# Patient Record
Sex: Female | Born: 1955 | Race: White | Hispanic: No | Smoking: Never smoker
Health system: Southern US, Community
[De-identification: ages and names within clinical notes are randomized; demographics above are authoritative.]

## PROBLEM LIST (undated history)

## (undated) DIAGNOSIS — J189 Pneumonia, unspecified organism: Secondary | ICD-10-CM

## (undated) DIAGNOSIS — K760 Fatty (change of) liver, not elsewhere classified: Secondary | ICD-10-CM

## (undated) DIAGNOSIS — R519 Headache, unspecified: Secondary | ICD-10-CM

## (undated) DIAGNOSIS — IMO0002 Reserved for concepts with insufficient information to code with codable children: Secondary | ICD-10-CM

## (undated) DIAGNOSIS — Z86718 Personal history of other venous thrombosis and embolism: Secondary | ICD-10-CM

## (undated) DIAGNOSIS — R0600 Dyspnea, unspecified: Secondary | ICD-10-CM

## (undated) DIAGNOSIS — I509 Heart failure, unspecified: Secondary | ICD-10-CM

## (undated) DIAGNOSIS — M8580 Other specified disorders of bone density and structure, unspecified site: Secondary | ICD-10-CM

## (undated) DIAGNOSIS — I739 Peripheral vascular disease, unspecified: Secondary | ICD-10-CM

## (undated) DIAGNOSIS — N189 Chronic kidney disease, unspecified: Secondary | ICD-10-CM

## (undated) DIAGNOSIS — Z8679 Personal history of other diseases of the circulatory system: Secondary | ICD-10-CM

## (undated) DIAGNOSIS — D649 Anemia, unspecified: Secondary | ICD-10-CM

## (undated) DIAGNOSIS — E119 Type 2 diabetes mellitus without complications: Secondary | ICD-10-CM

## (undated) DIAGNOSIS — A319 Mycobacterial infection, unspecified: Secondary | ICD-10-CM

## (undated) DIAGNOSIS — M329 Systemic lupus erythematosus, unspecified: Secondary | ICD-10-CM

## (undated) DIAGNOSIS — M1990 Unspecified osteoarthritis, unspecified site: Secondary | ICD-10-CM

## (undated) DIAGNOSIS — E785 Hyperlipidemia, unspecified: Secondary | ICD-10-CM

## (undated) DIAGNOSIS — M3500 Sicca syndrome, unspecified: Secondary | ICD-10-CM

## (undated) DIAGNOSIS — I10 Essential (primary) hypertension: Secondary | ICD-10-CM

## (undated) HISTORY — DX: Sjogren syndrome, unspecified: M35.00

## (undated) HISTORY — DX: Headache, unspecified: R51.9

## (undated) HISTORY — PX: SKIN BIOPSY: SHX1

## (undated) HISTORY — PX: ABDOMINAL HYSTERECTOMY: SHX81

## (undated) HISTORY — DX: Reserved for concepts with insufficient information to code with codable children: IMO0002

## (undated) HISTORY — DX: Systemic lupus erythematosus, unspecified: M32.9

## (undated) HISTORY — PX: EYE SURGERY: SHX253

## (undated) HISTORY — PX: OTHER SURGICAL HISTORY: SHX169

## (undated) HISTORY — DX: Hyperlipidemia, unspecified: E78.5

---

## 2013-10-14 HISTORY — PX: SINUS SURGERY WITH INSTATRAK: SHX5215

## 2015-05-26 HISTORY — PX: COLONOSCOPY: SHX174

## 2016-06-12 ENCOUNTER — Telehealth: Payer: Self-pay

## 2016-06-12 DIAGNOSIS — R062 Wheezing: Secondary | ICD-10-CM | POA: Insufficient documentation

## 2016-06-12 DIAGNOSIS — R0602 Shortness of breath: Secondary | ICD-10-CM | POA: Insufficient documentation

## 2016-06-12 DIAGNOSIS — E785 Hyperlipidemia, unspecified: Secondary | ICD-10-CM | POA: Insufficient documentation

## 2016-06-12 DIAGNOSIS — R609 Edema, unspecified: Secondary | ICD-10-CM | POA: Insufficient documentation

## 2016-06-12 DIAGNOSIS — H9203 Otalgia, bilateral: Secondary | ICD-10-CM

## 2016-06-12 DIAGNOSIS — R630 Anorexia: Secondary | ICD-10-CM | POA: Insufficient documentation

## 2016-06-12 DIAGNOSIS — H9209 Otalgia, unspecified ear: Secondary | ICD-10-CM | POA: Insufficient documentation

## 2016-06-13 ENCOUNTER — Encounter (INDEPENDENT_AMBULATORY_CARE_PROVIDER_SITE_OTHER): Payer: Self-pay

## 2016-06-13 ENCOUNTER — Ambulatory Visit (INDEPENDENT_AMBULATORY_CARE_PROVIDER_SITE_OTHER): Payer: 59 | Admitting: Cardiovascular Disease

## 2016-06-13 ENCOUNTER — Encounter: Payer: Self-pay | Admitting: Cardiovascular Disease

## 2016-06-13 VITALS — BP 124/80 | HR 89 | Ht 65.0 in | Wt 233.0 lb

## 2016-06-13 DIAGNOSIS — I509 Heart failure, unspecified: Secondary | ICD-10-CM

## 2016-06-13 DIAGNOSIS — R0602 Shortness of breath: Secondary | ICD-10-CM

## 2016-06-13 NOTE — Patient Instructions (Signed)
Your physician recommends that you schedule a follow-up appointment in:  After echo and we get records from Dr Raynelle Jan   Your physician has requested that you have an echocardiogram. Echocardiography is a painless test that uses sound waves to create images of your heart. It provides your doctor with information about the size and shape of your heart and how well your heart's chambers and valves are working. This procedure takes approximately one hour. There are no restrictions for this procedure.      Thank you for choosing [facility] !

## 2016-06-13 NOTE — Progress Notes (Signed)
Cardiology Office Note   Date:  06/13/2016   ID:  Joy Leach, DOB August 01, 1956, MRN [id number]  PCP:  No primary care provider on file.  Cardiologist:   Jenkins Rouge, MD   No chief complaint on file.     History of Present Illness: Joy Leach is a 60 y.o. female who presents for evaluation of edema, dyspnea and ? CHF.  Been ill since Beginning of month saw primary with CXR showing pulmonary congestion and small left pleural effusion. Started On lasix with some improvement.   Labs remarkable for cr 1.0 BUN 15 K 3.7 Albumin 4.3 LDL 44 TSH 3.5 A1c 6   She has seen Dr Joy Leach for Rheumatology and indicates she has lupus but her sister said Lab work was normal. She does get "butterfly" rash on face in sun. Knee joints hurt the most  No chest pain palpitations or syncope. No previous history of MI valve disease no diet medication Diet with moderate salt Started prednisone and plaquenil for "Lupus" in March       Past Medical History:  Diagnosis Date  . Hyperlipidemia     No past surgical history on file.   Current Outpatient Prescriptions  Medication Sig Dispense Refill  . atorvastatin (LIPITOR) 10 MG tablet Take 10 mg by mouth daily.    . Cholecalciferol (VITAMIN D3) 3000 units TABS Take 50,000 Units by mouth daily.    . furosemide (LASIX) 20 MG tablet Take 20 mg by mouth daily.    Marland Kitchen loperamide (IMODIUM A-D) 2 MG tablet Take 2 mg by mouth 2 (two) times daily.    Marland Kitchen losartan (COZAAR) 50 MG tablet Take 50 mg by mouth daily.    . Multiple Vitamin (MULTIVITAMIN) tablet Take 1 tablet by mouth daily.    . Omega-3 Fatty Acids (FISH OIL) 1000 MG CAPS Take 1,000 mg by mouth 2 (two) times daily.    Marland Kitchen omeprazole (PRILOSEC) 20 MG capsule Take 20 mg by mouth daily.    . pilocarpine (SALAGEN) 5 MG tablet Take 5 mg by mouth 3 (three) times daily.    . predniSONE (DELTASONE) 10 MG tablet Take 10 mg by mouth daily with breakfast.     No current facility-administered medications  for this visit.     Allergies:   Acetaminophen    Social History:   The patient is single Here with her sisters. Lives in [place] Sedentary No smoking or ETOH    Family History:  The patient's no history of DCM CHF or Lupus on either side of family is    ROS:  Please see the history of present illness.   Otherwise, review of systems are positive for none.   All other systems are reviewed and negative.    PHYSICAL EXAM: VS:  There were no vitals taken for this visit. , BMI There is no height or weight on file to calculate BMI. Affect appropriate Obese white female  HEENT: normal Neck supple with no adenopathy JVP normal no bruits no thyromegaly Lungs clear with no wheezing and good diaphragmatic motion Heart:  S1/S2 no murmur, no rub, gallop or click PMI normal Abdomen: benighn, BS positve, no tenderness, no AAA no bruit.  No HSM or HJR Distal pulses intact with no bruits Trace edema Neuro non-focal Skin warm and dry No muscular weakness    EKG:   SR rate 86  Normal    Recent Labs: No results found for requested labs within last 8760 hours.    Lipid  Panel No results found for: CHOL, TRIG, HDL, CHOLHDL, VLDL, LDLCALC, LDLDIRECT    Wt Readings from Last 3 Encounters:  05/21/16 229 lb 6.4 oz (104.1 kg)      Other studies Reviewed: Additional studies/ records that were reviewed today include: Notes Dr Joy Leach in [place]:  1.  Edema:  Etiology not clear.  Obesity and beginning prednisone can contribute. Exam and ECG are normal Will order echo to evaluate systolic and diastolic function and assess PA pressure 2. Connective Tissue Dx:  ? Lupus will try to get blood work from rheumatologist.  Certainly could contribute to Diastolic dysfunction. She is a large woman and very claustrophobic so would not be a candidate for cardiac MRI At this time. Try to decrease prednisone dose and consider referall to tertiary center for  definitive diagnosis  3.    Current medicines are reviewed at length with the patient today.  The patient does not have concerns regarding medicines.  The following changes have been made:  no change  Labs/ tests ordered today include: Echo  No orders of the defined types were placed in this encounter.    Disposition:   FU with NP in [place] post echo and after records from rheum received Dr Joy Leach     Signed, Jenkins Rouge, MD  06/13/2016 10:05 AM    Joy Leach, [place], [place]  [address] Phone: [phone]; Fax: [phone]

## 2016-06-28 ENCOUNTER — Ambulatory Visit (HOSPITAL_COMMUNITY)
Admission: RE | Admit: 2016-06-28 | Discharge: 2016-06-28 | Disposition: A | Discharge status: Home / Self Care | Payer: 59 | Source: Ambulatory Visit | Attending: Cardiovascular Disease | Admitting: Cardiovascular Disease

## 2016-06-28 DIAGNOSIS — I509 Heart failure, unspecified: Secondary | ICD-10-CM | POA: Diagnosis not present

## 2016-06-28 DIAGNOSIS — I340 Nonrheumatic mitral (valve) insufficiency: Secondary | ICD-10-CM | POA: Diagnosis not present

## 2016-06-28 NOTE — Progress Notes (Signed)
  Echocardiogram 2D Echocardiogram has been performed.  Darlina Sicilian M 06/28/2016, 9:59 AM

## 2016-07-01 ENCOUNTER — Telehealth: Payer: Self-pay | Admitting: Cardiovascular Disease

## 2016-07-01 NOTE — Telephone Encounter (Signed)
Pt is returning someone's call.

## 2016-07-01 NOTE — Telephone Encounter (Signed)
[facility] attempted to call pt,she will call them

## 2016-07-12 ENCOUNTER — Encounter: Payer: Self-pay | Admitting: Adult Health

## 2016-07-12 ENCOUNTER — Ambulatory Visit (INDEPENDENT_AMBULATORY_CARE_PROVIDER_SITE_OTHER): Payer: 59 | Admitting: Adult Health

## 2016-07-12 VITALS — BP 132/82 | HR 95 | Ht 65.0 in | Wt 234.0 lb

## 2016-07-12 DIAGNOSIS — I10 Essential (primary) hypertension: Secondary | ICD-10-CM

## 2016-07-12 DIAGNOSIS — E7800 Pure hypercholesterolemia, unspecified: Secondary | ICD-10-CM | POA: Diagnosis not present

## 2016-07-12 DIAGNOSIS — R0602 Shortness of breath: Secondary | ICD-10-CM

## 2016-07-12 NOTE — Patient Instructions (Signed)
Medication Instructions:  TAKE LASIX AS NEEDED ( Tuesday/FRIDAY )   Labwork: Your physician recommends that you return for lab work in: TODAY  BMET TSH MAGNESIUM    Testing/Procedures: Your physician has recommended that you have a pulmonary function test. Pulmonary Function Tests are a group of tests that measure how well air moves in and out of your lungs.   Your physician has recommended that you have a sleep study. This test records several body functions during sleep, including: brain activity, eye movement, oxygen and carbon dioxide blood levels, heart rate and rhythm, breathing rate and rhythm, the flow of air through your mouth and nose, snoring, body muscle movements, and chest and belly movement.    Follow-Up: Your physician recommends that you schedule a follow-up appointment in: 1 MONTH    Any Other Special Instructions Will Be Listed Below (If Applicable).     If you need a refill on your cardiac medications before your next appointment, please call your pharmacy.

## 2016-07-12 NOTE — Progress Notes (Signed)
Cardiology Office Note   Date:  07/12/2016   ID:  Joy Leach, DOB 1956/03/08, MRN [id number]  PCP:  PROVIDER NOT IN SYSTEM  Cardiologist: Nishan/  Jory Sims, NP   Chief Complaint  Patient presents with  . Congestive Heart Failure  . Shortness of Breath      History of Present Illness: Joy Leach is a 60 y.o. female who presents for ongoing assessment and management of dyspnea, questional CHF with complaints of edema. The patient was last seen by Dr. Johnsie Cancel on 06/13/2016 with an echocardiogram planned. It is recommended that she decrease her prednisone dosing but has been deferred to her rheumatologist to make this adjustments.  Left ventricle: The cavity size was normal. Wall thickness was   normal. Doppler parameters are consistent with abnormal left   ventricular relaxation (grade 1 diastolic dysfunction). - Mitral valve: There was mild regurgitation.  Joy Leach, comes today tearful and frustrated. She continues to have complaints of dyspnea on exertion and mild chest pressure. She states that she does not understand why all of her tests are coming back negative and no one has found out what is causing her symptoms. She is with her family members who are also frustrated.  Past Medical History:  Diagnosis Date  . Hyperlipidemia     History reviewed. No pertinent surgical history.   Current Outpatient Prescriptions  Medication Sig Dispense Refill  . atorvastatin (LIPITOR) 10 MG tablet Take 10 mg by mouth daily.    . calcium-vitamin D (OSCAL WITH D) 500-200 MG-UNIT tablet Take 1 tablet by mouth.    . Cholecalciferol (VITAMIN D3) 3000 units TABS Take 50,000 Units by mouth daily.    . furosemide (LASIX) 20 MG tablet Take 20 mg by mouth daily.    . hydroxychloroquine (PLAQUENIL) 200 MG tablet Take 400 mg by mouth daily.    Marland Kitchen loperamide (IMODIUM A-D) 2 MG tablet Take 2 mg by mouth 2 (two) times daily.    Marland Kitchen losartan (COZAAR) 50 MG tablet Take 50 mg by mouth daily.     . Multiple Vitamin (MULTIVITAMIN) tablet Take 1 tablet by mouth daily.    . Omega-3 Fatty Acids (FISH OIL) 1000 MG CAPS Take 1,000 mg by mouth 2 (two) times daily.    Marland Kitchen omeprazole (PRILOSEC) 20 MG capsule Take 20 mg by mouth daily.    . pilocarpine (SALAGEN) 5 MG tablet Take 5 mg by mouth 3 (three) times daily.    . predniSONE (DELTASONE) 10 MG tablet Take 10 mg by mouth daily with breakfast.     No current facility-administered medications for this visit.     Allergies:   Sulfa antibiotics and Codeine    Social History:  The patient  reports that she has never smoked. She has never used smokeless tobacco. She reports that she does not drink alcohol or use drugs.   Family History:  The patient's family history includes Diabetes in her father and mother; Heart disease in her father and mother; Hypertension in her father and mother; Kidney disease in her father and mother.    ROS: All other systems are reviewed and negative. Unless otherwise mentioned in H&P    PHYSICAL EXAM: VS:  BP 132/82   Pulse 95   Ht 5\' 5"  (1.651 m)   Wt 234 lb (106.1 kg)   SpO2 96%   BMI 38.94 kg/m  , BMI Body mass index is 38.94 kg/m. GEN: Well nourished, well developed, in no acute distress  HEENT: normal  Neck:  no JVD, carotid bruits, or masses Cardiac: RRR;tachycardic, no murmurs, rubs, or gallops, mild, nonpitting edema  Respiratory: Clear to auscultation bilaterally, normal work of breathing GI: soft, nontender, nondistended, + BS MS: no deformity or atrophy  Skin: warm and dry, no rash Neuro:  Strength and sensation are intact Psych: euthymic mood, full affect  Recent Labs: No results found for requested labs within last 8760 hours.    Lipid Panel No results found for: CHOL, TRIG, HDL, CHOLHDL, VLDL, LDLCALC, LDLDIRECT    Wt Readings from Last 3 Encounters:  07/12/16 234 lb (106.1 kg)  06/13/16 233 lb (105.7 kg)  05/21/16 229 lb 6.4 oz (104.1 kg)     ASSESSMENT AND PLAN:  1.   Chronic dyspnea: she continues to have generalized fatigue and dyspnea with minimal exertion. Echo is reassuring. I will schedule her for pulmonary function tests and a sleep study. She snores heavily, is often fatigued, and despite multiple testing she has been ruled out for cardiac etiology for her breathing insufficiency.  2. Hypertension:blood pressures currently well-controlled. Will not make any changes. We'll order a BMET and a magnesium along with a TSH.she will use Lasix when necessary assisted daily.  3. Hyperlipidemia:he patient will continue statin therapy.  4. Questionable Lupus:  She is being followed by rheumatologist for ongoing evaluation and management. Remains on prednisone.   Current medicines are reviewed at length with the patient today.    Labs/ tests ordered today include: BMET, magnesium, TSH, sleep study, and PFTs No orders of the defined types were placed in this encounter.    Disposition:   FU with one month.  Signed, Jory Sims, NP  07/12/2016 1:29 PM    [facility] [address] [place], [place] [address] Phone: [phone]; Fax: [phone]

## 2016-07-12 NOTE — Progress Notes (Signed)
Name: Devani Rain    DOB: August 27, 1956  Age: 60 y.o.  MR#: [id number]       PCP:  PROVIDER NOT IN SYSTEM      Insurance: Payor: [employer] / Plan: [employer] OTHER / Product Type: *No Product type* /   CC:   No chief complaint on file.   VS Vitals:   07/12/16 1321  Pulse: 95  SpO2: 96%  Weight: 234 lb (106.1 kg)  Height: 5\' 5"  (1.651 m)    Weights Current Weight  07/12/16 234 lb (106.1 kg)  06/13/16 233 lb (105.7 kg)  05/21/16 229 lb 6.4 oz (104.1 kg)    Blood Pressure  BP Readings from Last 3 Encounters:  06/13/16 124/80  05/21/16 124/68     Admit date:  (Not on file) Last encounter with RMR:  Visit date not found   Allergy Sulfa antibiotics and Codeine  Current Outpatient Prescriptions  Medication Sig Dispense Refill  . atorvastatin (LIPITOR) 10 MG tablet Take 10 mg by mouth daily.    . calcium-vitamin D (OSCAL WITH D) 500-200 MG-UNIT tablet Take 1 tablet by mouth.    . Cholecalciferol (VITAMIN D3) 3000 units TABS Take 50,000 Units by mouth daily.    . furosemide (LASIX) 20 MG tablet Take 20 mg by mouth daily.    . hydroxychloroquine (PLAQUENIL) 200 MG tablet Take 400 mg by mouth daily.    Marland Kitchen loperamide (IMODIUM A-D) 2 MG tablet Take 2 mg by mouth 2 (two) times daily.    Marland Kitchen losartan (COZAAR) 50 MG tablet Take 50 mg by mouth daily.    . Multiple Vitamin (MULTIVITAMIN) tablet Take 1 tablet by mouth daily.    . Omega-3 Fatty Acids (FISH OIL) 1000 MG CAPS Take 1,000 mg by mouth 2 (two) times daily.    Marland Kitchen omeprazole (PRILOSEC) 20 MG capsule Take 20 mg by mouth daily.    . pilocarpine (SALAGEN) 5 MG tablet Take 5 mg by mouth 3 (three) times daily.    . predniSONE (DELTASONE) 10 MG tablet Take 10 mg by mouth daily with breakfast.     No current facility-administered medications for this visit.     Discontinued Meds:   There are no discontinued medications.  Patient Active Problem List   Diagnosis Date Noted  . Hyperlipidemia 06/12/2016  . Anorexia  06/12/2016  . Edema 06/12/2016  . Otalgia 06/12/2016  . Wheezing 06/12/2016  . SOB (shortness of breath) 06/12/2016    LABS No results found for: NA, K, CL, CO2, GLUCOSE, BUN, CREATININE, CALCIUM, GFRNONAA, GFRAA CMP  No results found for: NA, K, CL, CO2, GLUCOSE, BUN, CREATININE, CALCIUM, PROT, ALBUMIN, AST, ALT, ALKPHOS, BILITOT, GFRNONAA, GFRAA  No results found for: WBC, HGB, HCT, MCV  Lipid Panel  No results found for: CHOL, TRIG, HDL, CHOLHDL, VLDL, LDLCALC, LDLDIRECT  ABG No results found for: PHART, PCO2ART, PO2ART, HCO3, TCO2, ACIDBASEDEF, O2SAT   No results found for: TSH BNP (last 3 results) No results for input(s): BNP in the last 8760 hours.  ProBNP (last 3 results) No results for input(s): PROBNP in the last 8760 hours.  Cardiac Panel (last 3 results) No results for input(s): CKTOTAL, CKMB, TROPONINI, RELINDX in the last 72 hours.  Iron/TIBC/Ferritin/ %Sat No results found for: IRON, TIBC, FERRITIN, IRONPCTSAT   EKG Orders placed or performed in visit on 06/13/16  . EKG 12-Lead     Prior Assessment and Plan Problem List as of 07/12/2016 Reviewed: 06/12/2016  3:23 PM by Drema Dallas,  CMA     Other   Hyperlipidemia   Anorexia   Edema   Otalgia   Wheezing   SOB (shortness of breath)       Imaging: No results found.

## 2016-07-13 LAB — BASIC METABOLIC PANEL
BUN: 12 mg/dL (ref 7–25)
CO2: 26 mmol/L (ref 20–31)
Calcium: 9.5 mg/dL (ref 8.6–10.4)
Chloride: 102 mmol/L (ref 98–110)
Creat: 1.01 mg/dL (ref 0.50–1.05)
GLUCOSE: 145 mg/dL — AB (ref 65–99)
POTASSIUM: 4 mmol/L (ref 3.5–5.3)
Sodium: 141 mmol/L (ref 135–146)

## 2016-07-13 LAB — TSH: TSH: 1.13 m[IU]/L

## 2016-07-13 LAB — MAGNESIUM: MAGNESIUM: 2.2 mg/dL (ref 1.5–2.5)

## 2016-07-24 ENCOUNTER — Ambulatory Visit (HOSPITAL_COMMUNITY)
Admission: RE | Admit: 2016-07-24 | Discharge: 2016-07-24 | Disposition: A | Discharge status: Home / Self Care | Payer: 59 | Source: Ambulatory Visit | Attending: Adult Health | Admitting: Adult Health

## 2016-07-24 DIAGNOSIS — J988 Other specified respiratory disorders: Secondary | ICD-10-CM | POA: Diagnosis not present

## 2016-07-24 DIAGNOSIS — R0602 Shortness of breath: Secondary | ICD-10-CM | POA: Insufficient documentation

## 2016-07-24 LAB — PULMONARY FUNCTION TEST
DL/VA % PRED: 85 %
DL/VA: 4.19 ml/min/mmHg/L
DLCO unc % pred: 66 %
DLCO unc: 16.91 ml/min/mmHg
FEF 25-75 POST: 1.83 L/s
FEF 25-75 Pre: 2.02 L/sec
FEF2575-%CHANGE-POST: -9 %
FEF2575-%PRED-PRE: 82 %
FEF2575-%Pred-Post: 75 %
FEV1-%CHANGE-POST: -2 %
FEV1-%PRED-PRE: 67 %
FEV1-%Pred-Post: 65 %
FEV1-PRE: 1.8 L
FEV1-Post: 1.75 L
FEV1FVC-%CHANGE-POST: 3 %
FEV1FVC-%Pred-Pre: 108 %
FEV6-%CHANGE-POST: -6 %
FEV6-%PRED-PRE: 63 %
FEV6-%Pred-Post: 59 %
FEV6-PRE: 2.13 L
FEV6-Post: 1.99 L
FEV6FVC-%PRED-PRE: 103 %
FEV6FVC-%Pred-Post: 103 %
FVC-%CHANGE-POST: -6 %
FVC-%PRED-PRE: 61 %
FVC-%Pred-Post: 57 %
FVC-POST: 1.99 L
FVC-PRE: 2.13 L
POST FEV1/FVC RATIO: 88 %
POST FEV6/FVC RATIO: 100 %
Pre FEV1/FVC ratio: 85 %
Pre FEV6/FVC Ratio: 100 %
RV % pred: 98 %
RV: 2 L
TLC % pred: 91 %
TLC: 4.75 L

## 2016-07-24 MED ORDER — ALBUTEROL SULFATE (2.5 MG/3ML) 0.083% IN NEBU
2.5000 mg | INHALATION_SOLUTION | Freq: Once | RESPIRATORY_TRACT | Status: AC
  Administered 2016-07-24: 2.5 mg via RESPIRATORY_TRACT

## 2016-07-25 ENCOUNTER — Ambulatory Visit: Payer: 59 | Attending: Adult Health | Admitting: Neurology

## 2016-07-25 DIAGNOSIS — G4733 Obstructive sleep apnea (adult) (pediatric): Secondary | ICD-10-CM | POA: Insufficient documentation

## 2016-07-25 DIAGNOSIS — R0602 Shortness of breath: Secondary | ICD-10-CM

## 2016-07-25 DIAGNOSIS — Z79899 Other long term (current) drug therapy: Secondary | ICD-10-CM | POA: Diagnosis not present

## 2016-07-25 DIAGNOSIS — R0683 Snoring: Secondary | ICD-10-CM | POA: Diagnosis present

## 2016-07-26 ENCOUNTER — Telehealth: Payer: Self-pay

## 2016-07-26 NOTE — Telephone Encounter (Signed)
Called pt, no answer- left message for pt to return call.  

## 2016-07-26 NOTE — Telephone Encounter (Signed)
-----   Message from Lendon Colonel, NP sent at 07/25/2016  5:55 PM EDT ----- No significant respiratory abnormalities noted on PFTs. Follow up with PCP for ongoing testing and management.

## 2016-07-29 NOTE — Procedures (Signed)
Orland A. Merlene Laughter, MD     [email or web address]             NOCTURNAL POLYSOMNOGRAPHY   LOCATION: [facility]  Patient Name: Joy Leach, Joy Leach Date: 07/25/2016 Gender: Female D.O.B: Jul 07, 1956 Age (years): 68 Referring Provider: Jory Sims NP Height (inches): 65 Interpreting Physician: Phillips Odor MD, ABSM Weight (lbs): 234 RPSGT: Peak, Robert BMI: 39 MRN: [id number] Neck Size: 17.00 CLINICAL INFORMATION Sleep Study Type: NPSG Indication for sleep study: N/A Epworth Sleepiness Score: 5 SLEEP STUDY TECHNIQUE As per the AASM Manual for the Scoring of Sleep and Associated Events v2.3 (April 2016) with a hypopnea requiring 4% desaturations. The channels recorded and monitored were frontal, central and occipital EEG, electrooculogram (EOG), submentalis EMG (chin), nasal and oral airflow, thoracic and abdominal wall motion, anterior tibialis EMG, snore microphone, electrocardiogram, and pulse oximetry. MEDICATIONS Medications self-administered by patient taken the night of the study : N/A  Current Outpatient Prescriptions:  .  atorvastatin (LIPITOR) 10 MG tablet, Take 10 mg by mouth daily., Disp: , Rfl:  .  calcium-vitamin D (OSCAL WITH D) 500-200 MG-UNIT tablet, Take 1 tablet by mouth., Disp: , Rfl:  .  Cholecalciferol (VITAMIN D3) 3000 units TABS, Take 50,000 Units by mouth daily., Disp: , Rfl:  .  furosemide (LASIX) 20 MG tablet, Take 20 mg by mouth daily. DAILY AS NEEDED , Disp: , Rfl:  .  hydroxychloroquine (PLAQUENIL) 200 MG tablet, Take 400 mg by mouth daily., Disp: , Rfl:  .  loperamide (IMODIUM A-D) 2 MG tablet, Take 2 mg by mouth 2 (two) times daily., Disp: , Rfl:  .  losartan (COZAAR) 50 MG tablet, Take 50 mg by mouth daily., Disp: , Rfl:  .  Multiple Vitamin (MULTIVITAMIN) tablet, Take 1 tablet by mouth daily., Disp: , Rfl:  .  Omega-3 Fatty Acids (FISH OIL) 1000 MG CAPS, Take 1,000 mg by mouth 2 (two) times daily., Disp: , Rfl:  .   omeprazole (PRILOSEC) 20 MG capsule, Take 20 mg by mouth daily., Disp: , Rfl:  .  pilocarpine (SALAGEN) 5 MG tablet, Take 5 mg by mouth 3 (three) times daily., Disp: , Rfl:  .  predniSONE (DELTASONE) 10 MG tablet, Take 10 mg by mouth daily with breakfast., Disp: , Rfl:   SLEEP ARCHITECTURE The study was initiated at 10:07:37 PM and ended at 5:01:12 AM. Sleep onset time was 88.0 minutes and the sleep efficiency was 33.0%. The total sleep time was 136.5 minutes. Stage REM latency was N/A minutes. The patient spent 69.23% of the night in stage N1 sleep, 30.77% in stage N2 sleep, 0.00% in stage N3 and 0.00% in REM. Alpha intrusion was absent. Supine sleep was 54.95%. RESPIRATORY PARAMETERS The overall apnea/hypopnea index (AHI) was 5.7 per hour. There were 2 total apneas, including 0 obstructive, 2 central and 0 mixed apneas. There were 11 hypopneas and 63 RERAs. The AHI during Stage REM sleep was N/A per hour. AHI while supine was 10.4 per hour. The mean oxygen saturation was 92.46%. The minimum SpO2 during sleep was 86.00%. Loud snoring was noted during this study. CARDIAC DATA The 2 lead EKG demonstrated sinus rhythm. The mean heart rate was 69.67 beats per minute. Other EKG findings include: None. LEG MOVEMENT DATA The total PLMS were 0 with a resulting PLMS index of 0.00. Associated arousal with leg movement index was 0.0.    IMPRESSIONS - This recording reveals mild obstructive sleep apnea syndrome that does not require positive pressure treatment. - Abnormal sleep architecture  with poor sleep efficiency, absent slow-wave sleep, absent REM sleep and sleep fragmentation.   Delano Metz, MD Diplomate, American Board of Sleep Medicine.

## 2016-07-29 NOTE — Progress Notes (Signed)
Tribune A. Merlene Laughter, MD     [email or web address]             NOCTURNAL POLYSOMNOGRAPHY   LOCATION: [facility]  Patient Name: Joy Leach, Joy Leach Date: 07/25/2016 Gender: Female D.O.B: 11/08/1955 Age (years): 68 Referring Provider: Jory Sims NP Height (inches): 65 Interpreting Physician: Phillips Odor MD, ABSM Weight (lbs): 234 RPSGT: Peak, Robert BMI: 39 MRN: [id number] Neck Size: 17.00 CLINICAL INFORMATION Sleep Study Type: NPSG Indication for sleep study: N/A Epworth Sleepiness Score: 5 SLEEP STUDY TECHNIQUE As per the AASM Manual for the Scoring of Sleep and Associated Events v2.3 (April 2016) with a hypopnea requiring 4% desaturations. The channels recorded and monitored were frontal, central and occipital EEG, electrooculogram (EOG), submentalis EMG (chin), nasal and oral airflow, thoracic and abdominal wall motion, anterior tibialis EMG, snore microphone, electrocardiogram, and pulse oximetry. MEDICATIONS Medications self-administered by patient taken the night of the study : N/A  Current Outpatient Prescriptions:  .  atorvastatin (LIPITOR) 10 MG tablet, Take 10 mg by mouth daily., Disp: , Rfl:  .  calcium-vitamin D (OSCAL WITH D) 500-200 MG-UNIT tablet, Take 1 tablet by mouth., Disp: , Rfl:  .  Cholecalciferol (VITAMIN D3) 3000 units TABS, Take 50,000 Units by mouth daily., Disp: , Rfl:  .  furosemide (LASIX) 20 MG tablet, Take 20 mg by mouth daily. DAILY AS NEEDED , Disp: , Rfl:  .  hydroxychloroquine (PLAQUENIL) 200 MG tablet, Take 400 mg by mouth daily., Disp: , Rfl:  .  loperamide (IMODIUM A-D) 2 MG tablet, Take 2 mg by mouth 2 (two) times daily., Disp: , Rfl:  .  losartan (COZAAR) 50 MG tablet, Take 50 mg by mouth daily., Disp: , Rfl:  .  Multiple Vitamin (MULTIVITAMIN) tablet, Take 1 tablet by mouth daily., Disp: , Rfl:  .  Omega-3 Fatty Acids (FISH OIL) 1000 MG CAPS, Take 1,000 mg by mouth 2 (two) times daily., Disp: , Rfl:  .   omeprazole (PRILOSEC) 20 MG capsule, Take 20 mg by mouth daily., Disp: , Rfl:  .  pilocarpine (SALAGEN) 5 MG tablet, Take 5 mg by mouth 3 (three) times daily., Disp: , Rfl:  .  predniSONE (DELTASONE) 10 MG tablet, Take 10 mg by mouth daily with breakfast., Disp: , Rfl:   SLEEP ARCHITECTURE The study was initiated at 10:07:37 PM and ended at 5:01:12 AM. Sleep onset time was 88.0 minutes and the sleep efficiency was 33.0%. The total sleep time was 136.5 minutes. Stage REM latency was N/A minutes. The patient spent 69.23% of the night in stage N1 sleep, 30.77% in stage N2 sleep, 0.00% in stage N3 and 0.00% in REM. Alpha intrusion was absent. Supine sleep was 54.95%. RESPIRATORY PARAMETERS The overall apnea/hypopnea index (AHI) was 5.7 per hour. There were 2 total apneas, including 0 obstructive, 2 central and 0 mixed apneas. There were 11 hypopneas and 63 RERAs. The AHI during Stage REM sleep was N/A per hour. AHI while supine was 10.4 per hour. The mean oxygen saturation was 92.46%. The minimum SpO2 during sleep was 86.00%. Loud snoring was noted during this study. CARDIAC DATA The 2 lead EKG demonstrated sinus rhythm. The mean heart rate was 69.67 beats per minute. Other EKG findings include: None. LEG MOVEMENT DATA The total PLMS were 0 with a resulting PLMS index of 0.00. Associated arousal with leg movement index was 0.0.   IMPRESSIONS - This recording reveals mild obstructive sleep apnea syndrome that does not require positive pressure treatment. - Abnormal sleep architecture with  poor sleep efficiency, absent slow-wave sleep, absent REM sleep and sleep fragmentation.   Delano Metz, MD Diplomate, American Board of Sleep Medicine.

## 2016-08-12 ENCOUNTER — Ambulatory Visit: Payer: 59 | Admitting: Adult Health

## 2016-08-12 ENCOUNTER — Telehealth: Payer: Self-pay | Admitting: Adult Health

## 2016-08-12 NOTE — Telephone Encounter (Signed)
Pt would like to know her sleep study results

## 2016-08-12 NOTE — Telephone Encounter (Signed)
Pt calling for sleep study,will inform requesting provider

## 2016-08-12 NOTE — Telephone Encounter (Signed)
Per NP study completed,not read, will call dr Freddie Apley office tomorrow to check

## 2016-08-13 ENCOUNTER — Telehealth: Payer: Self-pay

## 2016-08-13 NOTE — Telephone Encounter (Signed)
-----   Message from Lendon Colonel, NP sent at 08/12/2016  4:17 PM EDT ----- Regarding: Sleep study I see that sleep study completed. Has not been read yet. As soon as available will call results

## 2016-08-13 NOTE — Telephone Encounter (Signed)
Results under procedures in EPIC

## 2016-08-13 NOTE — Telephone Encounter (Signed)
LM with Raquel Sarna ,office manager to get results of sleep study

## 2016-08-19 NOTE — Progress Notes (Signed)
Cardiology Office Note   Date:  08/20/2016   ID:  Joy Leach, DOB 02-10-1956, MRN [id number]  PCP:  PROVIDER NOT IN SYSTEM  Cardiologist: Tramaine Sauls/  Jenkins Rouge, MD   No chief complaint on file.     History of Present Illness: Joy Leach is a 60 y.o. female who presents for ongoing assessment and management of dyspnea, questional CHF with complaints of edema. The patient was last seen by me on 06/13/2016 with an echocardiogram planned. It is recommended that she decrease her prednisone dosing but has been deferred to her rheumatologist to make this adjustments.  Left ventricle: The cavity size was normal. Wall thickness was   normal. Doppler parameters are consistent with abnormal left   ventricular relaxation (grade 1 diastolic dysfunction). - Mitral valve: There was mild regurgitation.  Sleep study showed mild obstructive sleep apnea that does not require  CPAP Still dyspnea and frustrated at not feeling well   Past Medical History:  Diagnosis Date  . Hyperlipidemia     Past Surgical History:  Procedure Laterality Date  . hystorectomy    . SINUS SURGERY WITH INSTATRAK       Current Outpatient Prescriptions  Medication Sig Dispense Refill  . atorvastatin (LIPITOR) 10 MG tablet Take 10 mg by mouth daily.    . calcium-vitamin D (OSCAL WITH D) 500-200 MG-UNIT tablet Take 1 tablet by mouth.    . Cholecalciferol (VITAMIN D3) 3000 units TABS Take 50,000 Units by mouth daily.    . furosemide (LASIX) 20 MG tablet Take 1 tablet (20 mg total) by mouth daily. DAILY AS NEEDED 30 tablet 3  . hydroxychloroquine (PLAQUENIL) 200 MG tablet Take 400 mg by mouth daily.    Marland Kitchen loperamide (IMODIUM A-D) 2 MG tablet Take 2 mg by mouth 2 (two) times daily.    Marland Kitchen losartan (COZAAR) 50 MG tablet Take 1 tablet (50 mg total) by mouth daily. 30 tablet 3  . Multiple Vitamin (MULTIVITAMIN) tablet Take 1 tablet by mouth daily.    . Omega-3 Fatty Acids (FISH OIL) 1000 MG CAPS Take 1,000 mg by mouth 2  (two) times daily.    Marland Kitchen omeprazole (PRILOSEC) 20 MG capsule Take 20 mg by mouth daily.    . pilocarpine (SALAGEN) 5 MG tablet Take 5 mg by mouth 3 (three) times daily.    . predniSONE (DELTASONE) 10 MG tablet Take 10 mg by mouth daily with breakfast.     No current facility-administered medications for this visit.     Allergies:   Sulfa antibiotics and Codeine    Social History:  The patient  reports that she has never smoked. She has never used smokeless tobacco. She reports that she does not drink alcohol or use drugs.   Family History:  The patient's family history includes Diabetes in her father and mother; Heart disease in her father and mother; Hypertension in her father and mother; Kidney disease in her father and mother.    ROS: All other systems are reviewed and negative. Unless otherwise mentioned in H&P    PHYSICAL EXAM: VS:  BP 138/88   Pulse (!) 110   Ht 5\' 5"  (1.651 m)   Wt 105.7 kg (233 lb)   SpO2 95%   BMI 38.77 kg/m  , BMI Body mass index is 38.77 kg/m. GEN: Well nourished, well developed, in no acute distress  HEENT: normal  Neck: no JVD, carotid bruits, or masses Cardiac: RRR;tachycardic, no murmurs, rubs, or gallops, mild, nonpitting edema  Respiratory: Clear to  auscultation bilaterally, normal work of breathing GI: soft, nontender, nondistended, + BS MS: no deformity or atrophy  Skin: warm and dry, no rash Neuro:  Strength and sensation are intact Psych: euthymic mood, full affect  Recent Labs: 07/12/2016: BUN 12; Creat 1.01; Magnesium 2.2; Potassium 4.0; Sodium 141; TSH 1.13    Lipid Panel No results found for: CHOL, TRIG, HDL, CHOLHDL, VLDL, LDLCALC, LDLDIRECT    Wt Readings from Last 3 Encounters:  08/20/16 105.7 kg (233 lb)  07/25/16 106.1 kg (234 lb)  07/12/16 106.1 kg (234 lb)     ASSESSMENT AND PLAN:  1.  Chronic dyspnea: she continues to have generalized fatigue and dyspnea with minimal exertion. . She snores heavily, is often  fatigued, and despite multiple testing she has been ruled out for cardiac etiology for her breathing insufficiency. Will order CTA to r/o PE and stress myovue to r/o CAD and dyspnea as anginal equivalent  2. Hypertension:blood pressures currently well-controlled. Will not make any changes.   3. Hyperlipidemia:he patient will continue statin therapy.  4. Questionable Lupus:  She is being followed by rheumatologist for ongoing evaluation and management. Remains on prednisone.   Current medicines are reviewed at length with the patient today.    Labs/ tests ordered today include: BMET, magnesium, TSH, sleep study, and PFTs  Orders Placed This Encounter  Procedures  . CT Angio Chest/Abd/Pel for Dissection W and/or W/WO  . NM Myocar Multi W/Spect W/Wall Motion / EF  . Basic Metabolic Panel (BMET)     Jenkins Rouge

## 2016-08-20 ENCOUNTER — Encounter: Payer: Self-pay | Admitting: Cardiovascular Disease

## 2016-08-20 ENCOUNTER — Ambulatory Visit (INDEPENDENT_AMBULATORY_CARE_PROVIDER_SITE_OTHER): Payer: 59 | Admitting: Cardiovascular Disease

## 2016-08-20 VITALS — BP 138/88 | HR 110 | Ht 65.0 in | Wt 233.0 lb

## 2016-08-20 DIAGNOSIS — R079 Chest pain, unspecified: Secondary | ICD-10-CM | POA: Diagnosis not present

## 2016-08-20 DIAGNOSIS — R0600 Dyspnea, unspecified: Secondary | ICD-10-CM | POA: Diagnosis not present

## 2016-08-20 MED ORDER — FUROSEMIDE 20 MG PO TABS: 20.0000 mg | ORAL_TABLET | Freq: Every day | ORAL | 3 refills | Status: DC

## 2016-08-20 MED ORDER — LOSARTAN POTASSIUM 50 MG PO TABS: 50.0000 mg | ORAL_TABLET | Freq: Every day | ORAL | 3 refills | Status: DC

## 2016-08-20 NOTE — Patient Instructions (Signed)
Medication Instructions:  Your physician recommends that you continue on your current medications as directed. Please refer to the Current Medication list given to you today.   Labwork: Your physician recommends that you return for lab work in: today  Testing/Procedures: Your physician has requested that you have en exercise stress myoview. For further information please visit [email or web address]. Please follow instruction sheet, as given.  Non-Cardiac CT Angiography (CTA), is a special type of CT scan that uses a computer to produce multi-dimensional views of major blood vessels throughout the body. In CT angiography, a contrast material is injected through an IV to help visualize the blood vessels   Follow-Up: Your physician recommends that you schedule a follow-up appointment in: as needed    Any Other Special Instructions Will Be Listed Below (If Applicable).     If you need a refill on your cardiac medications before your next appointment, please call your pharmacy.

## 2016-08-21 LAB — BASIC METABOLIC PANEL
BUN: 15 mg/dL (ref 7–25)
CALCIUM: 9.8 mg/dL (ref 8.6–10.4)
CO2: 29 mmol/L (ref 20–31)
Chloride: 99 mmol/L (ref 98–110)
Creat: 1.06 mg/dL — ABNORMAL HIGH (ref 0.50–1.05)
GLUCOSE: 179 mg/dL — AB (ref 65–99)
POTASSIUM: 3.8 mmol/L (ref 3.5–5.3)
SODIUM: 140 mmol/L (ref 135–146)

## 2016-08-27 ENCOUNTER — Other Ambulatory Visit: Payer: Self-pay | Admitting: Cardiovascular Disease

## 2016-08-27 ENCOUNTER — Other Ambulatory Visit: Payer: Self-pay

## 2016-08-27 DIAGNOSIS — R079 Chest pain, unspecified: Secondary | ICD-10-CM

## 2016-08-27 DIAGNOSIS — R0600 Dyspnea, unspecified: Secondary | ICD-10-CM

## 2016-08-29 ENCOUNTER — Encounter (HOSPITAL_COMMUNITY)
Admission: RE | Admit: 2016-08-29 | Discharge: 2016-08-29 | Disposition: A | Discharge status: Home / Self Care | Payer: 59 | Source: Ambulatory Visit | Attending: Adult Health | Admitting: Adult Health

## 2016-08-29 ENCOUNTER — Encounter (HOSPITAL_COMMUNITY): Payer: Self-pay

## 2016-08-29 ENCOUNTER — Telehealth: Payer: Self-pay

## 2016-08-29 ENCOUNTER — Ambulatory Visit (HOSPITAL_COMMUNITY)
Admission: RE | Admit: 2016-08-29 | Discharge: 2016-08-29 | Disposition: A | Discharge status: Home / Self Care | Payer: 59 | Source: Ambulatory Visit | Attending: Adult Health | Admitting: Adult Health

## 2016-08-29 ENCOUNTER — Encounter (HOSPITAL_BASED_OUTPATIENT_CLINIC_OR_DEPARTMENT_OTHER)
Admission: RE | Admit: 2016-08-29 | Discharge: 2016-08-29 | Disposition: A | Discharge status: Home / Self Care | Payer: 59 | Source: Ambulatory Visit | Attending: Adult Health | Admitting: Adult Health

## 2016-08-29 ENCOUNTER — Inpatient Hospital Stay (HOSPITAL_COMMUNITY): Admission: RE | Admit: 2016-08-29 | Payer: 59 | Source: Ambulatory Visit

## 2016-08-29 DIAGNOSIS — L988 Other specified disorders of the skin and subcutaneous tissue: Secondary | ICD-10-CM | POA: Diagnosis not present

## 2016-08-29 DIAGNOSIS — R079 Chest pain, unspecified: Secondary | ICD-10-CM | POA: Diagnosis present

## 2016-08-29 DIAGNOSIS — M329 Systemic lupus erythematosus, unspecified: Secondary | ICD-10-CM | POA: Diagnosis not present

## 2016-08-29 DIAGNOSIS — R0600 Dyspnea, unspecified: Secondary | ICD-10-CM | POA: Diagnosis present

## 2016-08-29 DIAGNOSIS — R0602 Shortness of breath: Secondary | ICD-10-CM

## 2016-08-29 HISTORY — DX: Heart failure, unspecified: I50.9

## 2016-08-29 HISTORY — DX: Essential (primary) hypertension: I10

## 2016-08-29 LAB — NM MYOCAR MULTI W/SPECT W/WALL MOTION / EF
CHL CUP MPHR: 161 {beats}/min
CHL CUP NUCLEAR SDS: 8
CHL CUP NUCLEAR SRS: 4
CHL CUP NUCLEAR SSS: 12
CSEPED: 5 min
CSEPEW: 7 METS
CSEPHR: 87 %
CSEPPHR: 141 {beats}/min
Exercise duration (sec): 21 s
LHR: 0.05
LV dias vol: 42 mL (ref 46–106)
LV sys vol: 15 mL
NUC STRESS TID: 0.75
RPE: 17
Rest HR: 78 {beats}/min

## 2016-08-29 MED ORDER — SODIUM CHLORIDE 0.9% FLUSH
INTRAVENOUS | Status: AC
  Administered 2016-08-29: 10 mL via INTRAVENOUS
  Filled 2016-08-29: qty 10

## 2016-08-29 MED ORDER — REGADENOSON 0.4 MG/5ML IV SOLN
INTRAVENOUS | Status: AC
  Filled 2016-08-29: qty 5

## 2016-08-29 MED ORDER — TECHNETIUM TC 99M TETROFOSMIN IV KIT
30.0000 | PACK | Freq: Once | INTRAVENOUS | Status: AC | PRN
  Administered 2016-08-29: 30.2 via INTRAVENOUS

## 2016-08-29 MED ORDER — IOPAMIDOL (ISOVUE-370) INJECTION 76%
100.0000 mL | Freq: Once | INTRAVENOUS | Status: AC | PRN
  Administered 2016-08-29: 100 mL via INTRAVENOUS

## 2016-08-29 MED ORDER — TECHNETIUM TC 99M TETROFOSMIN IV KIT
10.0000 | PACK | Freq: Once | INTRAVENOUS | Status: AC | PRN
  Administered 2016-08-29: 11 via INTRAVENOUS

## 2016-08-29 NOTE — Telephone Encounter (Signed)
-----   Message from Josue Hector, MD sent at 08/29/2016  2:12 PM EST ----- No PE suggestion of possible early interstitial lung disease suggest pulmonary f/u

## 2016-11-26 IMAGING — NM NM MYOCAR MULTI W/SPECT W/WALL MOTION & EF
2 series · 12 of 12 positions shown · non-contrast
Comparison: none

[Series 1: rest · 8.28mm/px · 6 of 64 frames shown]
[frame 6/64]
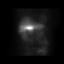
[frame 16/64]
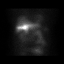
[frame 27/64]
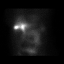
[frame 38/64]
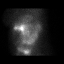
[frame 48/64]
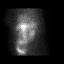
[frame 59/64]
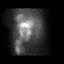

[Series 2: stress gated · 8.28mm/px · 6 of 64 frames shown]
[frame 6/64]
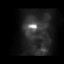
[frame 16/64]
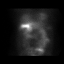
[frame 27/64]
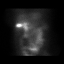
[frame 38/64]
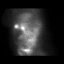
[frame 48/64]
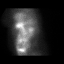
[frame 59/64]
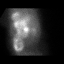

[12 of 12 positions shown; findings below may reference images not displayed]

Canned report from images found in remote index.

Refer to host system for actual result text.

## 2016-12-10 ENCOUNTER — Other Ambulatory Visit: Payer: Self-pay | Admitting: Adult Health

## 2017-02-06 ENCOUNTER — Other Ambulatory Visit (HOSPITAL_COMMUNITY): Payer: Self-pay | Admitting: Pulmonary Disease

## 2017-02-06 DIAGNOSIS — R0602 Shortness of breath: Secondary | ICD-10-CM

## 2017-02-17 ENCOUNTER — Ambulatory Visit (HOSPITAL_COMMUNITY)
Admission: RE | Admit: 2017-02-17 | Discharge: 2017-02-17 | Disposition: A | Discharge status: Home / Self Care | Payer: 59 | Source: Ambulatory Visit | Attending: Pulmonary Disease | Admitting: Pulmonary Disease

## 2017-02-17 DIAGNOSIS — I700 Atherosclerosis of aorta: Secondary | ICD-10-CM | POA: Diagnosis not present

## 2017-02-17 DIAGNOSIS — R0602 Shortness of breath: Secondary | ICD-10-CM | POA: Diagnosis present

## 2017-02-17 DIAGNOSIS — R918 Other nonspecific abnormal finding of lung field: Secondary | ICD-10-CM | POA: Diagnosis not present

## 2017-02-17 DIAGNOSIS — I2510 Atherosclerotic heart disease of native coronary artery without angina pectoris: Secondary | ICD-10-CM | POA: Diagnosis not present

## 2017-02-27 ENCOUNTER — Other Ambulatory Visit (HOSPITAL_COMMUNITY): Payer: Self-pay | Admitting: Specialist

## 2017-02-27 DIAGNOSIS — R1319 Other dysphagia: Secondary | ICD-10-CM

## 2017-03-11 ENCOUNTER — Ambulatory Visit (HOSPITAL_COMMUNITY)
Admission: RE | Admit: 2017-03-11 | Discharge: 2017-03-11 | Disposition: A | Discharge status: Home / Self Care | Payer: 59 | Source: Ambulatory Visit | Attending: Pulmonary Disease | Admitting: Pulmonary Disease

## 2017-03-11 ENCOUNTER — Ambulatory Visit (HOSPITAL_COMMUNITY): Payer: 59 | Attending: Pulmonary Disease | Admitting: Speech Pathology

## 2017-03-11 ENCOUNTER — Other Ambulatory Visit (HOSPITAL_COMMUNITY): Payer: Self-pay | Admitting: Pulmonary Disease

## 2017-03-11 DIAGNOSIS — I110 Hypertensive heart disease with heart failure: Secondary | ICD-10-CM | POA: Diagnosis not present

## 2017-03-11 DIAGNOSIS — E785 Hyperlipidemia, unspecified: Secondary | ICD-10-CM | POA: Insufficient documentation

## 2017-03-11 DIAGNOSIS — R1312 Dysphagia, oropharyngeal phase: Secondary | ICD-10-CM | POA: Insufficient documentation

## 2017-03-11 DIAGNOSIS — I509 Heart failure, unspecified: Secondary | ICD-10-CM | POA: Diagnosis not present

## 2017-03-11 DIAGNOSIS — R1310 Dysphagia, unspecified: Secondary | ICD-10-CM | POA: Diagnosis present

## 2017-03-11 DIAGNOSIS — R1319 Other dysphagia: Secondary | ICD-10-CM

## 2017-03-13 ENCOUNTER — Encounter (HOSPITAL_COMMUNITY): Payer: Self-pay | Admitting: Speech Pathology

## 2017-03-13 NOTE — Therapy (Signed)
[facility] [place], [place], [address] Phone: [phone]   Fax:  [phone]  Modified Barium Swallow  Patient Details  Name: Joy Leach MRN: [id number] Date of Birth: 1955/11/28 No Data Recorded  Encounter Date: 03/11/2017      End of Session - 03/11/17 1921    Visit Number 1   Number of Visits 1   Authorization Type U[employer]   SLP Start Time 1300   SLP Stop Time  1341   SLP Time Calculation (min) 41 min   Activity Tolerance Patient tolerated treatment well      Past Medical History:  Diagnosis Date  . CHF (congestive heart failure) ([place])   . Hyperlipidemia   . Hypertension     Past Surgical History:  Procedure Laterality Date  . hystorectomy    . SINUS SURGERY WITH INSTATRAK      There were no vitals filed for this visit.      Subjective Assessment - 03/13/17 1911    Subjective "I get very short of breath."   Patient is accompained by: Family member  Sister   Special Tests MBSS   Currently in Pain? No/denies           Oral Preparation/Oral Phase - 03/11/17 1916      Oral Preparation/Oral Phase   Oral Phase Within functional limits     Electrical stimulation - Oral Phase   Was Electrical Stimulation Used No          Pharyngeal Phase - 03/11/17 1916      Pharyngeal Phase   Pharyngeal Phase Impaired     Pharyngeal - Thin   Pharyngeal- Thin Cup Swallow initiation at vallecula;Within functional limits   Pharyngeal- Thin Straw Trace aspiration;Penetration/Apiration after swallow  Pt took 6 sequential straw sips and aspirated trace amount on 5th swallow   Pharyngeal Material enters airway, passes BELOW cords then ejected out     Pharyngeal - Solids   Pharyngeal- Puree Within functional limits   Pharyngeal- Multi-consistency Penetration/Aspiration during swallow   Pharyngeal Material enters airway, remains ABOVE vocal cords then ejected out   Pharyngeal- Pill Within functional limits     Electrical Stimulation - Pharyngeal Phase   Was Electrical Stimulation Used No          Cricopharyngeal Phase - 03/11/17 1919      Cervical Esophageal Phase   Cervical Esophageal Phase Within functional limits  Pt appeared to have hiatal hernia          Plan - 03/11/17 1922    Clinical Impression Statement Pt presents with essentially normal oropharyngeal swallow, however pt did have one episode of trace aspiration of thins when taking sequential straw sips of thing likely due to inhalation between sequential sips. Aspiration occurred on the 5th sequential swallow and spontanous throat clear elicited and removed with follow up cough. Pt took the barium tablet without liquid and then followed it with liquid wash. The tablet was transiently delayed in cervical esophagus, but did transition to stomach. Pt appeared to have a hiatal hernia, however no radiologist present to confirm. Recommend regular textures and thin liquids and take only single sips of liquids (avoid straws) and take pills with liquids. Also recommend reflux precautions and encouraged Pt to elevate her HOB given increased sputum expulsion in the AM. If Pt should continue to have problems, she may want to pursue GI consult. Pt and sister in agreement with plan of care.    Treatment/Interventions Aspiration precaution  training   Potential to Achieve Goals Good   Consulted and Agree with Plan of Care Patient      Patient will benefit from skilled therapeutic intervention in order to improve the following deficits and impairments:   Dysphagia, oropharyngeal phase        Recommendations/Treatment - 03/13/17 1919      Swallow Evaluation Recommendations   Recommended Consults Consider GI evaluation  recommend strict reflux precautions first   SLP Diet Recommendations Age appropriate regular;Thin   Liquid Administration via Cup;No straw   Medication Administration Whole meds with liquid   Supervision Patient able to  self feed   Postural Changes Seated upright at 90 degrees;Remain upright for at least 30 minutes after feeds/meals          Prognosis - 03/11/17 1921      Prognosis   Prognosis for Safe Diet Advancement Good     Individuals Consulted   Consulted and Agree with Results and Recommendations Patient;Family member/caregiver   Family Member Consulted sister   Report Sent to  Referring physician      Problem List Patient Active Problem List   Diagnosis Date Noted  . Hyperlipidemia 06/12/2016  . Anorexia 06/12/2016  . Edema 06/12/2016  . Otalgia 06/12/2016  . Wheezing 06/12/2016  . SOB (shortness of breath) 06/12/2016   Thank you,  Genene Churn, [place]  [facility] 03/11/2017, 7:28 PM  [facility] [address] [place], [place], [address] Phone: [phone]   Fax:  [phone]  Name: Joy Leach MRN: [id number] Date of Birth: August 01, 1956

## 2017-04-23 ENCOUNTER — Other Ambulatory Visit: Payer: Self-pay | Admitting: Adult Health

## 2017-05-14 ENCOUNTER — Ambulatory Visit (HOSPITAL_COMMUNITY): Payer: 59 | Attending: Pulmonary Disease

## 2017-05-14 DIAGNOSIS — R0602 Shortness of breath: Secondary | ICD-10-CM | POA: Diagnosis present

## 2017-05-20 DIAGNOSIS — R0600 Dyspnea, unspecified: Secondary | ICD-10-CM | POA: Diagnosis not present

## 2017-06-19 ENCOUNTER — Other Ambulatory Visit: Payer: Self-pay | Admitting: Adult Health

## 2018-02-02 ENCOUNTER — Ambulatory Visit (INDEPENDENT_AMBULATORY_CARE_PROVIDER_SITE_OTHER): Payer: 59 | Admitting: Otolaryngology

## 2018-02-02 DIAGNOSIS — R51 Headache: Secondary | ICD-10-CM | POA: Diagnosis not present

## 2018-02-02 DIAGNOSIS — J310 Chronic rhinitis: Secondary | ICD-10-CM | POA: Diagnosis not present

## 2018-02-11 ENCOUNTER — Other Ambulatory Visit (INDEPENDENT_AMBULATORY_CARE_PROVIDER_SITE_OTHER): Payer: Self-pay | Admitting: Otolaryngology

## 2018-02-11 DIAGNOSIS — J320 Chronic maxillary sinusitis: Secondary | ICD-10-CM

## 2018-03-03 ENCOUNTER — Ambulatory Visit (HOSPITAL_COMMUNITY)
Admission: RE | Admit: 2018-03-03 | Discharge: 2018-03-03 | Disposition: A | Discharge status: Home / Self Care | Payer: 59 | Source: Ambulatory Visit | Attending: Otolaryngology | Admitting: Otolaryngology

## 2018-03-03 DIAGNOSIS — J320 Chronic maxillary sinusitis: Secondary | ICD-10-CM | POA: Diagnosis not present

## 2018-03-11 ENCOUNTER — Ambulatory Visit (INDEPENDENT_AMBULATORY_CARE_PROVIDER_SITE_OTHER): Payer: 59 | Admitting: Diagnostic Neuroimaging

## 2018-03-11 ENCOUNTER — Encounter: Payer: Self-pay | Admitting: *Deleted

## 2018-03-11 VITALS — BP 157/95 | HR 83 | Ht 65.0 in | Wt 246.6 lb

## 2018-03-11 DIAGNOSIS — G43109 Migraine with aura, not intractable, without status migrainosus: Secondary | ICD-10-CM

## 2018-03-11 DIAGNOSIS — G4489 Other headache syndrome: Secondary | ICD-10-CM | POA: Diagnosis not present

## 2018-03-11 MED ORDER — RIZATRIPTAN BENZOATE 10 MG PO TBDP: 10.0000 mg | ORAL_TABLET | ORAL | 11 refills | Status: DC | PRN

## 2018-03-11 MED ORDER — ONDANSETRON HCL 4 MG PO TABS: 4.0000 mg | ORAL_TABLET | Freq: Three times a day (TID) | ORAL | 3 refills | Status: DC | PRN

## 2018-03-11 MED ORDER — TOPIRAMATE 25 MG PO TABS: 25.0000 mg | ORAL_TABLET | Freq: Two times a day (BID) | ORAL | 6 refills | Status: DC

## 2018-03-11 NOTE — Progress Notes (Signed)
[facility]  PATIENT: Joy Leach DOB: May 08, 1956  REFERRING CLINICIAN: [name], MD HISTORY FROM: patient  REASON FOR VISIT: new consult    HISTORICAL  CHIEF COMPLAINT:  Chief Complaint  Patient presents with  . NP Dr. Benjamine Mola    Rm 6, sister, Joy Leach  . Headache    Has hx of headaches, this year has worsened since January 2019.  Tried tylenol, motrin, excedrin migraine.  Has photo/phono sensitivity, nausea.     HISTORY OF PRESENT ILLNESS:   62 year old female here for evaluation of headaches.  History of lupus, hyperglycemia, hypertension, sicca syndrome, congestive heart failure.  Patient has history of headaches since age 32 years old.  She describes frontal throbbing severe headaches with nausea, photophobia, phonophobia and visual aura.  Over the last several months patient has had increase in frequency of headaches.  Previously patient was having one headache per month, and now patient having 20-25 headaches per month.  Headaches are more severe.  No specific triggering factors.   REVIEW OF SYSTEMS: Full 14 system review of systems performed and negative with exception of: Confusion headache snoring weakness dizziness not enough sleep allergies runny nose skin sensitivity urination problems incontinence joint pain cramps aching muscles feeling hot feeling cold increased thirst blurred vision eye pain chills weight gain fatigue swelling in legs ringing in ears spinning sensation.  ALLERGIES: Allergies  Allergen Reactions  . Sulfa Antibiotics Hives  . Codeine Nausea And Vomiting    HOME MEDICATIONS: Outpatient Medications Prior to Visit  Medication Sig Dispense Refill  . atorvastatin (LIPITOR) 10 MG tablet Take 10 mg by mouth daily.    Marland Kitchen BEVESPI AEROSPHERE 9-4.8 MCG/ACT AERO 2 puffs two times daily    . calcium-vitamin D (OSCAL WITH D) 500-200 MG-UNIT tablet Take 1 tablet by mouth.    . Cholecalciferol (VITAMIN D3) 3000 units TABS Take 50,000 Units  by mouth daily.    . diclofenac sodium (VOLTAREN) 1 % GEL 3 (three) times daily.     . furosemide (LASIX) 20 MG tablet TAKE (1) TABLET BY MOUTH ONCE DAILY AS NEEDED. 30 tablet 0  . HYDRALAZINE HCL PO Take by mouth. As needed    . hydroxychloroquine (PLAQUENIL) 200 MG tablet Take 400 mg by mouth daily.    Marland Kitchen loperamide (IMODIUM A-D) 2 MG tablet Take 2 mg by mouth 2 (two) times daily.    Marland Kitchen losartan (COZAAR) 50 MG tablet Take 1 tablet (50 mg total) by mouth daily. 90 tablet 3  . Multiple Vitamin (MULTIVITAMIN) tablet Take 1 tablet by mouth daily.    . Omega-3 Fatty Acids (FISH OIL) 1000 MG CAPS Take 1,000 mg by mouth 2 (two) times daily.    Marland Kitchen omeprazole (PRILOSEC) 20 MG capsule Take 20 mg by mouth daily.    . pilocarpine (SALAGEN) 5 MG tablet Take 5 mg by mouth 3 (three) times daily.    . predniSONE (DELTASONE) 10 MG tablet Take 10 mg by mouth daily with breakfast.     No facility-administered medications prior to visit.     PAST MEDICAL HISTORY: Past Medical History:  Diagnosis Date  . CHF (congestive heart failure) ([place])   . CHF (congestive heart failure) ([place])   . Hyperlipidemia   . Hypertension   . Lupus ([place])   . Sicca ([place])     PAST SURGICAL HISTORY: Past Surgical History:  Procedure Laterality Date  . hystorectomy    . SINUS SURGERY WITH INSTATRAK  2015    FAMILY HISTORY: Family History  Problem Relation Age of Onset  . Hypertension Mother   . Diabetes Mother   . Kidney disease Mother   . Heart disease Mother   . High Cholesterol Mother   . Lung cancer Mother   . Hypertension Father   . Diabetes Father   . Kidney disease Father   . Heart disease Father     SOCIAL HISTORY:  Social History   Socioeconomic History  . Marital status: Divorced    Spouse name: Not on file  . Number of children: Not on file  . Years of education: Not on file  . Highest education level: Not on file  Occupational History  . Not on file  Social Needs  . Financial resource  strain: Not on file  . Food insecurity:    Worry: Not on file    Inability: Not on file  . Transportation needs:    Medical: Not on file    Non-medical: Not on file  Tobacco Use  . Smoking status: Never Smoker  . Smokeless tobacco: Never Used  Substance and Sexual Activity  . Alcohol use: No  . Drug use: No  . Sexual activity: Yes    Partners: Male  Lifestyle  . Physical activity:    Days per week: Not on file    Minutes per session: Not on file  . Stress: Not on file  Relationships  . Social connections:    Talks on phone: Not on file    Gets together: Not on file    Attends religious service: Not on file    Active member of club or organization: Not on file    Attends meetings of clubs or organizations: Not on file    Relationship status: Not on file  . Intimate partner violence:    Fear of current or ex partner: Not on file    Emotionally abused: Not on file    Physically abused: Not on file    Forced sexual activity: Not on file  Other Topics Concern  . Not on file  Social History Narrative   Lives home alone.  Widow.    Is Disabled.  One child.  Sister, Joy Leach.     PHYSICAL EXAM  GENERAL EXAM/CONSTITUTIONAL: Vitals:  Vitals:   03/11/18 0902  BP: (!) 157/95  Pulse: 83  Weight: 246 lb 9.6 oz (111.9 kg)  Height: 5\' 5"  (1.651 m)     Body mass index is 41.04 kg/m.  Visual Acuity Screening   Right eye Left eye Both eyes  Without correction: 20/40 20/40   With correction:        Patient is in no distress; well developed, nourished and groomed; neck is supple  CARDIOVASCULAR:  Examination of carotid arteries is normal; no carotid bruits  Regular rate and rhythm, no murmurs  Examination of peripheral vascular system by observation and palpation is normal  EYES:  Ophthalmoscopic exam of optic discs and posterior segments is normal; no papilledema or hemorrhages  MUSCULOSKELETAL:  Gait, strength, tone, movements noted in Neurologic exam  below  NEUROLOGIC: MENTAL STATUS:  No flowsheet data found.  awake, alert, oriented to person, place and time  recent and remote memory intact  normal attention and concentration  language fluent, comprehension intact, naming intact,   fund of knowledge appropriate  CRANIAL NERVE:   2nd - no papilledema on fundoscopic exam  2nd, 3rd, 4th, 6th - pupils equal and reactive to light, visual fields full to confrontation, extraocular muscles intact, no nystagmus  5th -  facial sensation symmetric  7th - facial strength symmetric  8th - hearing intact  9th - palate elevates symmetrically, uvula midline  11th - shoulder shrug symmetric  12th - tongue protrusion midline  MOTOR:   normal bulk and tone, full strength in the BUE, BLE  SENSORY:   normal and symmetric to light touch, temperature, vibration  COORDINATION:   finger-nose-finger, fine finger movements normal  REFLEXES:   deep tendon reflexes TRACE and symmetric  GAIT/STATION:   narrow based gait; CAUTIOUS GAIT    DIAGNOSTIC DATA (LABS, IMAGING, TESTING) - I reviewed patient records, labs, notes, testing and imaging myself where available.  No results found for: WBC, HGB, HCT, MCV, PLT    Component Value Date/Time   NA 140 08/20/2016 1422   K 3.8 08/20/2016 1422   CL 99 08/20/2016 1422   CO2 29 08/20/2016 1422   GLUCOSE 179 (H) 08/20/2016 1422   BUN 15 08/20/2016 1422   CREATININE 1.06 (H) 08/20/2016 1422   CALCIUM 9.8 08/20/2016 1422   No results found for: CHOL, HDL, LDLCALC, LDLDIRECT, TRIG, CHOLHDL No results found for: HGBA1C No results found for: VITAMINB12 Lab Results  Component Value Date   TSH 1.13 07/12/2016    03/03/18 CT sinus [I reviewed images myself and agree with interpretation. -VRP]  - Normally aerated paranasal sinuses.  Patent sinus drainage pathways.    ASSESSMENT AND PLAN  62 y.o. year old female here with history of migraine headaches with aura since age 58  years old, now with increasing severity and change in headaches in the past several months (since January 2019).    Dx:  1. Other headache syndrome      PLAN:  TESTING - check labs today - check MRI brain w/wo  HEADACHE PREVENTION - start low dose topiramate 25mg  at bedtime (? Stage III CKD)  HEADACHE RESCUE - trial of rizatriptan as needed for migraine  - ondansetron 4-8mg  as needed for nausea   Orders Placed This Encounter  Procedures  . MR BRAIN W WO CONTRAST  . CBC with Differential/Platelet  . Comprehensive metabolic panel   Meds ordered this encounter  Medications  . topiramate (TOPAMAX) 25 MG tablet    Sig: Take 1 tablet (25 mg total) by mouth 2 (two) times daily.    Dispense:  60 tablet    Refill:  6  . rizatriptan (MAXALT-MLT) 10 MG disintegrating tablet    Sig: Take 1 tablet (10 mg total) by mouth as needed for migraine. May repeat in 2 hours if needed    Dispense:  9 tablet    Refill:  11  . ondansetron (ZOFRAN) 4 MG tablet    Sig: Take 1-2 tablets (4-8 mg total) by mouth every 8 (eight) hours as needed for nausea or vomiting.    Dispense:  30 tablet    Refill:  3   Return in about 5 months (around 08/11/2018).  I reviewed images, labs, notes, records myself. I summarized findings and reviewed with patient, for this high risk condition (new onset headaches; lupus) requiring high complexity decision making.     Penni Bombard, MD 0/34/7425, 95:63 AM Certified in Neurology, Neurophysiology and Neuroimaging  [facility] [address] [place], [place] [address] [phone]

## 2018-03-11 NOTE — Patient Instructions (Signed)
  TESTING - check labs today - check MRI brain w/wo  HEADACHE PREVENTION - start low dose topiramate 25mg  at bedtime   HEADACHE RESCUE - trial of rizatriptan as needed for migraine  - ondansetron 4-8mg  as needed for nausea

## 2018-03-12 ENCOUNTER — Ambulatory Visit (INDEPENDENT_AMBULATORY_CARE_PROVIDER_SITE_OTHER): Payer: 59 | Admitting: Otolaryngology

## 2018-03-12 ENCOUNTER — Telehealth: Payer: Self-pay | Admitting: Diagnostic Neuroimaging

## 2018-03-12 DIAGNOSIS — J343 Hypertrophy of nasal turbinates: Secondary | ICD-10-CM

## 2018-03-12 DIAGNOSIS — J310 Chronic rhinitis: Secondary | ICD-10-CM | POA: Diagnosis not present

## 2018-03-12 LAB — COMPREHENSIVE METABOLIC PANEL
ALK PHOS: 78 IU/L (ref 39–117)
ALT: 25 IU/L (ref 0–32)
AST: 23 IU/L (ref 0–40)
Albumin/Globulin Ratio: 2 (ref 1.2–2.2)
Albumin: 4.5 g/dL (ref 3.6–4.8)
BUN/Creatinine Ratio: 18 (ref 12–28)
BUN: 18 mg/dL (ref 8–27)
Bilirubin Total: 1.3 mg/dL — ABNORMAL HIGH (ref 0.0–1.2)
CALCIUM: 9.9 mg/dL (ref 8.7–10.3)
CO2: 23 mmol/L (ref 20–29)
CREATININE: 0.98 mg/dL (ref 0.57–1.00)
Chloride: 104 mmol/L (ref 96–106)
GFR calc Af Amer: 73 mL/min/{1.73_m2} (ref >59)
GFR calc non Af Amer: 63 mL/min/{1.73_m2} (ref >59)
GLUCOSE: 168 mg/dL — AB (ref 65–99)
Globulin, Total: 2.3 g/dL (ref 1.5–4.5)
Potassium: 4.7 mmol/L (ref 3.5–5.2)
Sodium: 143 mmol/L (ref 134–144)
Total Protein: 6.8 g/dL (ref 6.0–8.5)

## 2018-03-12 LAB — CBC WITH DIFFERENTIAL/PLATELET
BASOS ABS: 0 10*3/uL (ref 0.0–0.2)
Basos: 0 %
EOS (ABSOLUTE): 0.1 10*3/uL (ref 0.0–0.4)
Eos: 1 %
Hematocrit: 40.4 % (ref 34.0–46.6)
Hemoglobin: 13.4 g/dL (ref 11.1–15.9)
IMMATURE GRANULOCYTES: 0 %
Immature Grans (Abs): 0 10*3/uL (ref 0.0–0.1)
LYMPHS ABS: 1.2 10*3/uL (ref 0.7–3.1)
Lymphs: 9 %
MCH: 29.4 pg (ref 26.6–33.0)
MCHC: 33.2 g/dL (ref 31.5–35.7)
MCV: 89 fL (ref 79–97)
MONOCYTES: 7 %
MONOS ABS: 0.9 10*3/uL (ref 0.1–0.9)
NEUTROS PCT: 83 %
Neutrophils Absolute: 10.7 10*3/uL — ABNORMAL HIGH (ref 1.4–7.0)
Platelets: 217 10*3/uL (ref 150–450)
RBC: 4.56 x10E6/uL (ref 3.77–5.28)
RDW: 14 % (ref 12.3–15.4)
WBC: 12.9 10*3/uL — AB (ref 3.4–10.8)

## 2018-03-12 NOTE — Telephone Encounter (Signed)
[employer] Auth: [place] via [employer]. Patient is scheduled at [facility] for Wed. 03/18/18.Marland Kitchen Patient is aware.

## 2018-03-18 ENCOUNTER — Ambulatory Visit (HOSPITAL_COMMUNITY)
Admission: RE | Admit: 2018-03-18 | Discharge: 2018-03-18 | Disposition: A | Discharge status: Home / Self Care | Payer: 59 | Source: Ambulatory Visit | Attending: Diagnostic Neuroimaging | Admitting: Diagnostic Neuroimaging

## 2018-03-18 ENCOUNTER — Ambulatory Visit (HOSPITAL_COMMUNITY): Payer: 59

## 2018-03-18 DIAGNOSIS — G4489 Other headache syndrome: Secondary | ICD-10-CM | POA: Insufficient documentation

## 2018-03-18 MED ORDER — GADOBENATE DIMEGLUMINE 529 MG/ML IV SOLN
20.0000 mL | Freq: Once | INTRAVENOUS | Status: AC | PRN
  Administered 2018-03-18: 20 mL via INTRAVENOUS

## 2018-03-19 ENCOUNTER — Telehealth: Payer: Self-pay | Admitting: *Deleted

## 2018-03-19 NOTE — Telephone Encounter (Signed)
Spoke with patient and informed her that her MRI brain result was unremarkable, no cause for her headaches noted. Advised her Dr Leta Baptist recommends she continue with his plan and reviewed plan per last office note. Confirmed she is taking topiramate. Advised she call before her FU for any questions, concerns, worsening symptoms or headaches not well controlled.  She verbalized understanding, appreciation.

## 2018-05-31 IMAGING — CT CT MAXILLOFACIAL W/O CM
3 series · 13 of 47 positions shown, 15 images · non-contrast
Comparison: None.

CLINICAL DATA: Frontal and maxillary sinus pain and pressure since
October 2017. Cough.

EXAM:
CT MAXILLOFACIAL WITHOUT CONTRAST
TECHNIQUE: Multidetector CT images of the paranasal sinuses were obtained using
the standard protocol without intravenous contrast.

[Series 2: standard · axial · 0.37mm/px · z∈[-27,+73]mm · 7 of 122 slices shown, 9 images]
[im 13/122  brain]
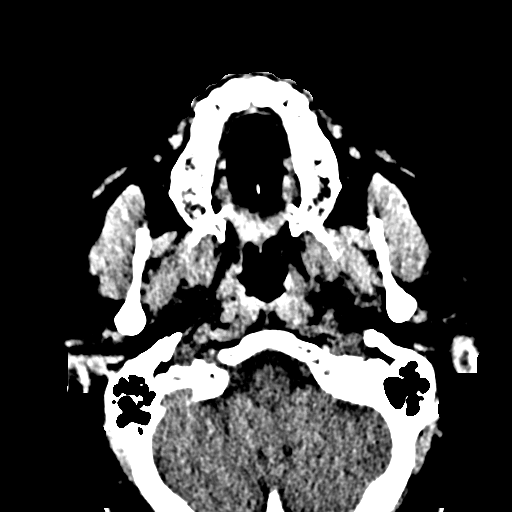
[im 13/122  bone]
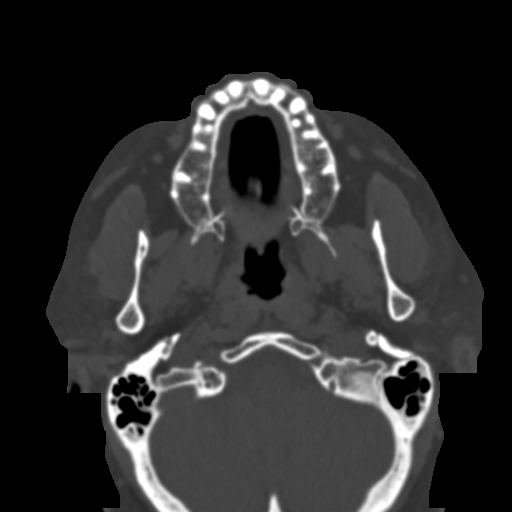
[im 30/122  bone]
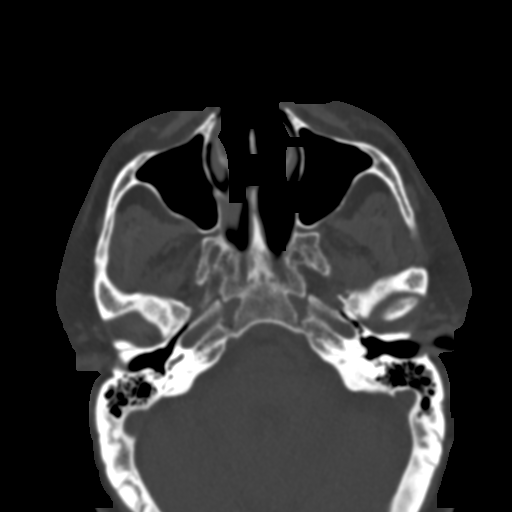
[im 46/122  bone]
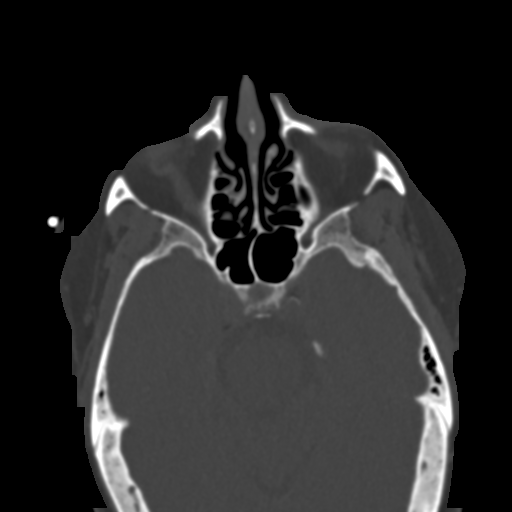
[im 63/122  bone]
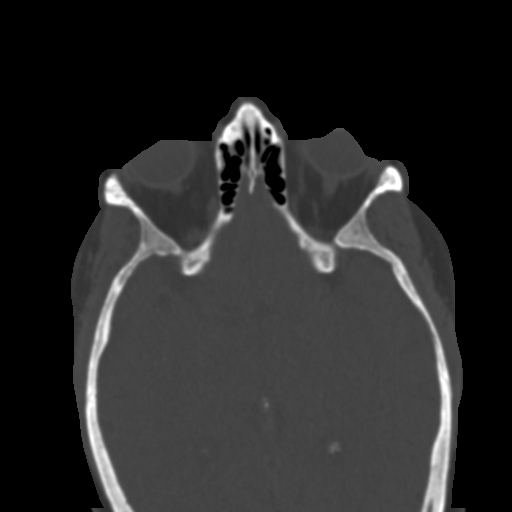
[im 80/122  brain]
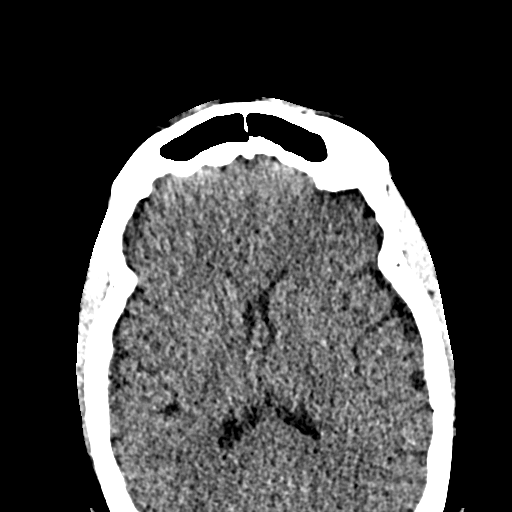
[im 80/122  bone]
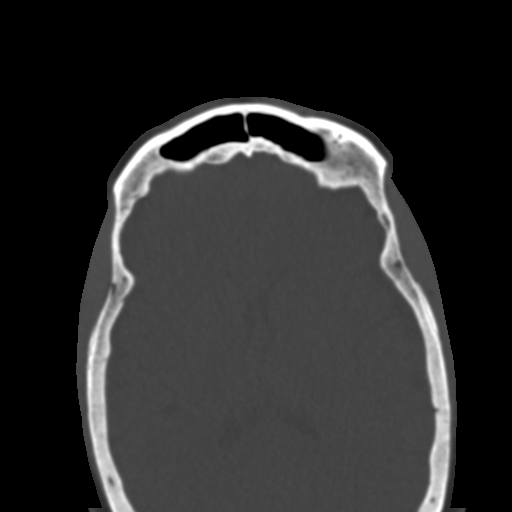
[im 96/122  bone]
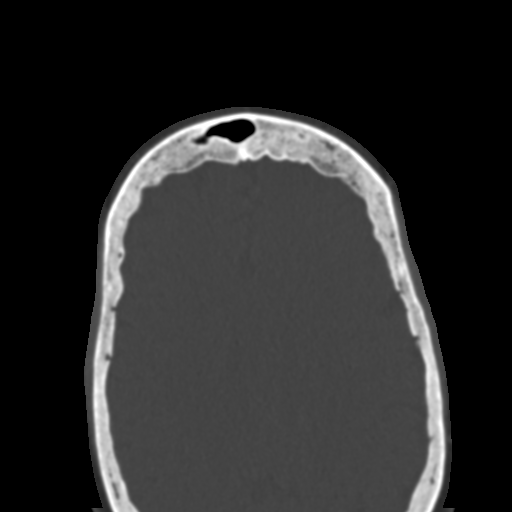
[im 113/122  bone]
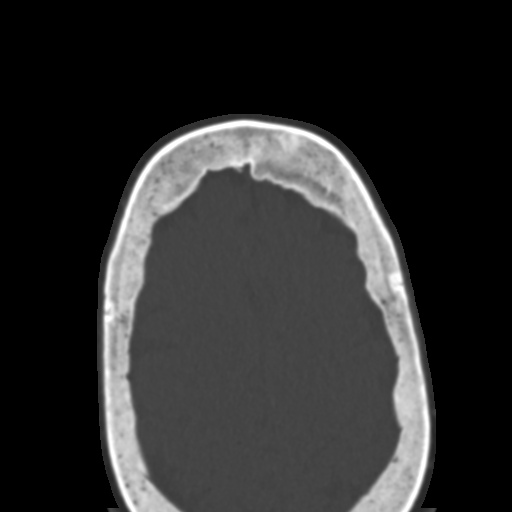

[Series 4: coronal · coronal · 0.25mm/px · 3 of 107 slices shown]
[im 36/107  bone]
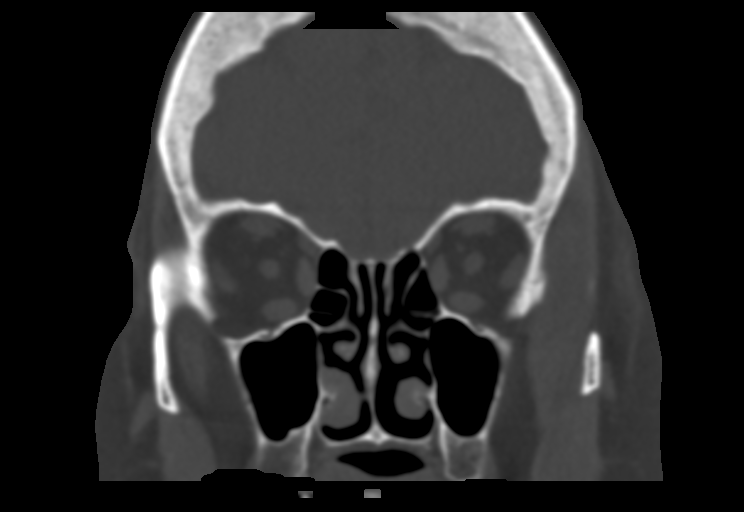
[im 48/107  bone]
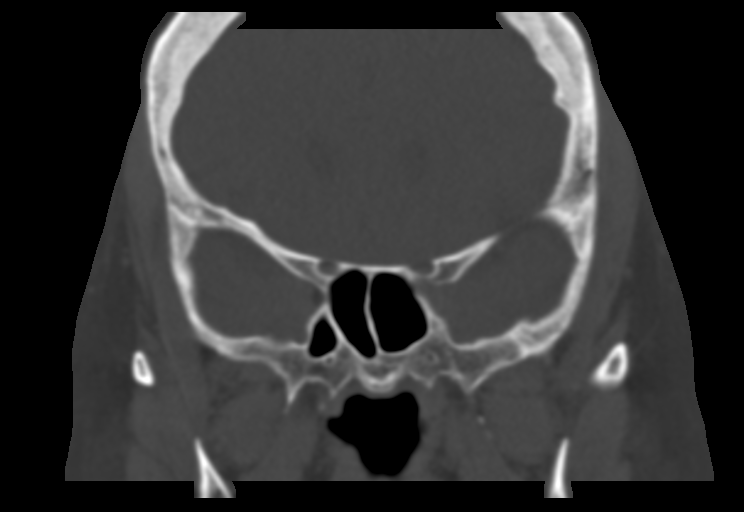
[im 59/107  bone]
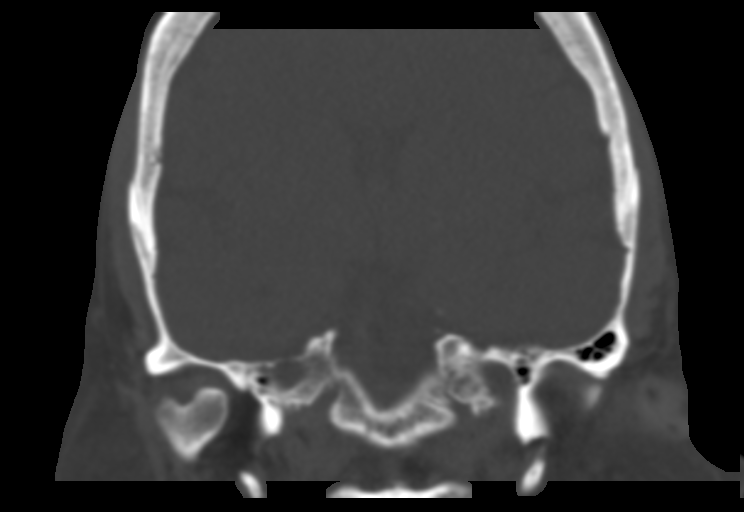

[Series 5: sagittal · sagittal · 0.24mm/px · 3 of 101 slices shown]
[im 34/101  bone]
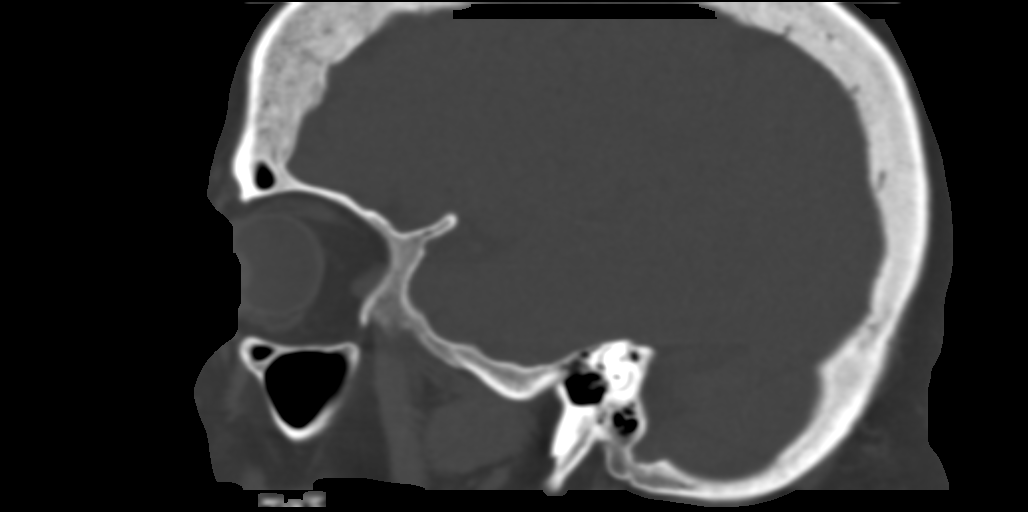
[im 51/101  bone]
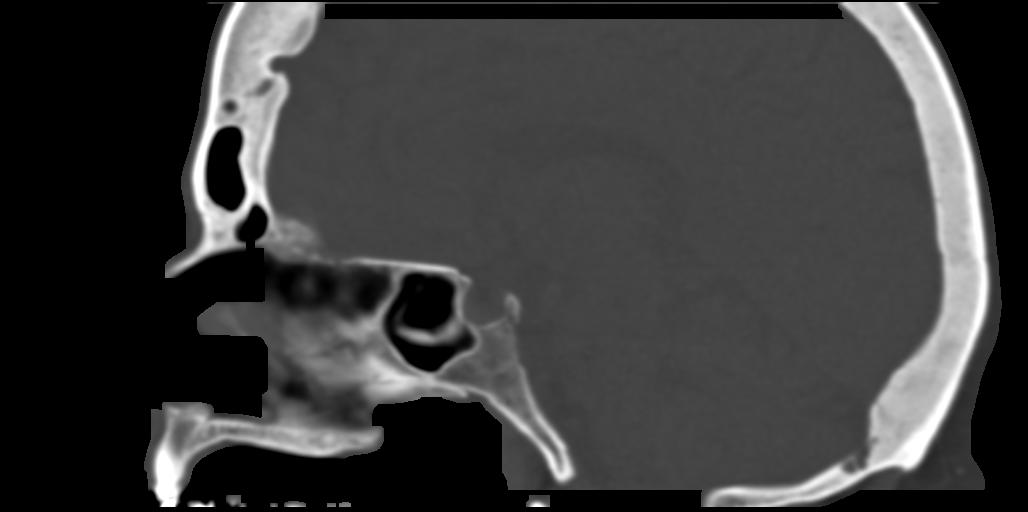
[im 67/101  bone]
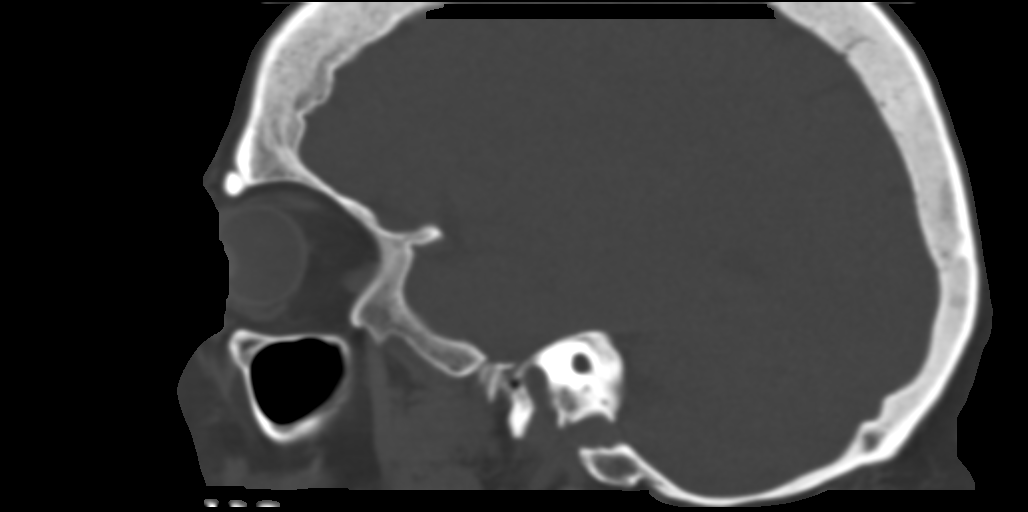

[13 of 47 positions shown; findings below may reference images not displayed]

FINDINGS: Paranasal sinuses:

Frontal: Normally aerated. Patent frontal sinus drainage pathways.

Ethmoid: Normally aerated.

Maxillary: Normally aerated.

Sphenoid: Normally aerated. Patent sphenoethmoidal recesses.

Right ostiomeatal unit: Patent.

Left ostiomeatal unit: Patent.

Nasal passages: Patent. Intact nasal septum is midline.

Anatomy: No pneumatization superior to anterior ethmoid notches.
Presellar sphenoid pneumatization pattern. No dehiscence of carotid
or optic canals. No onodi cell.

Other: Orbits and intracranial compartment are unremarkable. Visible
mastoid air cells are normally aerated.
IMPRESSION: Normally aerated paranasal sinuses.  Patent sinus drainage pathways.

## 2018-06-03 ENCOUNTER — Other Ambulatory Visit (HOSPITAL_COMMUNITY): Payer: Self-pay | Admitting: Respiratory Therapy

## 2018-06-03 DIAGNOSIS — R0602 Shortness of breath: Secondary | ICD-10-CM

## 2018-06-11 ENCOUNTER — Ambulatory Visit (HOSPITAL_COMMUNITY)
Admission: RE | Admit: 2018-06-11 | Discharge: 2018-06-11 | Disposition: A | Discharge status: Home / Self Care | Payer: Medicare HMO | Source: Ambulatory Visit | Attending: Pulmonary Disease | Admitting: Pulmonary Disease

## 2018-06-11 DIAGNOSIS — R0602 Shortness of breath: Secondary | ICD-10-CM | POA: Diagnosis present

## 2018-06-11 LAB — PULMONARY FUNCTION TEST
DL/VA % pred: 82 %
DL/VA: 4.03 ml/min/mmHg/L
DLCO UNC % PRED: 66 %
DLCO UNC: 16.97 ml/min/mmHg
FEF 25-75 PRE: 2.96 L/s
FEF 25-75 Post: 4.85 L/sec
FEF2575-%Change-Post: 63 %
FEF2575-%Pred-Post: 205 %
FEF2575-%Pred-Pre: 125 %
FEV1-%Change-Post: 13 %
FEV1-%PRED-POST: 92 %
FEV1-%Pred-Pre: 81 %
FEV1-POST: 2.43 L
FEV1-Pre: 2.14 L
FEV1FVC-%Change-Post: 0 %
FEV1FVC-%PRED-PRE: 113 %
FEV6-%Change-Post: 14 %
FEV6-%PRED-POST: 84 %
FEV6-%Pred-Pre: 73 %
FEV6-POST: 2.76 L
FEV6-Pre: 2.41 L
FEV6FVC-%PRED-POST: 104 %
FEV6FVC-%Pred-Pre: 104 %
FVC-%Change-Post: 14 %
FVC-%Pred-Post: 81 %
FVC-%Pred-Pre: 70 %
FVC-Post: 2.76 L
FVC-Pre: 2.41 L
PRE FEV1/FVC RATIO: 89 %
Post FEV1/FVC ratio: 88 %
Post FEV6/FVC ratio: 100 %
Pre FEV6/FVC Ratio: 100 %
RV % PRED: 152 %
RV: 3.14 L
TLC % PRED: 119 %
TLC: 6.24 L

## 2018-06-11 MED ORDER — ALBUTEROL SULFATE (2.5 MG/3ML) 0.083% IN NEBU
2.5000 mg | INHALATION_SOLUTION | Freq: Once | RESPIRATORY_TRACT | Status: AC
  Administered 2018-06-11: 2.5 mg via RESPIRATORY_TRACT

## 2018-06-16 ENCOUNTER — Other Ambulatory Visit (HOSPITAL_COMMUNITY): Payer: Self-pay | Admitting: Pulmonary Disease

## 2018-06-16 DIAGNOSIS — R0602 Shortness of breath: Secondary | ICD-10-CM

## 2018-06-30 ENCOUNTER — Ambulatory Visit (HOSPITAL_COMMUNITY): Payer: Medicare HMO

## 2018-07-03 ENCOUNTER — Ambulatory Visit (HOSPITAL_COMMUNITY)
Admission: RE | Admit: 2018-07-03 | Discharge: 2018-07-03 | Disposition: A | Discharge status: Home / Self Care | Payer: Medicare HMO | Source: Ambulatory Visit | Attending: Pulmonary Disease | Admitting: Pulmonary Disease

## 2018-07-03 DIAGNOSIS — R918 Other nonspecific abnormal finding of lung field: Secondary | ICD-10-CM | POA: Insufficient documentation

## 2018-07-03 DIAGNOSIS — J984 Other disorders of lung: Secondary | ICD-10-CM | POA: Insufficient documentation

## 2018-07-03 DIAGNOSIS — R0602 Shortness of breath: Secondary | ICD-10-CM | POA: Diagnosis present

## 2018-07-03 DIAGNOSIS — I700 Atherosclerosis of aorta: Secondary | ICD-10-CM | POA: Diagnosis not present

## 2018-07-20 ENCOUNTER — Ambulatory Visit (INDEPENDENT_AMBULATORY_CARE_PROVIDER_SITE_OTHER): Payer: Medicare HMO | Admitting: Otolaryngology

## 2018-07-20 DIAGNOSIS — K219 Gastro-esophageal reflux disease without esophagitis: Secondary | ICD-10-CM

## 2018-07-20 DIAGNOSIS — H9209 Otalgia, unspecified ear: Secondary | ICD-10-CM | POA: Diagnosis not present

## 2018-07-20 DIAGNOSIS — R070 Pain in throat: Secondary | ICD-10-CM | POA: Diagnosis not present

## 2018-08-17 ENCOUNTER — Ambulatory Visit: Payer: Medicare HMO | Admitting: Diagnostic Neuroimaging

## 2018-08-17 ENCOUNTER — Encounter: Payer: Self-pay | Admitting: Diagnostic Neuroimaging

## 2018-08-17 VITALS — BP 144/84 | HR 78 | Ht 65.0 in | Wt 243.6 lb

## 2018-08-17 DIAGNOSIS — G4489 Other headache syndrome: Secondary | ICD-10-CM | POA: Diagnosis not present

## 2018-08-17 DIAGNOSIS — G43109 Migraine with aura, not intractable, without status migrainosus: Secondary | ICD-10-CM

## 2018-08-17 MED ORDER — TOPIRAMATE 25 MG PO TABS: 25.0000 mg | ORAL_TABLET | Freq: Every day | ORAL | 4 refills | Status: DC

## 2018-08-17 MED ORDER — RIZATRIPTAN BENZOATE 10 MG PO TBDP: 10.0000 mg | ORAL_TABLET | ORAL | 11 refills | Status: DC | PRN

## 2018-08-17 NOTE — Progress Notes (Signed)
[facility]  PATIENT: Joy Leach DOB: [birth year]  REFERRING CLINICIAN: [name], MD HISTORY FROM: patient  REASON FOR VISIT: follow up   HISTORICAL  CHIEF COMPLAINT:  Chief Complaint  Patient presents with  . Follow-up    Patient reports that the migraines have been better.     HISTORY OF PRESENT ILLNESS:   UPDATE (08/17/18, VRP): Since last visit, doing well. Symptoms are improved. Severity is mild. No alleviating or aggravating factors. Tolerating TPX. Only a few HA since last visit. Rain and lack of sleep trigger HA. Rizatriptan helps. Also asking about neck swelling in head / neck. On chronic prednisone.   PRIOR HPI (03/11/18): 62 year old female here for evaluation of headaches.  History of lupus, hyperglycemia, hypertension, sicca syndrome, congestive heart failure.  Patient has history of headaches since age 54 years old.  She describes frontal throbbing severe headaches with nausea, photophobia, phonophobia and visual aura.  Over the last several months patient has had increase in frequency of headaches.  Previously patient was having one headache per month, and now patient having 20-25 headaches per month.  Headaches are more severe.  No specific triggering factors.   REVIEW OF SYSTEMS: Full 14 system review of systems performed and negative with exception of: headache dizziness chills cough.    ALLERGIES: Allergies  Allergen Reactions  . Sulfa Antibiotics Hives  . Codeine Nausea And Vomiting    HOME MEDICATIONS: Outpatient Medications Prior to Visit  Medication Sig Dispense Refill  . atorvastatin (LIPITOR) 10 MG tablet Take 10 mg by mouth daily.    Marland Kitchen BEVESPI AEROSPHERE 9-4.8 MCG/ACT AERO 2 puffs two times daily    . calcium-vitamin D (OSCAL WITH D) 500-200 MG-UNIT tablet Take 1 tablet by mouth.    . Cholecalciferol (VITAMIN D3) 3000 units TABS Take 50,000 Units by mouth daily.    . diclofenac sodium (VOLTAREN) 1 % GEL 3 (three) times  daily.     . famotidine (PEPCID) 40 MG tablet Take 1 tablet by mouth daily.  3  . furosemide (LASIX) 20 MG tablet TAKE (1) TABLET BY MOUTH ONCE DAILY AS NEEDED. 30 tablet 0  . HYDRALAZINE HCL PO Take by mouth. As needed    . hydroxychloroquine (PLAQUENIL) 200 MG tablet Take 400 mg by mouth daily.    Marland Kitchen loperamide (IMODIUM A-D) 2 MG tablet Take 2 mg by mouth 2 (two) times daily.    Marland Kitchen loratadine (CLARITIN) 10 MG tablet Take 1 tablet by mouth daily.  2  . losartan (COZAAR) 50 MG tablet Take 1 tablet (50 mg total) by mouth daily. 90 tablet 3  . metFORMIN (GLUCOPHAGE) 500 MG tablet Take 1 tablet by mouth daily.  0  . montelukast (SINGULAIR) 10 MG tablet Take 1 tablet by mouth daily.  2  . Multiple Vitamin (MULTIVITAMIN) tablet Take 1 tablet by mouth daily.    . Omega-3 Fatty Acids (FISH OIL) 1000 MG CAPS Take 1,000 mg by mouth 2 (two) times daily.    Marland Kitchen omeprazole (PRILOSEC) 20 MG capsule Take 20 mg by mouth daily.    . ondansetron (ZOFRAN) 4 MG tablet Take 1-2 tablets (4-8 mg total) by mouth every 8 (eight) hours as needed for nausea or vomiting. 30 tablet 3  . pilocarpine (SALAGEN) 5 MG tablet Take 5 mg by mouth 3 (three) times daily.    . predniSONE (DELTASONE) 10 MG tablet Take 10 mg by mouth daily with breakfast.    . rizatriptan (MAXALT-MLT) 10 MG disintegrating tablet Take 1  tablet (10 mg total) by mouth as needed for migraine. May repeat in 2 hours if needed 9 tablet 11  . topiramate (TOPAMAX) 25 MG tablet Take 1 tablet (25 mg total) by mouth 2 (two) times daily. 60 tablet 6   No facility-administered medications prior to visit.     PAST MEDICAL HISTORY: Past Medical History:  Diagnosis Date  . CHF (congestive heart failure) ([place])   . CHF (congestive heart failure) ([place])   . Hyperlipidemia   . Hypertension   . Lupus ([place])   . Sicca ([place])     PAST SURGICAL HISTORY: Past Surgical History:  Procedure Laterality Date  . hystorectomy    . SINUS SURGERY WITH INSTATRAK  2015     FAMILY HISTORY: Family History  Problem Relation Age of Onset  . Hypertension Mother   . Diabetes Mother   . Kidney disease Mother   . Heart disease Mother   . High Cholesterol Mother   . Lung cancer Mother   . Hypertension Father   . Diabetes Father   . Kidney disease Father   . Heart disease Father     SOCIAL HISTORY:  Social History   Socioeconomic History  . Marital status: Divorced    Spouse name: Not on file  . Number of children: Not on file  . Years of education: Not on file  . Highest education level: Not on file  Occupational History  . Not on file  Social Needs  . Financial resource strain: Not on file  . Food insecurity:    Worry: Not on file    Inability: Not on file  . Transportation needs:    Medical: Not on file    Non-medical: Not on file  Tobacco Use  . Smoking status: Never Smoker  . Smokeless tobacco: Never Used  Substance and Sexual Activity  . Alcohol use: No  . Drug use: No  . Sexual activity: Yes    Partners: Male  Lifestyle  . Physical activity:    Days per week: Not on file    Minutes per session: Not on file  . Stress: Not on file  Relationships  . Social connections:    Talks on phone: Not on file    Gets together: Not on file    Attends religious service: Not on file    Active member of club or organization: Not on file    Attends meetings of clubs or organizations: Not on file    Relationship status: Not on file  . Intimate partner violence:    Fear of current or ex partner: Not on file    Emotionally abused: Not on file    Physically abused: Not on file    Forced sexual activity: Not on file  Other Topics Concern  . Not on file  Social History Narrative   Lives home alone.  Widow.    Is Disabled.  One child.  Sister, [name].     PHYSICAL EXAM  GENERAL EXAM/CONSTITUTIONAL: Vitals:  Vitals:   08/17/18 1357  BP: (!) 144/84  Pulse: 78  Weight: 243 lb 9.6 oz (110.5 kg)  Height: 5\' 5"  (1.651 m)   Body mass  index is 40.54 kg/m. No exam data present  Patient is in no distress; well developed, nourished and groomed; neck is supple  CARDIOVASCULAR:  Examination of carotid arteries is normal; no carotid bruits  Regular rate and rhythm, no murmurs  Examination of peripheral vascular system by observation and palpation is normal  EYES:  Ophthalmoscopic exam of optic discs and posterior segments is normal; no papilledema or hemorrhages  MUSCULOSKELETAL:  Gait, strength, tone, movements noted in Neurologic exam below  NEUROLOGIC: MENTAL STATUS:  No flowsheet data found.  awake, alert, oriented to person, place and time  recent and remote memory intact  normal attention and concentration  language fluent, comprehension intact, naming intact,   fund of knowledge appropriate  CRANIAL NERVE:   2nd - no papilledema on fundoscopic exam  2nd, 3rd, 4th, 6th - pupils equal and reactive to light, visual fields full to confrontation, extraocular muscles intact, no nystagmus  5th - facial sensation symmetric  7th - facial strength symmetric  8th - hearing intact  9th - palate elevates symmetrically, uvula midline  11th - shoulder shrug symmetric  12th - tongue protrusion midline  MOTOR:   normal bulk and tone, full strength in the BUE, BLE  SENSORY:   normal and symmetric to light touch, temperature, vibration  COORDINATION:   finger-nose-finger, fine finger movements normal  REFLEXES:   deep tendon reflexes TRACE and symmetric  GAIT/STATION:   narrow based gait; CAUTIOUS GAIT    DIAGNOSTIC DATA (LABS, IMAGING, TESTING) - I reviewed patient records, labs, notes, testing and imaging myself where available.  Lab Results  Component Value Date   WBC 12.9 (H) 03/11/2018   HGB 13.4 03/11/2018   HCT 40.4 03/11/2018   MCV 89 03/11/2018   PLT 217 03/11/2018      Component Value Date/Time   NA 143 03/11/2018 1034   K 4.7 03/11/2018 1034   CL 104 03/11/2018  1034   CO2 23 03/11/2018 1034   GLUCOSE 168 (H) 03/11/2018 1034   GLUCOSE 179 (H) 08/20/2016 1422   BUN 18 03/11/2018 1034   CREATININE 0.98 03/11/2018 1034   CREATININE 1.06 (H) 08/20/2016 1422   CALCIUM 9.9 03/11/2018 1034   PROT 6.8 03/11/2018 1034   ALBUMIN 4.5 03/11/2018 1034   AST 23 03/11/2018 1034   ALT 25 03/11/2018 1034   ALKPHOS 78 03/11/2018 1034   BILITOT 1.3 (H) 03/11/2018 1034   GFRNONAA 63 03/11/2018 1034   GFRAA 73 03/11/2018 1034   No results found for: CHOL, HDL, LDLCALC, LDLDIRECT, TRIG, CHOLHDL No results found for: HGBA1C No results found for: VITAMINB12 Lab Results  Component Value Date   TSH 1.13 07/12/2016    03/03/18 CT sinus [I reviewed images myself and agree with interpretation. -VRP]  - Normally aerated paranasal sinuses.  Patent sinus drainage pathways.  03/18/18 MRI brain  - Age normal brain MRI.  No explanation for headache.      ASSESSMENT AND PLAN  62 y.o. year old female here with history of migraine headaches with aura since age 80 years old, now with increasing severity and change in headaches in the past several months (since January 2019).    Dx:  1. Migraine with aura and without status migrainosus, not intractable   2. Other headache syndrome      PLAN:  HEADACHE PREVENTION (improved) - continue topiramate 25mg  at bedtime (? 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 topiramate (TOPAMAX) 25 MG tablet    Sig: Take 1 tablet (25 mg total) by mouth at bedtime.    Dispense:  90 tablet    Refill:  4  . rizatriptan (MAXALT-MLT) 10 MG disintegrating tablet  Sig: Take 1 tablet (10 mg total) by mouth as needed for migraine. May repeat in 2 hours if needed    Dispense:  9 tablet    Refill:  11   Return in about 1 year (around 08/18/2019) for with NP. or may  follow up with PCP if they agree to refill migraine meds     Penni Bombard, MD 93/02/7016, 7:93 PM Certified in Neurology, Neurophysiology and Neuroimaging  [facility] [address] [place], [place] [address] [phone]

## 2018-09-28 ENCOUNTER — Ambulatory Visit (INDEPENDENT_AMBULATORY_CARE_PROVIDER_SITE_OTHER): Payer: Medicare HMO | Admitting: Otolaryngology

## 2018-09-28 DIAGNOSIS — K219 Gastro-esophageal reflux disease without esophagitis: Secondary | ICD-10-CM

## 2018-09-28 DIAGNOSIS — R070 Pain in throat: Secondary | ICD-10-CM | POA: Diagnosis not present

## 2019-08-24 ENCOUNTER — Ambulatory Visit: Payer: Medicare HMO | Admitting: Family Medicine

## 2019-08-24 ENCOUNTER — Ambulatory Visit: Payer: Medicare HMO | Admitting: Adult Health

## 2020-04-23 LAB — HM DEXA SCAN

## 2020-05-24 DIAGNOSIS — I10 Essential (primary) hypertension: Secondary | ICD-10-CM | POA: Insufficient documentation

## 2020-06-07 DIAGNOSIS — E1122 Type 2 diabetes mellitus with diabetic chronic kidney disease: Secondary | ICD-10-CM | POA: Insufficient documentation

## 2020-10-10 ENCOUNTER — Other Ambulatory Visit (HOSPITAL_COMMUNITY): Payer: Self-pay | Admitting: Otolaryngology

## 2020-10-10 ENCOUNTER — Other Ambulatory Visit: Payer: Self-pay | Admitting: Otolaryngology

## 2020-10-10 DIAGNOSIS — J320 Chronic maxillary sinusitis: Secondary | ICD-10-CM

## 2020-10-14 DIAGNOSIS — Z86711 Personal history of pulmonary embolism: Secondary | ICD-10-CM | POA: Insufficient documentation

## 2020-10-25 ENCOUNTER — Ambulatory Visit (HOSPITAL_COMMUNITY): Admission: RE | Admit: 2020-10-25 | Payer: Medicare HMO | Source: Ambulatory Visit

## 2020-10-25 ENCOUNTER — Encounter (HOSPITAL_COMMUNITY): Payer: Self-pay

## 2020-11-03 ENCOUNTER — Ambulatory Visit (HOSPITAL_COMMUNITY)
Admission: RE | Admit: 2020-11-03 | Discharge: 2020-11-03 | Disposition: A | Discharge status: Home / Self Care | Payer: Medicare HMO | Source: Ambulatory Visit | Attending: Otolaryngology | Admitting: Otolaryngology

## 2020-11-03 ENCOUNTER — Other Ambulatory Visit: Payer: Self-pay

## 2020-11-03 DIAGNOSIS — J320 Chronic maxillary sinusitis: Secondary | ICD-10-CM | POA: Diagnosis present

## 2021-04-25 ENCOUNTER — Encounter: Payer: Self-pay | Admitting: *Deleted

## 2021-05-01 ENCOUNTER — Ambulatory Visit: Payer: Medicare HMO | Admitting: Diagnostic Neuroimaging

## 2021-05-01 ENCOUNTER — Encounter: Payer: Self-pay | Admitting: Diagnostic Neuroimaging

## 2021-05-01 VITALS — BP 128/77 | HR 80 | Ht 65.0 in | Wt 181.8 lb

## 2021-05-01 DIAGNOSIS — R21 Rash and other nonspecific skin eruption: Secondary | ICD-10-CM | POA: Diagnosis not present

## 2021-05-01 DIAGNOSIS — M329 Systemic lupus erythematosus, unspecified: Secondary | ICD-10-CM

## 2021-05-01 DIAGNOSIS — G501 Atypical facial pain: Secondary | ICD-10-CM

## 2021-05-01 MED ORDER — GABAPENTIN 100 MG PO CAPS: 100.0000 mg | ORAL_CAPSULE | Freq: Every day | ORAL | 6 refills | Status: DC

## 2021-05-01 NOTE — Patient Instructions (Addendum)
  LEFT FOREHEAD / LEFT PERI-ORBITAL PAIN (with rash; likely lupus rash and associated nerve pain; allergic reaction or cellulitis possible but less likely; shingles also less likely given duration and distribution)  - trial of gabapentin 100-300mg  at bedtime for nerve pain  - follow up with rheumatology re: rash / cellulitis evaluation; (consider prednisone dose pack?)

## 2021-05-01 NOTE — Progress Notes (Signed)
[facility]  PATIENT: Joy Leach DOB: 1956/02/02  REFERRING CLINICIAN: Leta Baptist, MD  HISTORY FROM: patient  and sister REASON FOR VISIT: follow up   HISTORICAL  CHIEF COMPLAINT:  Chief Complaint  Patient presents with   Migraine    Rm 6, referred for headaches/facial pain, sister- Loletha Carrow "CT scan of sinuses was clear/maybe allergiesDr Teoh; left eye swollen, itching- saw dermatologist; saw eye dr who said it was from lupus, put on antibiotics but it came back"     HISTORY OF PRESENT ILLNESS:   UPDATE (05/01/21, VRP): Since last visit, now with new rash in left forehead, left eye, since April 2022. Saw ophthal, rheum, derm; no dx found. Tried abx in left eye with mild relief. Rash in spreading more. Now with nerve pain in left V1 distribution. Now on MTX instead of plaquenil. Migraines continue, but increased (10-15 per month).   Had 2nd opinion for lupus at [facility], and labs were negative; therefore was recommended to wean off MTX, but patient decided to stay with primary rheumatology and continue treatments.   UPDATE (08/17/18, VRP): Since last visit, doing well. Symptoms are improved. Severity is mild. No alleviating or aggravating factors. Tolerating TPX. Only a few HA since last visit. Rain and lack of sleep trigger HA. Rizatriptan helps. Also asking about neck swelling in head / neck. On chronic prednisone.   PRIOR HPI (03/11/18): 65 year old female here for evaluation of headaches.  History of lupus, hyperglycemia, hypertension, sicca syndrome, congestive heart failure.  Patient has history of headaches since age 36 years old.  She describes frontal throbbing severe headaches with nausea, photophobia, phonophobia and visual aura.  Over the last several months patient has had increase in frequency of headaches.  Previously patient was having one headache per month, and now patient having 20-25 headaches per month.  Headaches are more severe.  No specific  triggering factors.   REVIEW OF SYSTEMS: Full 14 system review of systems performed and negative with exception of: headache dizziness chills cough.    ALLERGIES: Allergies  Allergen Reactions   Sulfa Antibiotics Hives   Caffeine Other (See Comments)    Bladder infections   Codeine Nausea And Vomiting    HOME MEDICATIONS: Outpatient Medications Prior to Visit  Medication Sig Dispense Refill   atorvastatin (LIPITOR) 10 MG tablet Take 10 mg by mouth daily.     Cholecalciferol (VITAMIN D3) 3000 units TABS Take 50,000 Units by mouth daily.     Cinnamon 500 MG capsule Take 500 mg by mouth 2 (two) times daily.     diclofenac sodium (VOLTAREN) 1 % GEL 3 (three) times daily.      ELIQUIS 5 MG TABS tablet Take 5 mg by mouth 2 (two) times daily.     famotidine (PEPCID) 20 MG tablet Take 20 mg by mouth daily.     famotidine (PEPCID) 40 MG tablet Take 1 tablet by mouth daily.  3   fenofibrate (TRICOR) 145 MG tablet Take 145 mg by mouth daily.     folic acid (FOLVITE) 1 MG tablet Take 1 mg by mouth daily.     furosemide (LASIX) 20 MG tablet TAKE (1) TABLET BY MOUTH ONCE DAILY AS NEEDED. 30 tablet 0   JANUMET 50-1000 MG tablet Take 1 tablet by mouth 2 (two) times daily.     loperamide (IMODIUM A-D) 2 MG tablet Take 2 mg by mouth 2 (two) times daily.     losartan (COZAAR) 50 MG tablet Take 1 tablet (50 mg  total) by mouth daily. 90 tablet 3   methotrexate 2.5 MG tablet Take 20 mg by mouth once a week.     montelukast (SINGULAIR) 10 MG tablet Take 1 tablet by mouth daily.  2   ondansetron (ZOFRAN) 4 MG tablet Take 1-2 tablets (4-8 mg total) by mouth every 8 (eight) hours as needed for nausea or vomiting. 30 tablet 3   predniSONE (DELTASONE) 10 MG tablet Take 10 mg by mouth daily with breakfast.     Turmeric 500 MG CAPS Take 400 mg by mouth 2 (two) times daily.     UNABLE TO FIND Med Name: anti diarrheal daily     BEVESPI AEROSPHERE 9-4.8 MCG/ACT AERO 2 puffs two times daily (Patient not taking:  Reported on 05/01/2021)     calcium-vitamin D (OSCAL WITH D) 500-200 MG-UNIT tablet Take 1 tablet by mouth. (Patient not taking: Reported on 05/01/2021)     HYDRALAZINE HCL PO Take by mouth. As needed (Patient not taking: Reported on 05/01/2021)     hydroxychloroquine (PLAQUENIL) 200 MG tablet Take 400 mg by mouth daily. (Patient not taking: Reported on 05/01/2021)     loratadine (CLARITIN) 10 MG tablet Take 1 tablet by mouth daily. (Patient not taking: Reported on 05/01/2021)  2   metFORMIN (GLUCOPHAGE) 500 MG tablet Take 1 tablet by mouth daily. (Patient not taking: Reported on 05/01/2021)  0   Multiple Vitamin (MULTIVITAMIN) tablet Take 1 tablet by mouth daily. (Patient not taking: Reported on 05/01/2021)     Omega-3 Fatty Acids (FISH OIL) 1000 MG CAPS Take 1,000 mg by mouth 2 (two) times daily. (Patient not taking: Reported on 05/01/2021)     omeprazole (PRILOSEC) 20 MG capsule Take 20 mg by mouth daily. (Patient not taking: Reported on 05/01/2021)     pilocarpine (SALAGEN) 5 MG tablet Take 5 mg by mouth 3 (three) times daily. (Patient not taking: Reported on 05/01/2021)     rizatriptan (MAXALT-MLT) 10 MG disintegrating tablet Take 1 tablet (10 mg total) by mouth as needed for migraine. May repeat in 2 hours if needed (Patient not taking: Reported on 05/01/2021) 9 tablet 11   topiramate (TOPAMAX) 25 MG tablet Take 1 tablet (25 mg total) by mouth at bedtime. (Patient not taking: Reported on 05/01/2021) 90 tablet 4   No facility-administered medications prior to visit.    PAST MEDICAL HISTORY: Past Medical History:  Diagnosis Date   CHF (congestive heart failure) ([facility])    Headache    Hyperlipidemia    Hypertension    Lupus ([facility])    Sicca ([facility])     PAST SURGICAL HISTORY: Past Surgical History:  Procedure Laterality Date   hystorectomy     SINUS SURGERY WITH INSTATRAK  2015    FAMILY HISTORY: Family History  Problem Relation Age of Onset   Hypertension Mother    Diabetes Mother    Kidney  disease Mother    Heart disease Mother    High Cholesterol Mother    Lung cancer Mother    Hypertension Father    Diabetes Father    Kidney disease Father    Heart disease Father     SOCIAL HISTORY:  Social History   Socioeconomic History   Marital status: Divorced    Spouse name: Not on file   Number of children: 1   Years of education: Not on file   Highest education level: Not on file  Occupational History   Not on file  Tobacco Use   Smoking status: Never  Smokeless tobacco: Never  Substance and Sexual Activity   Alcohol use: No   Drug use: No   Sexual activity: Yes    Partners: Male  Other Topics Concern   Not on file  Social History Narrative   Lives home alone.  Widow.    Is Disabled.  One child.  Sister, [name].   Social Determinants of Health   Financial Resource Strain: Not on file  Food Insecurity: Not on file  Transportation Needs: Not on file  Physical Activity: Not on file  Stress: Not on file  Social Connections: Not on file  Intimate Partner Violence: Not on file     PHYSICAL EXAM  GENERAL EXAM/CONSTITUTIONAL: Vitals:  Vitals:   05/01/21 0915  BP: 128/77  Pulse: 80  Weight: 181 lb 12.8 oz (82.5 kg)  Height: 5\' 5"  (1.651 m)   Body mass index is 30.25 kg/m. No results found. Patient is in no distress; well developed, nourished and groomed; neck is supple MACULES, PAPULES IN LEFT > RIGHT FOREHEAD AND UPPER EYELIDS; MILD SWELLING IN LEFT > RIGHT EYELID; ALSO ON NASAL BRIDGE BILATERALLY  CARDIOVASCULAR: Examination of carotid arteries is normal; no carotid bruits Regular rate and rhythm, no murmurs Examination of peripheral vascular system by observation and palpation is normal  EYES: Ophthalmoscopic exam of optic discs and posterior segments is normal; no papilledema or hemorrhages  MUSCULOSKELETAL: Gait, strength, tone, movements noted in Neurologic exam below  NEUROLOGIC: MENTAL STATUS:  No flowsheet data found. awake, alert,  oriented to person, place and time recent and remote memory intact normal attention and concentration language fluent, comprehension intact, naming intact,  fund of knowledge appropriate  CRANIAL NERVE:  2nd - no papilledema on fundoscopic exam 2nd, 3rd, 4th, 6th - pupils equal and reactive to light, visual fields full to confrontation, extraocular muscles intact, no nystagmus 5th - facial sensation symmetric 7th - facial strength symmetric 8th - hearing intact 9th - palate elevates symmetrically, uvula midline 11th - shoulder shrug symmetric 12th - tongue protrusion midline  MOTOR:  normal bulk and tone, full strength in the BUE, BLE  SENSORY:  normal and symmetric to light touch, temperature, vibration  COORDINATION:  finger-nose-finger, fine finger movements normal  REFLEXES:  deep tendon reflexes TRACE and symmetric  GAIT/STATION:  narrow based gait; CAUTIOUS GAIT    DIAGNOSTIC DATA (LABS, IMAGING, TESTING) - I reviewed patient records, labs, notes, testing and imaging myself where available.  Lab Results  Component Value Date   WBC 12.9 (H) 03/11/2018   HGB 13.4 03/11/2018   HCT 40.4 03/11/2018   MCV 89 03/11/2018   PLT 217 03/11/2018      Component Value Date/Time   NA 143 03/11/2018 1034   K 4.7 03/11/2018 1034   CL 104 03/11/2018 1034   CO2 23 03/11/2018 1034   GLUCOSE 168 (H) 03/11/2018 1034   GLUCOSE 179 (H) 08/20/2016 1422   BUN 18 03/11/2018 1034   CREATININE 0.98 03/11/2018 1034   CREATININE 1.06 (H) 08/20/2016 1422   CALCIUM 9.9 03/11/2018 1034   PROT 6.8 03/11/2018 1034   ALBUMIN 4.5 03/11/2018 1034   AST 23 03/11/2018 1034   ALT 25 03/11/2018 1034   ALKPHOS 78 03/11/2018 1034   BILITOT 1.3 (H) 03/11/2018 1034   GFRNONAA 63 03/11/2018 1034   GFRAA 73 03/11/2018 1034   No results found for: CHOL, HDL, LDLCALC, LDLDIRECT, TRIG, CHOLHDL No results found for: HGBA1C No results found for: [id number] Lab Results  Component Value Date  TSH 1.13 07/12/2016    03/03/18 CT sinus [I reviewed images myself and agree with interpretation. -VRP]  - Normally aerated paranasal sinuses.  Patent sinus drainage pathways.  03/18/18 MRI brain  - Age normal brain MRI.  No explanation for headache.        ASSESSMENT AND PLAN  65 y.o. year old female here with history of migraine headaches with aura since age 42 years old, now with increasing severity and change in headaches in the past several months (since January 2019).    Dx:  1. Facial rash   2. Lupus ([place])   3. Atypical facial pain      PLAN:  LEFT FOREHEAD / LEFT PERI-ORBITAL PAIN (with rash; likely lupus rash and associated nerve pain; allergic reaction or cellulitis possible but less likely; shingles also less likely given duration and distribution) - trial of gabapentin 300mg  at bedtime for nerve pain - follow up with rheumatology re: rash / cellulitis evaluation; (consider prednisone dose pack?)  HEADACHE PREVENTION - continue topiramate 25mg  at bedtime (? 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    Dispense:  90 capsule    Refill:  6    Return in about 6 months (around 11/01/2021).      Penni Bombard, MD 5/85/9292, 44:62 AM Certified in Neurology, Neurophysiology and Neuroimaging  [facility] [address], [place], [place] [address] [phone]

## 2021-09-29 ENCOUNTER — Encounter (HOSPITAL_COMMUNITY): Payer: Self-pay | Admitting: *Deleted

## 2021-09-29 ENCOUNTER — Emergency Department (HOSPITAL_COMMUNITY)
Admission: EM | Admit: 2021-09-29 | Discharge: 2021-09-29 | Disposition: A | Discharge status: Home / Self Care | Payer: Medicare HMO | Attending: Emergency Medicine | Admitting: Emergency Medicine

## 2021-09-29 ENCOUNTER — Emergency Department (HOSPITAL_COMMUNITY): Payer: Medicare HMO

## 2021-09-29 DIAGNOSIS — Z79899 Other long term (current) drug therapy: Secondary | ICD-10-CM | POA: Diagnosis not present

## 2021-09-29 DIAGNOSIS — Z20822 Contact with and (suspected) exposure to covid-19: Secondary | ICD-10-CM | POA: Diagnosis not present

## 2021-09-29 DIAGNOSIS — I509 Heart failure, unspecified: Secondary | ICD-10-CM | POA: Diagnosis not present

## 2021-09-29 DIAGNOSIS — Z7901 Long term (current) use of anticoagulants: Secondary | ICD-10-CM | POA: Diagnosis not present

## 2021-09-29 DIAGNOSIS — R112 Nausea with vomiting, unspecified: Secondary | ICD-10-CM | POA: Diagnosis present

## 2021-09-29 DIAGNOSIS — R109 Unspecified abdominal pain: Secondary | ICD-10-CM | POA: Insufficient documentation

## 2021-09-29 DIAGNOSIS — R197 Diarrhea, unspecified: Secondary | ICD-10-CM | POA: Insufficient documentation

## 2021-09-29 DIAGNOSIS — I110 Hypertensive heart disease with heart failure: Secondary | ICD-10-CM | POA: Insufficient documentation

## 2021-09-29 LAB — CBC WITH DIFFERENTIAL/PLATELET
Abs Immature Granulocytes: 0.08 10*3/uL — ABNORMAL HIGH (ref 0.00–0.07)
Basophils Absolute: 0 10*3/uL (ref 0.0–0.1)
Basophils Relative: 1 %
Eosinophils Absolute: 0.1 10*3/uL (ref 0.0–0.5)
Eosinophils Relative: 1 %
HCT: 39.6 % (ref 36.0–46.0)
Hemoglobin: 12.2 g/dL (ref 12.0–15.0)
Immature Granulocytes: 1 %
Lymphocytes Relative: 11 %
Lymphs Abs: 0.8 10*3/uL (ref 0.7–4.0)
MCH: 28 pg (ref 26.0–34.0)
MCHC: 30.8 g/dL (ref 30.0–36.0)
MCV: 91 fL (ref 80.0–100.0)
Monocytes Absolute: 0.9 10*3/uL (ref 0.1–1.0)
Monocytes Relative: 13 %
Neutro Abs: 5.4 10*3/uL (ref 1.7–7.7)
Neutrophils Relative %: 73 %
Platelets: 220 10*3/uL (ref 150–400)
RBC: 4.35 MIL/uL (ref 3.87–5.11)
RDW: 14.3 % (ref 11.5–15.5)
WBC: 7.3 10*3/uL (ref 4.0–10.5)
nRBC: 0 % (ref 0.0–0.2)

## 2021-09-29 LAB — COMPREHENSIVE METABOLIC PANEL
ALT: 35 U/L (ref 0–44)
AST: 38 U/L (ref 15–41)
Albumin: 3.6 g/dL (ref 3.5–5.0)
Alkaline Phosphatase: 57 U/L (ref 38–126)
Anion gap: 8 (ref 5–15)
BUN: 14 mg/dL (ref 8–23)
CO2: 28 mmol/L (ref 22–32)
Calcium: 10.5 mg/dL — ABNORMAL HIGH (ref 8.9–10.3)
Chloride: 100 mmol/L (ref 98–111)
Creatinine, Ser: 1.06 mg/dL — ABNORMAL HIGH (ref 0.44–1.00)
GFR, Estimated: 59 mL/min — ABNORMAL LOW (ref >60)
Glucose, Bld: 202 mg/dL — ABNORMAL HIGH (ref 70–99)
Potassium: 4.1 mmol/L (ref 3.5–5.1)
Sodium: 136 mmol/L (ref 135–145)
Total Bilirubin: 1.2 mg/dL (ref 0.3–1.2)
Total Protein: 6.1 g/dL — ABNORMAL LOW (ref 6.5–8.1)

## 2021-09-29 LAB — RESP PANEL BY RT-PCR (FLU A&B, COVID) ARPGX2
Influenza A by PCR: NEGATIVE
Influenza B by PCR: NEGATIVE
SARS Coronavirus 2 by RT PCR: NEGATIVE

## 2021-09-29 LAB — LIPASE, BLOOD: Lipase: 41 U/L (ref 11–51)

## 2021-09-29 MED ORDER — ALBUTEROL (5 MG/ML) CONTINUOUS INHALATION SOLN: 10.0000 mg/h | INHALATION_SOLUTION | Freq: Once | RESPIRATORY_TRACT | Status: DC

## 2021-09-29 MED ORDER — ALBUTEROL SULFATE (2.5 MG/3ML) 0.083% IN NEBU
INHALATION_SOLUTION | RESPIRATORY_TRACT | Status: AC
  Filled 2021-09-29: qty 12

## 2021-09-29 MED ORDER — IOHEXOL 300 MG/ML  SOLN
100.0000 mL | Freq: Once | INTRAMUSCULAR | Status: AC | PRN
  Administered 2021-09-29: 100 mL via INTRAVENOUS

## 2021-09-29 MED ORDER — SODIUM CHLORIDE 0.9 % IV SOLN
12.5000 mg | Freq: Once | INTRAVENOUS | Status: DC
  Filled 2021-09-29: qty 0.5

## 2021-09-29 MED ORDER — MAGNESIUM SULFATE 2 GM/50ML IV SOLN: 2.0000 g | Freq: Once | INTRAVENOUS | Status: DC

## 2021-09-29 MED ORDER — SODIUM CHLORIDE 0.9 % IV SOLN: INTRAVENOUS | Status: DC

## 2021-09-29 MED ORDER — PROMETHAZINE HCL 25 MG/ML IJ SOLN
INTRAMUSCULAR | Status: AC
  Filled 2021-09-29: qty 1

## 2021-09-29 MED ORDER — FENTANYL CITRATE PF 50 MCG/ML IJ SOSY: 50.0000 ug | PREFILLED_SYRINGE | Freq: Once | INTRAMUSCULAR | Status: DC

## 2021-09-29 NOTE — ED Triage Notes (Signed)
Abdominal pain with nausea and vomiting onset this am

## 2021-09-29 NOTE — Discharge Instructions (Signed)
Start with a clear liquid diet and gradually advance to regular foods as tolerated.  Call the GI doctor for follow-up appointment as soon as possible.  Use your Phenergan tablets for nausea and vomiting.

## 2021-09-29 NOTE — ED Notes (Signed)
Ambulatory to restroom

## 2021-09-29 NOTE — ED Notes (Signed)
Refused phenergan iv. Said she can take it po when she gets home.

## 2021-09-29 NOTE — ED Provider Notes (Signed)
[facility] Provider Note   CSN: [id number] Arrival date & time: 09/29/21  1513     History Chief Complaint  Patient presents with   Abdominal Pain    Joy Leach is a 65 y.o. female.  HPI She presents for evaluation of abdominal pain with nausea and vomiting which started today.  She is also had a similar episode, 5 days ago that lasted 1 day and resolved spontaneously.  She was recently completed treatment for complicated facial/orbital cellulitis and abscess.  Currently off antibiotics.  This incident was apparently associated initially with a shingles infection.  Currently she denies fever, chills, blood in emesis, cough or shortness of breath.  She states her emesis today was green.  She has had some loose bowel movements recently but cannot recall having a diarrheal stool today.  She is here with her sister.  The sister reports that patient previously had vomiting and abdominal pain when she had a "slight gallbladder problem."  Her sister helps to give history.  There are no other known active modifying factors.    Past Medical History:  Diagnosis Date   CHF (congestive heart failure) ([place])    Headache    Hyperlipidemia    Hypertension    Lupus ([place])    Sicca ([place])     Patient Active Problem List   Diagnosis Date Noted   Hyperlipidemia 06/12/2016   Anorexia 06/12/2016   Edema 06/12/2016   Otalgia 06/12/2016   Wheezing 06/12/2016   SOB (shortness of breath) 06/12/2016    Past Surgical History:  Procedure Laterality Date   hystorectomy     SINUS SURGERY WITH INSTATRAK  2015     OB History   No obstetric history on file.     Family History  Problem Relation Age of Onset   Hypertension Mother    Diabetes Mother    Kidney disease Mother    Heart disease Mother    High Cholesterol Mother    Lung cancer Mother    Hypertension Father    Diabetes Father    Kidney disease Father    Heart disease Father     Social History   Tobacco  Use   Smoking status: Never   Smokeless tobacco: Never  Substance Use Topics   Alcohol use: No   Drug use: No    Home Medications Prior to Admission medications   Medication Sig Start Date End Date Taking? Authorizing Provider  atorvastatin (LIPITOR) 10 MG tablet Take 10 mg by mouth daily.   Yes [provider]  BEVESPI AEROSPHERE 9-4.8 MCG/ACT AERO 2 puffs two times daily 12/04/17  Yes [provider]  Cholecalciferol (VITAMIN D3) 3000 units TABS Take 50,000 Units by mouth daily.   Yes [provider]  Cinnamon 500 MG capsule Take 500 mg by mouth 2 (two) times daily.   Yes [provider]  ELIQUIS 5 MG TABS tablet Take 5 mg by mouth 2 (two) times daily. 03/26/21  Yes [provider]  fenofibrate (TRICOR) 145 MG tablet Take 145 mg by mouth daily. 03/13/21  Yes [provider]  folic acid (FOLVITE) 1 MG tablet Take 1 mg by mouth daily. 04/11/21  Yes [provider]  furosemide (LASIX) 20 MG tablet TAKE (1) TABLET BY MOUTH ONCE DAILY AS NEEDED. Patient taking differently: Take 20 mg by mouth daily as needed for fluid. 06/19/17  Yes Lendon Colonel, NP  gabapentin (NEURONTIN) 800 MG tablet Take 800 mg by mouth 2 (two)  times daily. 07/18/21  Yes [provider]  JANUMET 50-1000 MG tablet Take 1 tablet by mouth 2 (two) times daily. 04/24/21  Yes [provider]  loperamide (IMODIUM A-D) 2 MG tablet Take 2 mg by mouth 2 (two) times daily.   Yes [provider]  loratadine (CLARITIN) 10 MG tablet Take 1 tablet by mouth daily. 07/20/18  Yes [provider]  montelukast (SINGULAIR) 10 MG tablet Take 1 tablet by mouth daily. 08/03/18  Yes [provider]  Multiple Vitamin (MULTIVITAMIN) tablet Take 1 tablet by mouth daily.   Yes [provider]  omeprazole (PRILOSEC) 20 MG capsule Take 20 mg by mouth daily.   Yes [provider]  ondansetron (ZOFRAN) 4 MG tablet Take 1-2 tablets  (4-8 mg total) by mouth every 8 (eight) hours as needed for nausea or vomiting. 03/11/18  Yes Penumalli, Earlean Polka, MD  predniSONE (DELTASONE) 10 MG tablet Take 10 mg by mouth daily with breakfast.   Yes [provider]  promethazine (PHENERGAN) 25 MG tablet Take 25-50 mg by mouth every 8 (eight) hours as needed. 09/18/21  Yes [provider]  REFRESH PLUS 0.5 % SOLN  07/18/21  Yes [provider]  Turmeric 500 MG CAPS Take 400 mg by mouth 2 (two) times daily.   Yes [provider]  UNABLE TO FIND Med Name: anti diarrheal daily   Yes [provider]  calcium-vitamin D (OSCAL WITH D) 500-200 MG-UNIT tablet Take 1 tablet by mouth. Patient not taking: Reported on 09/29/2021    [provider]  diclofenac sodium (VOLTAREN) 1 % GEL 3 (three) times daily.  Patient not taking: Reported on 09/29/2021 02/16/18   [provider]  famotidine (PEPCID) 20 MG tablet Take 20 mg by mouth daily. Patient not taking: Reported on 09/29/2021 03/26/21   [provider]  famotidine (PEPCID) 40 MG tablet Take 1 tablet by mouth daily. Patient not taking: Reported on 09/29/2021 07/20/18   [provider]  gabapentin (NEURONTIN) 100 MG capsule Take 1-3 capsules (100-300 mg total) by mouth at bedtime. Patient not taking: Reported on 09/29/2021 05/01/21   Penumalli, Earlean Polka, MD  HYDRALAZINE HCL PO Take by mouth. As needed Patient not taking: Reported on 05/01/2021    [provider]  HYDROcodone-acetaminophen (NORCO/VICODIN) 5-325 MG tablet Take 1 tablet by mouth daily as needed. 08/02/21   [provider]  hydroxychloroquine (PLAQUENIL) 200 MG tablet Take 400 mg by mouth daily. Patient not taking: Reported on 05/01/2021    [provider]  losartan (COZAAR) 50 MG tablet Take 1 tablet (50 mg total) by mouth daily. Patient not taking: Reported on 09/29/2021 12/11/16   Josue Hector, MD  metFORMIN (GLUCOPHAGE) 500 MG tablet  Take 1 tablet by mouth daily. Patient not taking: Reported on 05/01/2021 05/28/18   [provider]  methotrexate 2.5 MG tablet Take 20 mg by mouth once a week. Patient not taking: Reported on 09/29/2021 04/14/21   [provider]  Omega-3 Fatty Acids (FISH OIL) 1000 MG CAPS Take 1,000 mg by mouth 2 (two) times daily. Patient not taking: Reported on 05/01/2021    [provider]  pilocarpine (SALAGEN) 5 MG tablet Take 5 mg by mouth 3 (three) times daily. Patient not taking: Reported on 05/01/2021    [provider]  rizatriptan (MAXALT-MLT) 10 MG disintegrating tablet Take 1 tablet (10 mg total) by mouth as needed for migraine. May repeat in 2 hours if needed Patient not taking:  Reported on 05/01/2021 08/17/18   Penumalli, Earlean Polka, MD  topiramate (TOPAMAX) 25 MG tablet Take 1 tablet (25 mg total) by mouth at bedtime. Patient not taking: Reported on 05/01/2021 08/17/18   Penni Bombard, MD    Allergies    Sulfa antibiotics, Caffeine, and Codeine  Review of Systems   Review of Systems  All other systems reviewed and are negative.  Physical Exam Updated Vital Signs BP 124/68    Pulse 80    Temp (!) 97.3 F (36.3 C) (Oral)    Resp 20    SpO2 94%   Physical Exam Vitals and nursing note reviewed.  Constitutional:      General: She is not in acute distress.    Appearance: She is well-developed. She is not ill-appearing, toxic-appearing or diaphoretic.  HENT:     Head: Normocephalic and atraumatic.     Right Ear: External ear normal.     Left Ear: External ear normal.  Eyes:     Conjunctiva/sclera: Conjunctivae normal.     Pupils: Pupils are equal, round, and reactive to light.  Neck:     Trachea: Phonation normal.  Cardiovascular:     Rate and Rhythm: Normal rate and regular rhythm.     Heart sounds: Normal heart sounds.  Pulmonary:     Effort: Pulmonary effort is normal.     Breath sounds: Normal breath sounds.  Chest:     Chest wall: No  tenderness.  Abdominal:     General: Bowel sounds are normal. There is no distension.     Palpations: Abdomen is soft. There is no mass.     Tenderness: There is abdominal tenderness (Diffuse, mild). There is no guarding.     Hernia: No hernia is present.  Musculoskeletal:        General: Normal range of motion.     Cervical back: Normal range of motion and neck supple.  Skin:    General: Skin is warm and dry.     Comments: Slight erythema on the skin around the left eye including forehead and cheek.  Skin defect at this site, appears to be an old wound, with a small, subacute appearing eschar in the center of it.  This area is nontender to palpation.  Neurological:     Mental Status: She is alert and oriented to person, place, and time.     Cranial Nerves: No cranial nerve deficit.     Sensory: No sensory deficit.     Motor: No abnormal muscle tone.     Coordination: Coordination normal.  Psychiatric:        Mood and Affect: Mood normal.        Behavior: Behavior normal.        Thought Content: Thought content normal.        Judgment: Judgment normal.    ED Results / Procedures / Treatments   Labs (all labs ordered are listed, but only abnormal results are displayed) Labs Reviewed  COMPREHENSIVE METABOLIC PANEL - Abnormal; Notable for the following components:      Result Value   Glucose, Bld 202 (*)    Creatinine, Ser 1.06 (*)    Calcium 10.5 (*)    Total Protein 6.1 (*)    GFR, Estimated 59 (*)    All other components within normal limits  CBC WITH DIFFERENTIAL/PLATELET - Abnormal; Notable for the following components:   Abs Immature Granulocytes 0.08 (*)    All other components within normal limits  RESP  PANEL BY RT-PCR (FLU A&B, COVID) ARPGX2  LIPASE, BLOOD    EKG None  Radiology CT Abdomen Pelvis W Contrast  Result Date: 09/29/2021 CLINICAL DATA:  Acute abdominal pain. EXAM: CT ABDOMEN AND PELVIS WITH CONTRAST TECHNIQUE: Multidetector CT imaging of the abdomen  and pelvis was performed using the standard protocol following bolus administration of intravenous contrast. CONTRAST:  139mL OMNIPAQUE IOHEXOL 300 MG/ML  SOLN COMPARISON:  CT abdomen and pelvis 04/19/2015. FINDINGS: Lower chest: There is atelectasis in the lung bases. Hepatobiliary: There is fatty infiltration of the liver. No focal liver lesions are identified. Gallbladder and bile ducts are within normal limits. Pancreas: Unremarkable. No pancreatic ductal dilatation or surrounding inflammatory changes. Spleen: Borderline enlarged. Adrenals/Urinary Tract: There are punctate nonobstructing bilateral renal calculi measuring up to 3 mm. There is no hydronephrosis or perinephric fluid. The adrenal glands and bladder are within normal limits. Stomach/Bowel: Stomach is within normal limits. Appendix appears normal. No evidence of bowel wall thickening, distention, or inflammatory changes. There is sigmoid colon diverticulosis without evidence for acute diverticulitis. Vascular/Lymphatic: Aortic atherosclerosis. No enlarged abdominal or pelvic lymph nodes. Reproductive: Status post hysterectomy. No adnexal masses. Other: There is a small fat containing umbilical hernia. There is no free fluid or free air. Musculoskeletal: No acute or significant osseous findings. IMPRESSION: 1. No acute localizing process in the abdomen or pelvis. 2. Fatty infiltration of the liver. 3. Nonobstructing bilateral renal calculi. 4. Borderline splenomegaly. 5. Sigmoid colon diverticulosis. 6.  Aortic Atherosclerosis (ICD10-I70.0). Electronically Signed   By: Ronney Asters M.D.   On: 09/29/2021 18:01   DG Chest Port 1 View  Result Date: 09/29/2021 CLINICAL DATA:  Dyspnea. EXAM: PORTABLE CHEST 1 VIEW COMPARISON:  CT of the chest 07/03/2018. FINDINGS: The heart size and mediastinal contours are within normal limits. There is linear atelectasis or scarring in both lung bases. The lungs are otherwise clear. No pleural effusion or  pneumothorax. The visualized skeletal structures are unremarkable. IMPRESSION: No active disease. Electronically Signed   By: Ronney Asters M.D.   On: 09/29/2021 16:12    Procedures Procedures   Medications Ordered in ED Medications  0.9 %  sodium chloride infusion ( Intravenous New Bag/Given 09/29/21 1633)  albuterol (PROVENTIL) (2.5 MG/3ML) 0.083% nebulizer solution (  Not Given 09/29/21 1627)  promethazine (PHENERGAN) 12.5 mg in sodium chloride 0.9 % 50 mL IVPB (has no administration in time range)  iohexol (OMNIPAQUE) 300 MG/ML solution 100 mL (100 mLs Intravenous Contrast Given 09/29/21 1720)    ED Course  I have reviewed the triage vital signs and the nursing notes.  Pertinent labs & imaging results that were available during my care of the patient were reviewed by me and considered in my medical decision making (see chart for details).     MDM Rules/Calculators/A&P                          Patient Vitals for the past 24 hrs:  BP Temp Temp src Pulse Resp SpO2  09/29/21 1930 124/68 -- -- 80 20 94 %  09/29/21 1900 130/68 -- -- 81 18 94 %  09/29/21 1830 124/82 -- -- 78 20 95 %  09/29/21 1800 126/70 -- -- 73 20 91 %  09/29/21 1630 130/79 -- -- 77 15 95 %  09/29/21 1600 128/70 -- -- 73 15 96 %  09/29/21 1528 (!) 140/91 (!) 97.3 F (36.3 C) Oral 81 18 98 %    At that time  of discharge- reevaluation with update and discussion. After initial assessment and treatment, an updated evaluation reveals she is comfortable and not actively vomiting.  She is tolerating some oral liquids.  Findings discussed with patient, and family member.  All questions answered. Daleen Bo   Medical Decision Making:  This patient is presenting for evaluation of abdominal pain with vomiting, which does require a range of treatment options, and is a complaint that involves a moderate risk of morbidity and mortality. The differential diagnoses include gastritis, gallbladder disease, intestinal  disorders. 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  I obtained additional historical information from sister at the bedside.  Clinical Laboratory Tests Ordered, included CBC, Metabolic panel, and lipase, viral panel . Review indicates normal except glucose high, creatinine high, calcium high, total protein low. Radiologic Tests Ordered, included chest x-ray, CT abdomen pelvis.  I independently Visualized: Radiographic images, which show no acute abnormalities  Cardiac Monitor Tracing which shows normal sinus rhythm  Critical Interventions-clinical evaluation, laboratory testing, radiography, IV fluids, albuterol nebulizer, IV Phenergan, observation and reassessment  After These Interventions, the Patient was reevaluated and was found improved and comfortable.  Nonspecific symptoms.  No evidence for worsening facial cellulitis or sepsis.  No evidence for significant metabolic disorder, requiring intervention or hospitalization.  Doubt acute abdominal visceral obstruction or mesenteric ischemia.  Stable for discharge with outpatient management.  Symptomatic treatment indicated.  CRITICAL CARE-no Performed by: Daleen Bo  Nursing Notes Reviewed/ Care Coordinated Applicable Imaging Reviewed Interpretation of Laboratory Data incorporated into ED treatment  The patient appears reasonably screened and/or stabilized for discharge and I doubt any other medical condition or other [facility] requiring further screening, evaluation, or treatment in the ED at this time prior to discharge.  Plan: Home Medications-continue usual including antiemetic; Home Treatments-gradual advance diet and activity; return here if the recommended treatment, does not improve the symptoms; Recommended follow up-PCP, as  needed.        Final Clinical Impression(s) / ED Diagnoses Final diagnoses:  Nausea and vomiting, unspecified vomiting type    Rx / DC Orders ED Discharge Orders     None        Daleen Bo, MD 09/30/21 [phone]

## 2021-09-29 NOTE — ED Notes (Signed)
Rec'd d/c papers. All questions answered. Assisted to lobby.

## 2021-10-18 ENCOUNTER — Encounter (INDEPENDENT_AMBULATORY_CARE_PROVIDER_SITE_OTHER): Payer: Self-pay | Admitting: Gastroenterology

## 2021-10-18 ENCOUNTER — Ambulatory Visit (INDEPENDENT_AMBULATORY_CARE_PROVIDER_SITE_OTHER): Payer: Medicare HMO | Admitting: Gastroenterology

## 2021-10-18 ENCOUNTER — Other Ambulatory Visit: Payer: Self-pay

## 2021-10-18 VITALS — BP 110/77 | HR 120 | Temp 99.3°F | Ht 65.0 in | Wt 163.4 lb

## 2021-10-18 DIAGNOSIS — R1114 Bilious vomiting: Secondary | ICD-10-CM | POA: Diagnosis not present

## 2021-10-18 DIAGNOSIS — K219 Gastro-esophageal reflux disease without esophagitis: Secondary | ICD-10-CM | POA: Diagnosis not present

## 2021-10-18 DIAGNOSIS — K921 Melena: Secondary | ICD-10-CM

## 2021-10-18 DIAGNOSIS — R634 Abnormal weight loss: Secondary | ICD-10-CM

## 2021-10-18 MED ORDER — PANTOPRAZOLE SODIUM 40 MG PO TBEC: 40.0000 mg | DELAYED_RELEASE_TABLET | Freq: Every day | ORAL | 3 refills | Status: DC

## 2021-10-18 NOTE — Patient Instructions (Addendum)
We will get you scheduled for an upper endoscopy for further evaluation of your symptoms. I am going to start you on a different medication for acid reflux called pantoprazole,  you will take this 30-45 minutes prior to breakfast, please stay sitting upright 2-3 hours after eating and avoid foods that seem to trigger your symptoms. You can stop your famotidine. If you do not notice an improvement in acid reflux with this new medication please let me know.

## 2021-10-18 NOTE — Progress Notes (Signed)
Referring Provider: Georgann Housekeeper, NP Primary Care Physician:  Georgann Housekeeper, NP Primary GI Physician: new  Chief Complaint  Patient presents with   Follow-up    Patient here today for a Ed follow up from 09/29/2021 due to nausea and vomiting. She states she is still having bouts of nausea and vomiting with the vomiting being green in color. She has times of constipation, which she is not treating with medication. Patient is off balance and has speech problems, thinks due to starting gabapentin 800 mg QID. She is taking this due what they think is Shingles.    HPI:   Celestina Gironda is a 66 y.o. female with past medical history of CHF, HLD, HTN, Lupus.  Patient presenting today as a new patient for ED follow up. She is accompanied by her sister who helps provide history.  She presented to the ED on 12/17 for nausea and vomiting where she had a CT A/P that was unremarkable for any acute findings that would explain her symptoms, she was discharged with recomemndations to follow up with GI for further evaluation.  Patient's sister states that patient has had nausea and vomiting off and on for the past 6-8 months, she reports that sometimes food comes up and sometimes she has green emesis. She reports that n/v occurs anytime, even sometimes during the night. She reports that the smell of food will often cause her to be nauseous, spicy and fried foods especially make her nauseated. She sometimes has some lower abdominal pain but this is chronic as she has a history of IBS, diagnosed many years ago.  BMs are very irregular, she reports anywhere from 0-2 episodes per day with a mix of both diarrhea and constipation. She reports that she has noticed black stools since the nausea and vomiting began, though Melena occurs only occasionally. She denies any BRBPR. She is on Eliquis but takes no NSAIDs. She endorses that she has to eat small portions of food and has early satiety anytime she eats. She does  have promethazine and zofran at home that she takes as needed for her nausea. She has some occasional RUQ back pain that she noticed a few weeks ago, though she has had no upper abdominal pain. She denies any post prandial abdominal pain. She does endorse acid reflux 3-4x/week. She is currently on famotidine 20mg  daily for acid reflux, previously on omeprazole 20mg  daily but this was changed by another provider. She denies any issues with dysphagia or odynophagia.   She reports approx 80 pounds of weight loss in the past 1.5 years.  NSAID use:no NSAID use Social hx:no etoh or tobacco use Fam hx: no CRC, liver disease or other GI disorders  Last Colonoscopy: maybe 10 years ago, Dr. Britta Mccreedy Last Endoscopy:n/a  Past Medical History:  Diagnosis Date   CHF (congestive heart failure) ([place])    Headache    Hyperlipidemia    Hypertension    Lupus ([place])    Sicca ([place])     Past Surgical History:  Procedure Laterality Date   hystorectomy     SINUS SURGERY WITH INSTATRAK  2015    Current Outpatient Medications  Medication Sig Dispense Refill   albuterol (VENTOLIN HFA) 108 (90 Base) MCG/ACT inhaler Inhale 2 puffs into the lungs every 6 (six) hours as needed for wheezing or shortness of breath.     apixaban (ELIQUIS) 2.5 MG TABS tablet Take 5 mg by mouth 2 (two) times daily.     atorvastatin (LIPITOR)  10 MG tablet Take 10 mg by mouth daily.     cetirizine (ZYRTEC) 10 MG tablet Take 10 mg by mouth daily.     Cinnamon 500 MG capsule Take 500 mg by mouth daily.     diclofenac sodium (VOLTAREN) 1 % GEL 3 (three) times daily.     famotidine (PEPCID) 20 MG tablet Take 20 mg by mouth daily.     fenofibrate (TRICOR) 145 MG tablet Take 145 mg by mouth daily.     folic acid (FOLVITE) 1 MG tablet Take 1 mg by mouth daily.     furosemide (LASIX) 20 MG tablet TAKE (1) TABLET BY MOUTH ONCE DAILY AS NEEDED. (Patient taking differently: Take 20 mg by mouth daily as needed for fluid.) 30 tablet 0   gabapentin  (NEURONTIN) 800 MG tablet Take 800 mg by mouth 4 (four) times daily.     HYDROcodone-acetaminophen (NORCO/VICODIN) 5-325 MG tablet Take 1 tablet by mouth daily as needed.     JANUMET 50-1000 MG tablet Take 1 tablet by mouth 2 (two) times daily.     loperamide (IMODIUM A-D) 2 MG tablet Take 2 mg by mouth 2 (two) times daily.     loratadine (CLARITIN) 10 MG tablet Take 1 tablet by mouth daily.  2   montelukast (SINGULAIR) 10 MG tablet Take 1 tablet by mouth daily.  2   Multiple Vitamin (MULTIVITAMIN) tablet Take 1 tablet by mouth daily.     omeprazole (PRILOSEC) 20 MG capsule Take 20 mg by mouth daily.     ondansetron (ZOFRAN) 4 MG tablet Take 1-2 tablets (4-8 mg total) by mouth every 8 (eight) hours as needed for nausea or vomiting. 30 tablet 3   predniSONE (DELTASONE) 10 MG tablet Take 10 mg by mouth daily with breakfast.     promethazine (PHENERGAN) 25 MG tablet Take 25-50 mg by mouth every 8 (eight) hours as needed.     REFRESH PLUS 0.5 % SOLN      Turmeric 500 MG CAPS Take 1,000 mg by mouth daily at 6 (six) AM.     Vitamin D, Ergocalciferol, (DRISDOL) 1.25 MG (50000 UNIT) CAPS capsule Take 50,000 Units by mouth every 30 (thirty) days.     No current facility-administered medications for this visit.    Allergies as of 10/18/2021 - Review Complete 10/18/2021  Allergen Reaction Noted   Sulfa antibiotics Hives 06/13/2016   Caffeine Other (See Comments) 05/01/2021   Codeine Nausea And Vomiting 06/13/2016    Family History  Problem Relation Age of Onset   Hypertension Mother    Diabetes Mother    Kidney disease Mother    Heart disease Mother    High Cholesterol Mother    Lung cancer Mother    Hypertension Father    Diabetes Father    Kidney disease Father    Heart disease Father     Social History   Socioeconomic History   Marital status: Divorced    Spouse name: Not on file   Number of children: 1   Years of education: Not on file   Highest education level: Not on file   Occupational History   Not on file  Tobacco Use   Smoking status: Never   Smokeless tobacco: Never  Vaping Use   Vaping Use: Never used  Substance and Sexual Activity   Alcohol use: No   Drug use: No   Sexual activity: Yes    Partners: Male  Other Topics Concern   Not on file  Social History Narrative   Lives home alone.  Widow.    Is Disabled.  One child.  Sister, [name].   Social Determinants of Health   Financial Resource Strain: Not on file  Food Insecurity: Not on file  Transportation Needs: Not on file  Physical Activity: Not on file  Stress: Not on file  Social Connections: Not on file   Review of systems General: negative for malaise, night sweats, fever, chills, +weight loss Neck: Negative for lumps, goiter, pain and significant neck swelling Resp: Negative for cough, wheezing, dyspnea at rest CV: Negative for chest pain, leg swelling, palpitations, orthopnea GI: denies hematochezia, dysphagia, odyonophagia.  +constipation, +diarrhea, +early satiety, +melena, +acid reflux, +nausea, +vomiting MSK: Negative for joint pain or swelling, back pain, and muscle pain. Derm: Negative for itching or rash Psych: Denies depression, anxiety, memory loss, confusion. No homicidal or suicidal ideation.  Heme: Negative for prolonged bleeding, bruising easily, and swollen nodes. Endocrine: Negative for cold or heat intolerance, polyuria, polydipsia and goiter. Neuro: negative for tremor, gait imbalance, syncope and seizures. The remainder of the review of systems is noncontributory.  Physical Exam: BP 110/77 (BP Location: Left Arm, Patient Position: Sitting, Cuff Size: Large)    Pulse (!) 120    Temp 99.3 F (37.4 C) (Oral)    Ht 5\' 5"  (1.651 m)    Wt 163 lb 6.4 oz (74.1 kg)    BMI 27.19 kg/m  General:   Alert and oriented. No distress noted. Pleasant and cooperative.  Head:  Normocephalic and atraumatic. Eyes:  Conjuctiva clear without scleral icterus. Mouth:  Oral mucosa  pink and moist. Good dentition. No lesions. Heart: Normal rate and rhythm, s1 and s2 heart sounds present.  Lungs: Clear lung sounds in all lobes. Respirations equal and unlabored. Abdomen:  +BS, soft, non-tender and non-distended. No rebound or guarding. No HSM or masses noted. Derm: No palmar erythema or jaundice Msk:  Symmetrical without gross deformities. Normal posture. Extremities:  Without edema. Neurologic:  Alert and  oriented x4 Psych:  Alert and cooperative. Normal mood and affect.  Invalid input(s): 6 MONTHS   ASSESSMENT: Arden Axon is a 66 y.o. female presenting today for ongoing nausea and vomiting x6-8 months.  Nausea and vomiting for the past 6-8 months, with some ongoing acid reflux, early satiety, melena and weight loss. She denies NSAID use, no postprandial abdominal pain. Abdominal exam is relatively benign, negative murphy's sign and no epigastric or upper abdominal tenderness. Given her symptoms, I have concern for possible PUD, though H Pylori, gastritis or malignancy cannot be ruled out. We will proceed with EGD for further evaluation as well as add in PPI with once daily dosing for her frequent reflux symptoms.   Likely some aspect of IBS given chronic mix of diarrhea and constipation. She is without BRBPR.   Indications, risks and benefits of procedure discussed in detail with patient. Patient verbalized understanding and is in agreement to proceed with EGD at this time.   Patient is on eliquis which will need to be held appropriately prior to endoscopic procedure  PLAN:  Stop famotidine 2. Start pantoprazole 40mg  once daily 3. Schedule EGD 4. Reflux precautions  Follow Up: TBD after EGD  Rayli Wiederhold L. Alver Sorrow, MSN, APRN, AGNP-C Adult-Gerontology Nurse Practitioner [facility]

## 2021-10-18 NOTE — H&P (View-Only) (Signed)
Referring Provider: Georgann Housekeeper, NP Primary Care Physician:  Georgann Housekeeper, NP Primary GI Physician: new  Chief Complaint  Patient presents with   Follow-up    Patient here today for a Ed follow up from 09/29/2021 due to nausea and vomiting. She states she is still having bouts of nausea and vomiting with the vomiting being green in color. She has times of constipation, which she is not treating with medication. Patient is off balance and has speech problems, thinks due to starting gabapentin 800 mg QID. She is taking this due what they think is Shingles.    HPI:   Dinorah Masullo is a 66 y.o. female with past medical history of CHF, HLD, HTN, Lupus.  Patient presenting today as a new patient for ED follow up. She is accompanied by her sister who helps provide history.  She presented to the ED on 12/17 for nausea and vomiting where she had a CT A/P that was unremarkable for any acute findings that would explain her symptoms, she was discharged with recomemndations to follow up with GI for further evaluation.  Patient's sister states that patient has had nausea and vomiting off and on for the past 6-8 months, she reports that sometimes food comes up and sometimes she has green emesis. She reports that n/v occurs anytime, even sometimes during the night. She reports that the smell of food will often cause her to be nauseous, spicy and fried foods especially make her nauseated. She sometimes has some lower abdominal pain but this is chronic as she has a history of IBS, diagnosed many years ago.  BMs are very irregular, she reports anywhere from 0-2 episodes per day with a mix of both diarrhea and constipation. She reports that she has noticed black stools since the nausea and vomiting began, though Melena occurs only occasionally. She denies any BRBPR. She is on Eliquis but takes no NSAIDs. She endorses that she has to eat small portions of food and has early satiety anytime she eats. She does  have promethazine and zofran at home that she takes as needed for her nausea. She has some occasional RUQ back pain that she noticed a few weeks ago, though she has had no upper abdominal pain. She denies any post prandial abdominal pain. She does endorse acid reflux 3-4x/week. She is currently on famotidine 20mg  daily for acid reflux, previously on omeprazole 20mg  daily but this was changed by another provider. She denies any issues with dysphagia or odynophagia.   She reports approx 80 pounds of weight loss in the past 1.5 years.  NSAID use:no NSAID use Social hx:no etoh or tobacco use Fam hx: no CRC, liver disease or other GI disorders  Last Colonoscopy: maybe 10 years ago, Dr. Britta Mccreedy Last Endoscopy:n/a  Past Medical History:  Diagnosis Date   CHF (congestive heart failure) ([place])    Headache    Hyperlipidemia    Hypertension    Lupus ([place])    Sicca ([place])     Past Surgical History:  Procedure Laterality Date   hystorectomy     SINUS SURGERY WITH INSTATRAK  2015    Current Outpatient Medications  Medication Sig Dispense Refill   albuterol (VENTOLIN HFA) 108 (90 Base) MCG/ACT inhaler Inhale 2 puffs into the lungs every 6 (six) hours as needed for wheezing or shortness of breath.     apixaban (ELIQUIS) 2.5 MG TABS tablet Take 5 mg by mouth 2 (two) times daily.     atorvastatin (LIPITOR)  10 MG tablet Take 10 mg by mouth daily.     cetirizine (ZYRTEC) 10 MG tablet Take 10 mg by mouth daily.     Cinnamon 500 MG capsule Take 500 mg by mouth daily.     diclofenac sodium (VOLTAREN) 1 % GEL 3 (three) times daily.     famotidine (PEPCID) 20 MG tablet Take 20 mg by mouth daily.     fenofibrate (TRICOR) 145 MG tablet Take 145 mg by mouth daily.     folic acid (FOLVITE) 1 MG tablet Take 1 mg by mouth daily.     furosemide (LASIX) 20 MG tablet TAKE (1) TABLET BY MOUTH ONCE DAILY AS NEEDED. (Patient taking differently: Take 20 mg by mouth daily as needed for fluid.) 30 tablet 0   gabapentin  (NEURONTIN) 800 MG tablet Take 800 mg by mouth 4 (four) times daily.     HYDROcodone-acetaminophen (NORCO/VICODIN) 5-325 MG tablet Take 1 tablet by mouth daily as needed.     JANUMET 50-1000 MG tablet Take 1 tablet by mouth 2 (two) times daily.     loperamide (IMODIUM A-D) 2 MG tablet Take 2 mg by mouth 2 (two) times daily.     loratadine (CLARITIN) 10 MG tablet Take 1 tablet by mouth daily.  2   montelukast (SINGULAIR) 10 MG tablet Take 1 tablet by mouth daily.  2   Multiple Vitamin (MULTIVITAMIN) tablet Take 1 tablet by mouth daily.     omeprazole (PRILOSEC) 20 MG capsule Take 20 mg by mouth daily.     ondansetron (ZOFRAN) 4 MG tablet Take 1-2 tablets (4-8 mg total) by mouth every 8 (eight) hours as needed for nausea or vomiting. 30 tablet 3   predniSONE (DELTASONE) 10 MG tablet Take 10 mg by mouth daily with breakfast.     promethazine (PHENERGAN) 25 MG tablet Take 25-50 mg by mouth every 8 (eight) hours as needed.     REFRESH PLUS 0.5 % SOLN      Turmeric 500 MG CAPS Take 1,000 mg by mouth daily at 6 (six) AM.     Vitamin D, Ergocalciferol, (DRISDOL) 1.25 MG (50000 UNIT) CAPS capsule Take 50,000 Units by mouth every 30 (thirty) days.     No current facility-administered medications for this visit.    Allergies as of 10/18/2021 - Review Complete 10/18/2021  Allergen Reaction Noted   Sulfa antibiotics Hives 06/13/2016   Caffeine Other (See Comments) 05/01/2021   Codeine Nausea And Vomiting 06/13/2016    Family History  Problem Relation Age of Onset   Hypertension Mother    Diabetes Mother    Kidney disease Mother    Heart disease Mother    High Cholesterol Mother    Lung cancer Mother    Hypertension Father    Diabetes Father    Kidney disease Father    Heart disease Father     Social History   Socioeconomic History   Marital status: Divorced    Spouse name: Not on file   Number of children: 1   Years of education: Not on file   Highest education level: Not on file   Occupational History   Not on file  Tobacco Use   Smoking status: Never   Smokeless tobacco: Never  Vaping Use   Vaping Use: Never used  Substance and Sexual Activity   Alcohol use: No   Drug use: No   Sexual activity: Yes    Partners: Male  Other Topics Concern   Not on file  Social History Narrative   Lives home alone.  Widow.    Is Disabled.  One child.  Sister, [name].   Social Determinants of Health   Financial Resource Strain: Not on file  Food Insecurity: Not on file  Transportation Needs: Not on file  Physical Activity: Not on file  Stress: Not on file  Social Connections: Not on file   Review of systems General: negative for malaise, night sweats, fever, chills, +weight loss Neck: Negative for lumps, goiter, pain and significant neck swelling Resp: Negative for cough, wheezing, dyspnea at rest CV: Negative for chest pain, leg swelling, palpitations, orthopnea GI: denies hematochezia, dysphagia, odyonophagia.  +constipation, +diarrhea, +early satiety, +melena, +acid reflux, +nausea, +vomiting MSK: Negative for joint pain or swelling, back pain, and muscle pain. Derm: Negative for itching or rash Psych: Denies depression, anxiety, memory loss, confusion. No homicidal or suicidal ideation.  Heme: Negative for prolonged bleeding, bruising easily, and swollen nodes. Endocrine: Negative for cold or heat intolerance, polyuria, polydipsia and goiter. Neuro: negative for tremor, gait imbalance, syncope and seizures. The remainder of the review of systems is noncontributory.  Physical Exam: BP 110/77 (BP Location: Left Arm, Patient Position: Sitting, Cuff Size: Large)    Pulse (!) 120    Temp 99.3 F (37.4 C) (Oral)    Ht 5\' 5"  (1.651 m)    Wt 163 lb 6.4 oz (74.1 kg)    BMI 27.19 kg/m  General:   Alert and oriented. No distress noted. Pleasant and cooperative.  Head:  Normocephalic and atraumatic. Eyes:  Conjuctiva clear without scleral icterus. Mouth:  Oral mucosa  pink and moist. Good dentition. No lesions. Heart: Normal rate and rhythm, s1 and s2 heart sounds present.  Lungs: Clear lung sounds in all lobes. Respirations equal and unlabored. Abdomen:  +BS, soft, non-tender and non-distended. No rebound or guarding. No HSM or masses noted. Derm: No palmar erythema or jaundice Msk:  Symmetrical without gross deformities. Normal posture. Extremities:  Without edema. Neurologic:  Alert and  oriented x4 Psych:  Alert and cooperative. Normal mood and affect.  Invalid input(s): 6 MONTHS   ASSESSMENT: Gaylon Melchor is a 66 y.o. female presenting today for ongoing nausea and vomiting x6-8 months.  Nausea and vomiting for the past 6-8 months, with some ongoing acid reflux, early satiety, melena and weight loss. She denies NSAID use, no postprandial abdominal pain. Abdominal exam is relatively benign, negative murphy's sign and no epigastric or upper abdominal tenderness. Given her symptoms, I have concern for possible PUD, though H Pylori, gastritis or malignancy cannot be ruled out. We will proceed with EGD for further evaluation as well as add in PPI with once daily dosing for her frequent reflux symptoms.   Likely some aspect of IBS given chronic mix of diarrhea and constipation. She is without BRBPR.   Indications, risks and benefits of procedure discussed in detail with patient. Patient verbalized understanding and is in agreement to proceed with EGD at this time.   Patient is on eliquis which will need to be held appropriately prior to endoscopic procedure  PLAN:  Stop famotidine 2. Start pantoprazole 40mg  once daily 3. Schedule EGD 4. Reflux precautions  Follow Up: TBD after EGD  Cariann Kinnamon L. Alver Sorrow, MSN, APRN, AGNP-C Adult-Gerontology Nurse Practitioner [facility]

## 2021-10-21 DIAGNOSIS — K219 Gastro-esophageal reflux disease without esophagitis: Secondary | ICD-10-CM | POA: Insufficient documentation

## 2021-10-21 DIAGNOSIS — R1114 Bilious vomiting: Secondary | ICD-10-CM | POA: Insufficient documentation

## 2021-10-21 DIAGNOSIS — K921 Melena: Secondary | ICD-10-CM | POA: Insufficient documentation

## 2021-10-21 DIAGNOSIS — R634 Abnormal weight loss: Secondary | ICD-10-CM | POA: Insufficient documentation

## 2021-10-22 ENCOUNTER — Encounter (INDEPENDENT_AMBULATORY_CARE_PROVIDER_SITE_OTHER): Payer: Self-pay | Admitting: Gastroenterology

## 2021-10-22 ENCOUNTER — Encounter (INDEPENDENT_AMBULATORY_CARE_PROVIDER_SITE_OTHER): Payer: Self-pay

## 2021-10-22 ENCOUNTER — Other Ambulatory Visit (INDEPENDENT_AMBULATORY_CARE_PROVIDER_SITE_OTHER): Payer: Self-pay

## 2021-10-22 DIAGNOSIS — R1114 Bilious vomiting: Secondary | ICD-10-CM

## 2021-10-22 DIAGNOSIS — R6881 Early satiety: Secondary | ICD-10-CM

## 2021-10-22 DIAGNOSIS — K921 Melena: Secondary | ICD-10-CM

## 2021-10-22 NOTE — Progress Notes (Signed)
I reviewed recently obtained records of patient's most recent colonoscopy, date of exam was 05/26/15 done by Dr. Britta Mccreedy at [facility], with findings of mild diverticulosis in sigmoid colon, no evidence of UC or Crohn's disease, biopsies were taken from the Ascending colon and sigmoid colon to evaluate for evidence of microscopic colitis, biopsies were negative. No other specimens obtain. Recommendations were to repeat screening colonoscopy in 10 years.   We will hold off on proceeding with screening colonoscopy at time of EGD, as patient is up to date on this. Recommendations discussed with Dr. Jenetta Downer who is in agreement with this plan.   Levaeh Vice L. Alver Sorrow, MSN, APRN, AGNP-C Adult-Gerontology Nurse Practitioner [facility]

## 2021-10-26 ENCOUNTER — Other Ambulatory Visit: Payer: Self-pay

## 2021-10-26 ENCOUNTER — Encounter (INDEPENDENT_AMBULATORY_CARE_PROVIDER_SITE_OTHER): Payer: Self-pay

## 2021-10-26 ENCOUNTER — Ambulatory Visit (HOSPITAL_COMMUNITY)
Admission: RE | Admit: 2021-10-26 | Discharge: 2021-10-26 | Disposition: A | Discharge status: Home / Self Care | Payer: Medicare HMO | Attending: Gastroenterology | Admitting: Gastroenterology

## 2021-10-26 ENCOUNTER — Encounter (HOSPITAL_COMMUNITY): Payer: Self-pay | Admitting: Gastroenterology

## 2021-10-26 ENCOUNTER — Encounter (HOSPITAL_COMMUNITY)
Admission: RE | Disposition: A | Discharge status: Home / Self Care | Payer: Self-pay | Source: Home / Self Care | Attending: Gastroenterology

## 2021-10-26 ENCOUNTER — Ambulatory Visit (HOSPITAL_COMMUNITY): Payer: Medicare HMO | Admitting: Anesthesiology

## 2021-10-26 DIAGNOSIS — Z7901 Long term (current) use of anticoagulants: Secondary | ICD-10-CM | POA: Diagnosis not present

## 2021-10-26 DIAGNOSIS — K589 Irritable bowel syndrome without diarrhea: Secondary | ICD-10-CM | POA: Diagnosis not present

## 2021-10-26 DIAGNOSIS — Z6827 Body mass index (BMI) 27.0-27.9, adult: Secondary | ICD-10-CM | POA: Insufficient documentation

## 2021-10-26 DIAGNOSIS — Z79899 Other long term (current) drug therapy: Secondary | ICD-10-CM | POA: Diagnosis not present

## 2021-10-26 DIAGNOSIS — I110 Hypertensive heart disease with heart failure: Secondary | ICD-10-CM | POA: Diagnosis not present

## 2021-10-26 DIAGNOSIS — E785 Hyperlipidemia, unspecified: Secondary | ICD-10-CM | POA: Insufficient documentation

## 2021-10-26 DIAGNOSIS — M329 Systemic lupus erythematosus, unspecified: Secondary | ICD-10-CM | POA: Diagnosis not present

## 2021-10-26 DIAGNOSIS — K219 Gastro-esophageal reflux disease without esophagitis: Secondary | ICD-10-CM | POA: Diagnosis not present

## 2021-10-26 DIAGNOSIS — R112 Nausea with vomiting, unspecified: Secondary | ICD-10-CM | POA: Insufficient documentation

## 2021-10-26 DIAGNOSIS — R6881 Early satiety: Secondary | ICD-10-CM | POA: Insufficient documentation

## 2021-10-26 DIAGNOSIS — R1114 Bilious vomiting: Secondary | ICD-10-CM

## 2021-10-26 DIAGNOSIS — R519 Headache, unspecified: Secondary | ICD-10-CM | POA: Diagnosis not present

## 2021-10-26 DIAGNOSIS — R634 Abnormal weight loss: Secondary | ICD-10-CM | POA: Insufficient documentation

## 2021-10-26 DIAGNOSIS — K317 Polyp of stomach and duodenum: Secondary | ICD-10-CM | POA: Diagnosis not present

## 2021-10-26 DIAGNOSIS — I509 Heart failure, unspecified: Secondary | ICD-10-CM | POA: Insufficient documentation

## 2021-10-26 HISTORY — PX: POLYPECTOMY: SHX5525

## 2021-10-26 HISTORY — PX: HOT HEMOSTASIS: SHX5433

## 2021-10-26 HISTORY — PX: HEMOSTASIS CLIP PLACEMENT: SHX6857

## 2021-10-26 HISTORY — PX: ESOPHAGOGASTRODUODENOSCOPY (EGD) WITH PROPOFOL: SHX5813

## 2021-10-26 HISTORY — PX: BIOPSY: SHX5522

## 2021-10-26 LAB — GLUCOSE, CAPILLARY: Glucose-Capillary: 91 mg/dL (ref 70–99)

## 2021-10-26 SURGERY — ESOPHAGOGASTRODUODENOSCOPY (EGD) WITH PROPOFOL
Anesthesia: General

## 2021-10-26 MED ORDER — PROPOFOL 500 MG/50ML IV EMUL
INTRAVENOUS | Status: DC | PRN
Start: 2021-10-26 — End: 2021-10-26
  Administered 2021-10-26: 150 ug/kg/min via INTRAVENOUS

## 2021-10-26 MED ORDER — PROPOFOL 10 MG/ML IV BOLUS
INTRAVENOUS | Status: DC | PRN
  Administered 2021-10-26: 50 mg via INTRAVENOUS

## 2021-10-26 MED ORDER — LACTATED RINGERS IV SOLN: INTRAVENOUS | Status: DC | PRN

## 2021-10-26 MED ORDER — LACTATED RINGERS IV SOLN: INTRAVENOUS | Status: DC

## 2021-10-26 NOTE — Anesthesia Postprocedure Evaluation (Signed)
Anesthesia Post Note  Patient: Joy Leach  Procedure(s) Performed: ESOPHAGOGASTRODUODENOSCOPY (EGD) WITH PROPOFOL POLYPECTOMY BIOPSY HEMOSTASIS CLIP PLACEMENT HOT HEMOSTASIS (ARGON PLASMA COAGULATION/BICAP)  Patient location during evaluation: Phase II Anesthesia Type: General Level of consciousness: awake Pain management: pain level controlled Vital Signs Assessment: post-procedure vital signs reviewed and stable Respiratory status: spontaneous breathing and respiratory function stable Cardiovascular status: blood pressure returned to baseline and stable Postop Assessment: no headache and no apparent nausea or vomiting Anesthetic complications: no Comments: Late entry   No notable events documented.   Last Vitals:  Vitals:   10/26/21 0914 10/26/21 1105  BP: 127/63 102/61  Pulse: 73 81  Resp: 20 20  Temp: 36.8 C (!) 36.4 C  SpO2: 98% 96%    Last Pain:  Vitals:   10/26/21 1105  TempSrc: Oral  PainSc: 0-No pain                 Louann Sjogren

## 2021-10-26 NOTE — Discharge Instructions (Signed)
You are being discharged to home.  Resume your previous diet.  We are waiting for your pathology results.  

## 2021-10-26 NOTE — Transfer of Care (Signed)
Immediate Anesthesia Transfer of Care Note  Patient: [name]  Procedure(s) Performed: ESOPHAGOGASTRODUODENOSCOPY (EGD) WITH PROPOFOL POLYPECTOMY BIOPSY HEMOSTASIS CLIP PLACEMENT HOT HEMOSTASIS (ARGON PLASMA COAGULATION/BICAP)  Patient Location: PACU  Anesthesia Type:General  Level of Consciousness: awake, alert , oriented and patient cooperative  Airway & Oxygen Therapy: Patient Spontanous Breathing  Post-op Assessment: Report given to RN, Post -op Vital signs reviewed and stable and Patient moving all extremities X 4  Post vital signs: Reviewed and stable  Last Vitals:  Vitals Value Taken Time  BP    Temp    Pulse    Resp    SpO2      Last Pain:  Vitals:   10/26/21 1043  TempSrc:   PainSc: 8       Patients Stated Pain Goal: 8 (97/94/80 1655)  Complications: No notable events documented.

## 2021-10-26 NOTE — Anesthesia Preprocedure Evaluation (Signed)
Anesthesia Evaluation  Patient identified by MRN, date of birth, ID band Patient awake    Reviewed: Allergy & Precautions, H&P , NPO status , Patient's Chart, lab work & pertinent test results, reviewed documented beta blocker date and time   Airway Mallampati: II  TM Distance: >3 FB Neck ROM: full    Dental no notable dental hx.    Pulmonary neg pulmonary ROS,    Pulmonary exam normal breath sounds clear to auscultation       Cardiovascular Exercise Tolerance: Good hypertension, +CHF   Rhythm:regular Rate:Normal     Neuro/Psych  Headaches, negative psych ROS   GI/Hepatic Neg liver ROS, GERD  Medicated,  Endo/Other  negative endocrine ROS  Renal/GU negative Renal ROS  negative genitourinary   Musculoskeletal   Abdominal   Peds  Hematology negative hematology ROS (+)   Anesthesia Other Findings   Reproductive/Obstetrics negative OB ROS                             Anesthesia Physical Anesthesia Plan  ASA: 3  Anesthesia Plan: General   Post-op Pain Management:    Induction:   PONV Risk Score and Plan: Propofol infusion  Airway Management Planned:   Additional Equipment:   Intra-op Plan:   Post-operative Plan:   Informed Consent: I have reviewed the patients History and Physical, chart, labs and discussed the procedure including the risks, benefits and alternatives for the proposed anesthesia with the patient or authorized representative who has indicated his/her understanding and acceptance.     Dental Advisory Given  Plan Discussed with: CRNA  Anesthesia Plan Comments:         Anesthesia Quick Evaluation

## 2021-10-26 NOTE — Interval H&P Note (Signed)
History and Physical Interval Note:  10/26/2021 9:37 AM  Joy Leach  has presented today for surgery, with the diagnosis of Nausea Vomiting Early Satiety.  The various methods of treatment have been discussed with the patient and family. After consideration of risks, benefits and other options for treatment, the patient has consented to  Procedure(s) with comments: ESOPHAGOGASTRODUODENOSCOPY (EGD) WITH PROPOFOL (N/A) - 1045 as a surgical intervention.  The patient's history has been reviewed, patient examined, no change in status, stable for surgery.  I have reviewed the patient's chart and labs.  Questions were answered to the patient's satisfaction.     Maylon Peppers [place]

## 2021-10-26 NOTE — Op Note (Addendum)
[facility] Patient Name: Joy Leach Procedure Date: 10/26/2021 10:30 AM MRN: [id number] Date of Birth: 07/10/1956 Attending MD: Maylon Peppers ,  CSN: [id number] Age: 66 Admit Type: Outpatient Procedure:                Upper GI endoscopy Indications:              Nausea with vomiting Providers:                Maylon Peppers, Lambert Mody, Aram Candela Referring MD:              Medicines:                Monitored Anesthesia Care Complications:            No immediate complications. Estimated Blood Loss:     Estimated blood loss: none. Procedure:                Pre-Anesthesia Assessment:                           - Prior to the procedure, a History and Physical                            was performed, and patient medications, allergies                            and sensitivities were reviewed. The patient's                            tolerance of previous anesthesia was reviewed.                           - The risks and benefits of the procedure and the                            sedation options and risks were discussed with the                            patient. All questions were answered and informed                            consent was obtained.                           - ASA Grade Assessment: II - A patient with mild                            systemic disease.                           After obtaining informed consent, the endoscope was                            passed under direct vision. Throughout the                            procedure, the patient's blood pressure, pulse,  and                            oxygen saturations were monitored continuously. The                            GIF-H190 ([id number]) scope was introduced through the                            mouth, and advanced to the second part of duodenum.                            The upper GI endoscopy was accomplished without                            difficulty. The patient tolerated the  procedure                            well. Scope In: 10:47:09 AM Scope Out: 11:00:21 AM Total Procedure Duration: 0 hours 13 minutes 12 seconds  Findings:      The examined esophagus was normal.      Multiple 4 to 8 mm pedunculated and sessile polyps with no bleeding and       no stigmata of recent bleeding were found in the gastric fundus and in       the gastric body. The largest polyp (8 mm) had an erythematous surface,       the polyp was removed with a hot snare. Resection and retrieval were       complete via Jabier Mutton net. To prevent bleeding after the polypectomy, two       hemostatic clips were successfully placed. There was no bleeding at the       end of the procedure. The rest of the polyps had a fundic gland polyp       appearance.      The rest of the examined stomach was normal. Biopsies were taken with a       cold forceps for Helicobacter pylori testing.      The examined duodenum was normal. Biopsies were taken with a cold       forceps for histology. Impression:               - Normal esophagus.                           - Multiple gastric polyps. Resected x1 and                            retrieved. Clips were placed.                           - rest of the examined stomach was normal. Biopsied.                           - Normal duodenun. Biopsied. Moderate Sedation:      Per Anesthesia Care Recommendation:           - Discharge patient to home (ambulatory).                           -  Resume previous diet.                           - Await pathology results.                           - Continue Zofran as needed for nausea. Procedure Code(s):        --- Professional ---                           [phone], Esophagogastroduodenoscopy, flexible,                            transoral; with removal of tumor(s), polyp(s), or                            other lesion(s) by snare technique                           43239, 32, Esophagogastroduodenoscopy, flexible,                             transoral; with biopsy, single or multiple Diagnosis Code(s):        --- Professional ---                           K31.7, Polyp of stomach and duodenum                           R11.2, Nausea with vomiting, unspecified CPT copyright 2019 American Medical Association. All rights reserved. The codes documented in this report are preliminary and upon coder review may  be revised to meet current compliance requirements. Maylon Peppers, MD Maylon Peppers,  10/26/2021 11:09:55 AM This report has been signed electronically. Number of Addenda: 0

## 2021-10-30 ENCOUNTER — Ambulatory Visit: Payer: Medicare HMO | Admitting: Diagnostic Neuroimaging

## 2021-10-30 LAB — SURGICAL PATHOLOGY

## 2021-10-31 ENCOUNTER — Encounter (HOSPITAL_COMMUNITY): Payer: Self-pay | Admitting: Gastroenterology

## 2021-10-31 ENCOUNTER — Telehealth (INDEPENDENT_AMBULATORY_CARE_PROVIDER_SITE_OTHER): Payer: Self-pay

## 2021-10-31 NOTE — Telephone Encounter (Signed)
Per Lorenza Evangelist, NP its ok to stop patients Eliquis 2 days prior to procedure patient should return to her daily dose the following day after procedure

## 2021-10-31 NOTE — Telephone Encounter (Signed)
Hi Darius Bump,   This is for what procedure? We did an EGD last week  Thanks,  Maylon Peppers, MD Gastroenterology and Hepatology [facility]

## 2022-02-19 ENCOUNTER — Other Ambulatory Visit (INDEPENDENT_AMBULATORY_CARE_PROVIDER_SITE_OTHER): Payer: Self-pay | Admitting: Gastroenterology

## 2022-02-21 ENCOUNTER — Encounter (INDEPENDENT_AMBULATORY_CARE_PROVIDER_SITE_OTHER): Payer: Self-pay

## 2022-02-21 ENCOUNTER — Ambulatory Visit (INDEPENDENT_AMBULATORY_CARE_PROVIDER_SITE_OTHER): Payer: Medicare HMO | Admitting: Gastroenterology

## 2022-02-21 ENCOUNTER — Encounter (INDEPENDENT_AMBULATORY_CARE_PROVIDER_SITE_OTHER): Payer: Self-pay | Admitting: Gastroenterology

## 2022-02-21 ENCOUNTER — Other Ambulatory Visit (INDEPENDENT_AMBULATORY_CARE_PROVIDER_SITE_OTHER): Payer: Self-pay

## 2022-02-21 ENCOUNTER — Telehealth (INDEPENDENT_AMBULATORY_CARE_PROVIDER_SITE_OTHER): Payer: Self-pay

## 2022-02-21 VITALS — BP 84/55 | HR 86 | Temp 97.9°F | Ht 65.0 in | Wt 150.3 lb

## 2022-02-21 DIAGNOSIS — R1011 Right upper quadrant pain: Secondary | ICD-10-CM | POA: Insufficient documentation

## 2022-02-21 DIAGNOSIS — R110 Nausea: Secondary | ICD-10-CM | POA: Insufficient documentation

## 2022-02-21 DIAGNOSIS — R634 Abnormal weight loss: Secondary | ICD-10-CM

## 2022-02-21 DIAGNOSIS — R197 Diarrhea, unspecified: Secondary | ICD-10-CM | POA: Insufficient documentation

## 2022-02-21 DIAGNOSIS — K625 Hemorrhage of anus and rectum: Secondary | ICD-10-CM

## 2022-02-21 MED ORDER — PEG 3350-KCL-NA BICARB-NACL 420 G PO SOLR: 4000.0000 mL | ORAL | 0 refills | Status: DC

## 2022-02-21 NOTE — Telephone Encounter (Signed)
Joy Leach, CMA  ?

## 2022-02-21 NOTE — Progress Notes (Addendum)
? ?Referring Provider: Georgann Housekeeper, NP ?Primary Care Physician:  Georgann Housekeeper, NP ?Primary GI Physician:  ? ?Chief Complaint  ?Joy Leach presents with  ? Blood In Stools  ?  Joy Leach arrives with sister Joy Leach. Was seen at gyn and told she had some blood in stool. Joy Leach states she does have a hemhorroid  Has diarrhea and nausea all the time. Started a couple months ago. Was at [facility] in Jan. Diagnosised with mycobacterium disease and taking three antibiotics for the next 6 months to one year.   ? ?HPI:   ?Joy Leach is a 66 y.o. female with past medical history of CHF, HLD, HTN, Lupus. ? ?Joy Leach presenting today for blood in stools, diarrhea and nausea. ? ?Last seen 10/18/21. At that time she had had previous ED visit on 12/17 for nausea and vomiting with CT A/P performed that was unremarkable. Reported that Joy Leach has had nause and vomiting off and on for the past 6-8 months, sometimes with food regurgitation and other times with green colored emesis. Endorsed chronic lower abdominal pain from hx of IBS. BMs irregular with 0-2 episodes of stools per day with both diarrhea and constipation. Reported occasional melena, She denies any BRBPR. reported early satiety and acid reflux 3-4x/week. She report approx 80 pounds of weight loss in the past 1.5 years. She was scheduled for EGD and started on protonix '40mg'$  daily.  ? 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 She has lost 13 pounds since January. ? ?Today she states that she started ozempic last week and began having worsening diarrhea for about 24 hours at a time after. States that she had trouble even keeping water in as she would have diarrhea shortly after. Notably, she has had intermittent diarrhea for some time, though feels it worsened on ozempic. She is having 2-3 BMs per day, stools are mostly loose if not watery. States that her GYN doctor did a stool  test during her recent exam and was told that there was presence of blood. She states that appetite is not great, she has to make herself eat. Has ongoing nausea which she is taking zofran for. Has to avoid any kind of spices on food as well as greasy/heavy foods because these increase her nausea. States that she is no longer having any GERD symptoms, feels that protonix Is keeping her symptoms well managed. She has noticed some blood in her stools, both when she wipes and mixed in with her stools at times. She states that she only notices this on occasion. She does endorse some darker stools at times as well. She is maintained on Eliquis. Denies any post prandial abdominal pain. She is taking imodium BID without much relief.  ? ?BP today is 84/55, states she was taken off of her BP pills a while ago, maybe 1 year. Her PCP is keeping an eye on BP. Pt does endorse some dizziness and lightheadedness recently. Is seeing her PCP on Monday for follow up of this.  ? ?She is using voltaren gel on her knees a few times per week. States she takes norco very rarely and recently ran out so Is not currently using this. ? ?Last Colonoscopy:05/26/15 Dr. Britta Mccreedy, [facility] with mild diverticulosis of sigmoid colon, biopsies taken from ascending and sigmoid colon were negative. ? ?Last Endoscopy:10/26/21 normal esophagus  ?- Multiple gastric polyps. Fundic gland polyps ?- rest of the examined stomach was normal. Biopsied-normal ?-  Normal duodenum. Biopsied-normal ? ?Past Medical History:  ?Diagnosis Date  ? CHF (congestive heart failure) ([place])   ? Headache   ? Hyperlipidemia   ? Hypertension   ? Lupus ([place])   ? Sicca ([place])   ? ? ?Past Surgical History:  ?Procedure Laterality Date  ? BIOPSY  10/26/2021  ? Procedure: BIOPSY;  Surgeon: Harvel Quale, MD;  Location: [facility];  Service: Gastroenterology;;  ? COLONOSCOPY  05/26/2015  ? Dr Britta Mccreedy, mild diverticulosis in sigmoid colon, biosies taken from [facility] and sigmoid  colon-negative  ? ESOPHAGOGASTRODUODENOSCOPY (EGD) WITH PROPOFOL N/A 10/26/2021  ? Procedure: ESOPHAGOGASTRODUODENOSCOPY (EGD) WITH PROPOFOL;  Surgeon: Harvel Quale, MD;  Location: [facility];  Service: Gastroenterology;  Laterality: N/A;  1045  ? HEMOSTASIS CLIP PLACEMENT  10/26/2021  ? Procedure: HEMOSTASIS CLIP PLACEMENT;  Surgeon: Harvel Quale, MD;  Location: [facility];  Service: Gastroenterology;;  ? HOT HEMOSTASIS  10/26/2021  ? Procedure: HOT HEMOSTASIS (ARGON PLASMA COAGULATION/BICAP);  Surgeon: Montez Morita, Quillian Quince, MD;  Location: [facility];  Service: Gastroenterology;;  ? hystorectomy    ? POLYPECTOMY  10/26/2021  ? Procedure: POLYPECTOMY;  Surgeon: Harvel Quale, MD;  Location: [facility];  Service: Gastroenterology;;  ? SINUS SURGERY WITH INSTATRAK  2015  ? ? ?Current Outpatient Medications  ?Medication Sig Dispense Refill  ? albuterol (VENTOLIN HFA) 108 (90 Base) MCG/ACT inhaler Inhale 2 puffs into the lungs every 6 (six) hours as needed for wheezing or shortness of breath.    ? apixaban (ELIQUIS) 2.5 MG TABS tablet Take 2.5 mg by mouth 2 (two) times daily.    ? azithromycin (ZITHROMAX) 250 MG tablet Take 250 mg by mouth daily.    ? diclofenac sodium (VOLTAREN) 1 % GEL Apply 1 application topically 3 (three) times daily as needed (knee pain.).    ? DULoxetine (CYMBALTA) 30 MG capsule Take by mouth daily.    ? fenofibrate (TRICOR) 145 MG tablet Take 145 mg by mouth every evening.    ? furosemide (LASIX) 20 MG tablet TAKE (1) TABLET BY MOUTH ONCE DAILY AS NEEDED. (Joy Leach taking differently: Take 20 mg by mouth daily as needed for fluid.) 30 tablet 0  ? HYDROcodone-acetaminophen (NORCO/VICODIN) 5-325 MG tablet Take 1 tablet by mouth daily as needed (pain.).    ? JANUMET 50-1000 MG tablet Take 1 tablet by mouth 2 (two) times daily.    ? loperamide (IMODIUM A-D) 2 MG tablet Take 2 mg by mouth 2 (two) times daily.    ? montelukast (SINGULAIR) 10 MG tablet  Take 10 mg by mouth at bedtime.  2  ? moxifloxacin (AVELOX) 400 MG tablet Take 400 mg by mouth daily.    ? Multiple Vitamin (MULTIVITAMIN) tablet Take 1 tablet by mouth in the morning.    ? ondansetron (ZOFRAN) 4 MG tablet Take 1-2 tablets (4-8 mg total) by mouth every 8 (eight) hours as needed for nausea or vomiting. 30 tablet 3  ? OZEMPIC, 1 MG/DOSE, 4 MG/3ML SOPN Inject into the skin. Once weekly    ? pantoprazole (PROTONIX) 40 MG tablet TAKE 1 TABLET BY MOUTH ONCE DAILY. 90 tablet 1  ? predniSONE (DELTASONE) 10 MG tablet Take 10 mg by mouth daily with breakfast.    ? pregabalin (LYRICA) 75 MG capsule Take 75 mg by mouth 3 (three) times daily.    ? promethazine (PHENERGAN) 25 MG tablet Take 25-50 mg by mouth every 8 (eight) hours as needed for nausea or vomiting.    ?  rifabutin (MYCOBUTIN) 150 MG capsule Take 300 mg by mouth daily.    ? Turmeric 500 MG CAPS Take 1,000 mg by mouth every evening.    ? Vitamin D, Ergocalciferol, (DRISDOL) 1.25 MG (50000 UNIT) CAPS capsule Take 50,000 Units by mouth every 30 (thirty) days.    ? ?No current facility-administered medications for this visit.  ? ? ?Allergies as of 02/21/2022 - Review Complete 02/21/2022  ?Allergen Reaction Noted  ? Sulfa antibiotics Hives 06/13/2016  ? Caffeine Other (See Comments) 05/01/2021  ? Codeine Nausea And Vomiting 06/13/2016  ? ? ?Family History  ?Problem Relation Age of Onset  ? Hypertension Mother   ? Diabetes Mother   ? Kidney disease Mother   ? Heart disease Mother   ? High Cholesterol Mother   ? Lung cancer Mother   ? Hypertension Father   ? Diabetes Father   ? Kidney disease Father   ? Heart disease Father   ? ? ?Social History  ? ?Socioeconomic History  ? Marital status: Divorced  ?  Spouse name: Not on file  ? Number of children: 1  ? Years of education: Not on file  ? Highest education level: Not on file  ?Occupational History  ? Not on file  ?Tobacco Use  ? Smoking status: Never  ?  Passive exposure: Never  ? Smokeless tobacco: Never   ?Vaping Use  ? Vaping Use: Never used  ?Substance and Sexual Activity  ? Alcohol use: No  ? Drug use: No  ? Sexual activity: Yes  ?  Partners: Male  ?Other Topics Concern  ? Not on file  ?Social History Narrative

## 2022-02-21 NOTE — Patient Instructions (Signed)
Please continue on your protonix '40mg'$  once daily ?I am ordering an [place] of your gallbladder as you are having continued nausea and some tenderness in your Right upper abdomen ?We will get you scheduled for a colonoscopy for further evaluation of blood in stools as well as the weight loss ?In the meantime, you can try doing 2-3 boost or ensure protein shakes per day, these should be done between meals to help get some added nutrients and avoid further weight loss ?Please make sure you discuss ongoing low blood pressure with your primary care doctor ?Continue taking your probiotic while you are on the antibiotics for you mycobacterium ?Be mindful that ozempic can cause diarrhea as well so you made need to discuss this with your PCP if you continue to have worsening diarrhea while taking this ? ?Follow up 4 months ?

## 2022-02-22 ENCOUNTER — Encounter (INDEPENDENT_AMBULATORY_CARE_PROVIDER_SITE_OTHER): Payer: Self-pay

## 2022-02-25 ENCOUNTER — Telehealth (INDEPENDENT_AMBULATORY_CARE_PROVIDER_SITE_OTHER): Payer: Self-pay

## 2022-02-25 NOTE — Telephone Encounter (Signed)
Ninetta Lights, NP states that Joy Leach 2056/04/29 may stop Eliquis 2 days prior to procedure and may resume medication 24 hrs after procedure. ?

## 2022-03-04 ENCOUNTER — Ambulatory Visit (HOSPITAL_COMMUNITY)
Admission: RE | Admit: 2022-03-04 | Discharge: 2022-03-04 | Disposition: A | Discharge status: Home / Self Care | Payer: Medicare HMO | Source: Ambulatory Visit | Attending: Gastroenterology | Admitting: Gastroenterology

## 2022-03-04 DIAGNOSIS — R1011 Right upper quadrant pain: Secondary | ICD-10-CM | POA: Diagnosis present

## 2022-03-04 DIAGNOSIS — R110 Nausea: Secondary | ICD-10-CM | POA: Insufficient documentation

## 2022-03-27 NOTE — Patient Instructions (Signed)
Joy Leach  03/27/2022     '@PREFPERIOPPHARMACY'$ @   Your procedure is scheduled on  04/02/2022.   Report to [facility] at  [phone]  A.M.   Call this number if you have problems the morning of surgery:  [phone]   Remember:  Follow the diet and prep instructions given to you by the office.    Your last dose of eliquis should be on 03/30/2022.      DO NOT take any medications for diabetes the morning of your procedure.      Use your inhaler before you come and bring your rescue inhaler with you.     DO NOT smoke tobacco or vape for 24 hours before your procedure.    Take these medicines the morning of surgery with A SIP OF WATER      zyrtec, cymbalta, hydrocodone(If needed), prilosec, zofran (If needed), protonix, prednisone, lyrica.     Do not wear jewelry, make-up or nail polish.  Do not wear lotions, powders, or perfumes, or deodorant.  Do not shave 48 hours prior to surgery.  Men may shave face and neck.  Do not bring valuables to the hospital.  [facility] is not responsible for any belongings or valuables.  Contacts, dentures or bridgework may not be worn into surgery.  Leave your suitcase in the car.  After surgery it may be brought to your room.  For patients admitted to the hospital, discharge time will be determined by your treatment team.  Patients discharged the day of surgery will not be allowed to drive home and must have someone with them for 24 hours.    Special instructions:   DO NOT smoke tobacco or vape for 24 hours before your procedure.  Please read over the following fact sheets that you were given. Anesthesia Post-op Instructions and Care and Recovery After Surgery      Colonoscopy, Adult, Care After The following information offers guidance on how to care for yourself after your procedure. Your health care provider may also give you more specific instructions. If you have problems or questions, contact your health care provider. What  can I expect after the procedure? After the procedure, it is common to have: A small amount of blood in your stool for 24 hours after the procedure. Some gas. Mild cramping or bloating of your abdomen. Follow these instructions at home: Eating and drinking  Drink enough fluid to keep your urine pale yellow. Follow instructions from your health care provider about eating or drinking restrictions. Resume your normal diet as told by your health care provider. Avoid heavy or fried foods that are hard to digest. Activity Rest as told by your health care provider. Avoid sitting for a long time without moving. Get up to take short walks every 1-2 hours. This is important to improve blood flow and breathing. Ask for help if you feel weak or unsteady. Return to your normal activities as told by your health care provider. Ask your health care provider what activities are safe for you. Managing cramping and bloating  Try walking around when you have cramps or feel bloated. If directed, apply heat to your abdomen as told by your health care provider. Use the heat source that your health care provider recommends, such as a moist heat pack or a heating pad. Place a towel between your skin and the heat source. Leave the heat on for 20-30 minutes. Remove the heat if your skin turns bright red.  This is especially important if you are unable to feel pain, heat, or cold. You have a greater risk of getting burned. General instructions If you were given a sedative during the procedure, it can affect you for several hours. Do not drive or operate machinery until your health care provider says that it is safe. For the first 24 hours after the procedure: Do not sign important documents. Do not drink alcohol. Do your regular daily activities at a slower pace than normal. Eat soft foods that are easy to digest. Take over-the-counter and prescription medicines only as told by your health care provider. Keep all  follow-up visits. This is important. Contact a health care provider if: You have blood in your stool 2-3 days after the procedure. Get help right away if: You have more than a small spotting of blood in your stool. You have large blood clots in your stool. You have swelling of your abdomen. You have nausea or vomiting. You have a fever. You have increasing pain in your abdomen that is not relieved with medicine. These symptoms may be an emergency. Get help right away. Call 911. Do not wait to see if the symptoms will go away. Do not drive yourself to the hospital. Summary After the procedure, it is common to have a small amount of blood in your stool. You may also have mild cramping and bloating of your abdomen. If you were given a sedative during the procedure, it can affect you for several hours. Do not drive or operate machinery until your health care provider says that it is safe. Get help right away if you have a lot of blood in your stool, nausea or vomiting, a fever, or increased pain in your abdomen. This information is not intended to replace advice given to you by your health care provider. Make sure you discuss any questions you have with your health care provider. Document Revised: 05/23/2021 Document Reviewed: 05/23/2021 Elsevier Patient Education  [place] After This sheet gives you information about how to care for yourself after your procedure. Your health care provider may also give you more specific instructions. If you have problems or questions, contact your health care provider. What can I expect after the procedure? After the procedure, it is common to have: Tiredness. Forgetfulness about what happened after the procedure. Impaired judgment for important decisions. Nausea or vomiting. Some difficulty with balance. Follow these instructions at home: For the time period you were told by your health care provider:      Rest as needed. Do not participate in activities where you could fall or become injured. Do not drive or use machinery. Do not drink alcohol. Do not take sleeping pills or medicines that cause drowsiness. Do not make important decisions or sign legal documents. Do not take care of children on your own. Eating and drinking Follow the diet that is recommended by your health care provider. Drink enough fluid to keep your urine pale yellow. If you vomit: Drink water, juice, or soup when you can drink without vomiting. Make sure you have little or no nausea before eating solid foods. General instructions Have a responsible adult stay with you for the time you are told. It is important to have someone help care for you until you are awake and alert. Take over-the-counter and prescription medicines only as told by your health care provider. If you have sleep apnea, surgery and certain medicines can increase your risk for breathing problems.  Follow instructions from your health care provider about wearing your sleep device: Anytime you are sleeping, including during daytime naps. While taking prescription pain medicines, sleeping medicines, or medicines that make you drowsy. Avoid smoking. Keep all follow-up visits as told by your health care provider. This is important. Contact a health care provider if: You keep feeling nauseous or you keep vomiting. You feel light-headed. You are still sleepy or having trouble with balance after 24 hours. You develop a rash. You have a fever. You have redness or swelling around the IV site. Get help right away if: You have trouble breathing. You have new-onset confusion at home. Summary For several hours after your procedure, you may feel tired. You may also be forgetful and have poor judgment. Have a responsible adult stay with you for the time you are told. It is important to have someone help care for you until you are awake and alert. Rest as  told. Do not drive or operate machinery. Do not drink alcohol or take sleeping pills. Get help right away if you have trouble breathing, or if you suddenly become confused. This information is not intended to replace advice given to you by your health care provider. Make sure you discuss any questions you have with your health care provider. Document Revised: 09/04/2021 Document Reviewed: 09/02/2019 Elsevier Patient Education  [place].

## 2022-03-29 ENCOUNTER — Encounter (HOSPITAL_COMMUNITY): Payer: Self-pay

## 2022-03-29 ENCOUNTER — Encounter (HOSPITAL_COMMUNITY)
Admission: RE | Admit: 2022-03-29 | Discharge: 2022-03-29 | Disposition: A | Discharge status: Home / Self Care | Payer: Medicare HMO | Source: Ambulatory Visit | Attending: Gastroenterology | Admitting: Gastroenterology

## 2022-03-29 HISTORY — DX: Type 2 diabetes mellitus without complications: E11.9

## 2022-03-29 HISTORY — DX: Chronic kidney disease, unspecified: N18.9

## 2022-03-29 HISTORY — DX: Mycobacterial infection, unspecified: A31.9

## 2022-04-02 ENCOUNTER — Encounter (HOSPITAL_COMMUNITY)
Admission: RE | Disposition: A | Discharge status: Home / Self Care | Payer: Self-pay | Source: Ambulatory Visit | Attending: Gastroenterology

## 2022-04-02 ENCOUNTER — Ambulatory Visit (HOSPITAL_COMMUNITY): Payer: Medicare HMO | Admitting: Anesthesiology

## 2022-04-02 ENCOUNTER — Ambulatory Visit (HOSPITAL_BASED_OUTPATIENT_CLINIC_OR_DEPARTMENT_OTHER): Payer: Medicare HMO | Admitting: Anesthesiology

## 2022-04-02 ENCOUNTER — Encounter (HOSPITAL_COMMUNITY): Payer: Self-pay | Admitting: Gastroenterology

## 2022-04-02 ENCOUNTER — Ambulatory Visit (HOSPITAL_COMMUNITY)
Admission: RE | Admit: 2022-04-02 | Discharge: 2022-04-02 | Disposition: A | Discharge status: Home / Self Care | Payer: Medicare HMO | Source: Ambulatory Visit | Attending: Gastroenterology | Admitting: Gastroenterology

## 2022-04-02 DIAGNOSIS — K5731 Diverticulosis of large intestine without perforation or abscess with bleeding: Secondary | ICD-10-CM

## 2022-04-02 DIAGNOSIS — K5730 Diverticulosis of large intestine without perforation or abscess without bleeding: Secondary | ICD-10-CM | POA: Diagnosis not present

## 2022-04-02 DIAGNOSIS — N189 Chronic kidney disease, unspecified: Secondary | ICD-10-CM | POA: Insufficient documentation

## 2022-04-02 DIAGNOSIS — K625 Hemorrhage of anus and rectum: Secondary | ICD-10-CM

## 2022-04-02 DIAGNOSIS — K219 Gastro-esophageal reflux disease without esophagitis: Secondary | ICD-10-CM | POA: Insufficient documentation

## 2022-04-02 DIAGNOSIS — R1011 Right upper quadrant pain: Secondary | ICD-10-CM

## 2022-04-02 DIAGNOSIS — Z86718 Personal history of other venous thrombosis and embolism: Secondary | ICD-10-CM | POA: Diagnosis not present

## 2022-04-02 DIAGNOSIS — K648 Other hemorrhoids: Secondary | ICD-10-CM | POA: Diagnosis not present

## 2022-04-02 DIAGNOSIS — R1114 Bilious vomiting: Secondary | ICD-10-CM

## 2022-04-02 DIAGNOSIS — R062 Wheezing: Secondary | ICD-10-CM

## 2022-04-02 DIAGNOSIS — D123 Benign neoplasm of transverse colon: Secondary | ICD-10-CM | POA: Insufficient documentation

## 2022-04-02 DIAGNOSIS — D122 Benign neoplasm of ascending colon: Secondary | ICD-10-CM | POA: Diagnosis not present

## 2022-04-02 DIAGNOSIS — K921 Melena: Secondary | ICD-10-CM

## 2022-04-02 DIAGNOSIS — R0602 Shortness of breath: Secondary | ICD-10-CM

## 2022-04-02 DIAGNOSIS — R634 Abnormal weight loss: Secondary | ICD-10-CM

## 2022-04-02 DIAGNOSIS — K6289 Other specified diseases of anus and rectum: Secondary | ICD-10-CM | POA: Diagnosis not present

## 2022-04-02 DIAGNOSIS — E785 Hyperlipidemia, unspecified: Secondary | ICD-10-CM | POA: Diagnosis not present

## 2022-04-02 DIAGNOSIS — R1030 Lower abdominal pain, unspecified: Secondary | ICD-10-CM | POA: Diagnosis not present

## 2022-04-02 DIAGNOSIS — K635 Polyp of colon: Secondary | ICD-10-CM

## 2022-04-02 DIAGNOSIS — D120 Benign neoplasm of cecum: Secondary | ICD-10-CM | POA: Diagnosis not present

## 2022-04-02 DIAGNOSIS — H9209 Otalgia, unspecified ear: Secondary | ICD-10-CM

## 2022-04-02 DIAGNOSIS — I509 Heart failure, unspecified: Secondary | ICD-10-CM | POA: Diagnosis not present

## 2022-04-02 DIAGNOSIS — Z79899 Other long term (current) drug therapy: Secondary | ICD-10-CM | POA: Insufficient documentation

## 2022-04-02 DIAGNOSIS — K644 Residual hemorrhoidal skin tags: Secondary | ICD-10-CM | POA: Diagnosis not present

## 2022-04-02 DIAGNOSIS — I130 Hypertensive heart and chronic kidney disease with heart failure and stage 1 through stage 4 chronic kidney disease, or unspecified chronic kidney disease: Secondary | ICD-10-CM | POA: Diagnosis not present

## 2022-04-02 DIAGNOSIS — Z7984 Long term (current) use of oral hypoglycemic drugs: Secondary | ICD-10-CM | POA: Insufficient documentation

## 2022-04-02 DIAGNOSIS — R197 Diarrhea, unspecified: Secondary | ICD-10-CM | POA: Diagnosis not present

## 2022-04-02 DIAGNOSIS — E1122 Type 2 diabetes mellitus with diabetic chronic kidney disease: Secondary | ICD-10-CM | POA: Diagnosis not present

## 2022-04-02 DIAGNOSIS — R630 Anorexia: Secondary | ICD-10-CM

## 2022-04-02 DIAGNOSIS — M3214 Glomerular disease in systemic lupus erythematosus: Secondary | ICD-10-CM | POA: Insufficient documentation

## 2022-04-02 DIAGNOSIS — R110 Nausea: Secondary | ICD-10-CM

## 2022-04-02 DIAGNOSIS — R609 Edema, unspecified: Secondary | ICD-10-CM

## 2022-04-02 HISTORY — PX: BIOPSY: SHX5522

## 2022-04-02 HISTORY — PX: COLONOSCOPY WITH PROPOFOL: SHX5780

## 2022-04-02 HISTORY — PX: POLYPECTOMY: SHX5525

## 2022-04-02 LAB — HM COLONOSCOPY

## 2022-04-02 LAB — GLUCOSE, CAPILLARY
Glucose-Capillary: 125 mg/dL — ABNORMAL HIGH (ref 70–99)
Glucose-Capillary: 54 mg/dL — ABNORMAL LOW (ref 70–99)

## 2022-04-02 SURGERY — COLONOSCOPY WITH PROPOFOL
Anesthesia: General

## 2022-04-02 MED ORDER — LACTATED RINGERS IV SOLN: INTRAVENOUS | Status: DC

## 2022-04-02 MED ORDER — LIDOCAINE 2% (20 MG/ML) 5 ML SYRINGE
INTRAMUSCULAR | Status: DC | PRN
  Administered 2022-04-02: 50 mg via INTRAVENOUS

## 2022-04-02 MED ORDER — PROPOFOL 10 MG/ML IV BOLUS
INTRAVENOUS | Status: DC | PRN
  Administered 2022-04-02: 20 mg via INTRAVENOUS
  Administered 2022-04-02: 60 mg via INTRAVENOUS

## 2022-04-02 MED ORDER — PHENYLEPHRINE 80 MCG/ML (10ML) SYRINGE FOR IV PUSH (FOR BLOOD PRESSURE SUPPORT)
PREFILLED_SYRINGE | INTRAVENOUS | Status: DC | PRN
  Administered 2022-04-02 (×2): 160 ug via INTRAVENOUS

## 2022-04-02 MED ORDER — DEXTROSE 50 % IV SOLN
INTRAVENOUS | Status: AC
  Administered 2022-04-02: 50 mL via INTRAVENOUS
  Filled 2022-04-02: qty 50

## 2022-04-02 MED ORDER — PROPOFOL 500 MG/50ML IV EMUL
INTRAVENOUS | Status: DC | PRN
  Administered 2022-04-02: 200 ug/kg/min via INTRAVENOUS

## 2022-04-02 MED ORDER — DEXTROSE 50 % IV SOLN: 1.0000 | Freq: Once | INTRAVENOUS | Status: AC

## 2022-04-02 NOTE — Op Note (Signed)
[facility] Patient Name: Joy Leach Procedure Date: 04/02/2022 8:22 AM MRN: [id number] Date of Birth: 1956/09/27 Attending MD: Maylon Peppers ,  CSN: [id number] Age: 66 Admit Type: Outpatient Procedure:                Colonoscopy Indications:              Lower abdominal pain, Clinically significant                            diarrhea of unexplained origin, Rectal bleeding Providers:                Maylon Peppers, Lambert Mody, Everardo Pacific, Randa Spike, Technician Referring MD:              Medicines:                Monitored Anesthesia Care Complications:            No immediate complications. Estimated Blood Loss:     Estimated blood loss: none. Procedure:                Pre-Anesthesia Assessment:                           - Prior to the procedure, a History and Physical                            was performed, and patient medications, allergies                            and sensitivities were reviewed. The patient's                            tolerance of previous anesthesia was reviewed.                           - The risks and benefits of the procedure and the                            sedation options and risks were discussed with the                            patient. All questions were answered and informed                            consent was obtained.                           - ASA Grade Assessment: II - A patient with mild                            systemic disease.                           After obtaining informed consent, the colonoscope  was passed under direct vision. Throughout the                            procedure, the patient's blood pressure, pulse, and                            oxygen saturations were monitored continuously. The                            PCF-HQ190L ([id number]) scope was introduced through                            the anus and advanced to the the cecum,  identified                            by appendiceal orifice and ileocecal valve. The                            colonoscopy was performed without difficulty. The                            patient tolerated the procedure well. The quality                            of the bowel preparation was excellent. Scope In: 8:35:38 AM Scope Out: 9:08:03 AM Scope Withdrawal Time: 0 hours 28 minutes 37 seconds  Total Procedure Duration: 0 hours 32 minutes 25 seconds  Findings:      Three sessile polyps were found in the ascending colon and cecum. The       polyps were 3 to 5 mm in size. These polyps were removed with a cold       snare. Resection and retrieval were complete.      A 2 mm polyp was found in the ascending colon. The polyp was sessile.       The polyp was removed with a cold biopsy forceps. Resection and       retrieval were complete.      Two sessile polyps were found in the transverse colon. The polyps were 4       to 5 mm in size. These polyps were removed with a cold snare. Resection       and retrieval were complete.      A few small-mouthed diverticula were found in the sigmoid colon. Random       colonic biopsies for histology were taken with a cold forceps from the       right colon and left colon for evaluation of microscopic colitis.      An area of mildly congested mucosa was found in the rectum. Biopsies       were taken with a cold forceps for histology.      Non-bleeding external and internal hemorrhoids were found during       retroflexion. The hemorrhoids were large.      Hemorrhoids were found on perianal exam. Impression:               - Three 3 to 5 mm polyps in the ascending colon and  in the cecum, removed with a cold snare. Resected                            and retrieved.                           - One 2 mm polyp in the ascending colon, removed                            with a cold biopsy forceps. Resected and retrieved.                            - Two 4 to 5 mm polyps in the transverse colon,                            removed with a cold snare. Resected and retrieved.                           - Diverticulosis in the sigmoid colon. Biopsied.                           - Congested mucosa in the rectum. normal colon                            Biopsied.                           - Non-bleeding external and internal hemorrhoids.                           - Hemorrhoids found on perianal exam. Moderate Sedation:      Per Anesthesia Care Recommendation:           - Discharge patient to home (ambulatory).                           - Resume previous diet.                           - Await pathology results.                           - Repeat colonoscopy for surveillance based on                            pathology results.                           - Resume Eliquis (apixaban) at prior dose tomorrow. Procedure Code(s):        --- Professional ---                           [phone], Colonoscopy, flexible; with removal of                            tumor(s), polyp(s), or other lesion(s)  by snare                            technique                           45380, 59, Colonoscopy, flexible; with biopsy,                            single or multiple Diagnosis Code(s):        --- Professional ---                           K63.5, Polyp of colon                           K64.8, Other hemorrhoids                           K62.89, Other specified diseases of anus and rectum                           R10.30, Lower abdominal pain, unspecified                           R19.7, Diarrhea, unspecified                           K62.5, Hemorrhage of anus and rectum                           K57.30, Diverticulosis of large intestine without                            perforation or abscess without bleeding CPT copyright 2019 American Medical Association. All rights reserved. The codes documented in this report are preliminary and upon coder  review may  be revised to meet current compliance requirements. Maylon Peppers, MD Maylon Peppers,  04/02/2022 9:21:54 AM This report has been signed electronically. Number of Addenda: 0

## 2022-04-02 NOTE — Discharge Instructions (Signed)
You are being discharged to home.  Resume your previous diet.  We are waiting for your pathology results.  Your physician has recommended a repeat colonoscopy for surveillance based on pathology results.  Resume taking Eliquis (apixaban) at your prior dose tomorrow.

## 2022-04-02 NOTE — Transfer of Care (Signed)
Immediate Anesthesia Transfer of Care Note  Patient: Joy Leach  Procedure(s) Performed: COLONOSCOPY WITH PROPOFOL POLYPECTOMY BIOPSY  Patient Location: Short Stay  Anesthesia Type:MAC  Level of Consciousness: awake, alert , oriented and patient cooperative  Airway & Oxygen Therapy: Patient Spontanous Breathing and Patient connected to nasal cannula oxygen  Post-op Assessment: Report given to RN, Post -op Vital signs reviewed and stable and Patient moving all extremities  Post vital signs: Reviewed and stable  Last Vitals:  Vitals Value Taken Time  BP    Temp    Pulse    Resp    SpO2      Last Pain:  Vitals:   04/02/22 0829  TempSrc:   PainSc: 0-No pain         Complications: No notable events documented.

## 2022-04-02 NOTE — H&P (Signed)
Joy Leach is an 66 y.o. female.   Chief Complaint: Diarrhea, rectal bleeding, nausea and abdominal pain HPI: 66 year old female with past medical history of CKD, diabetes, hypertension, hyperlipidemia, lupus, CHF, coming for evaluation of diarrhea, rectal bleeding, nausea and abdominal pain.  Patient reports chronic history of lower abdominal pain in her lower abdomen.  States that she has presented recurrent episodes of nausea and vomiting for the last 10 months along with chronic diarrhea up to 3 times per day.  Also presented some rectal bleeding occasionally.  The patient reports that after she started taking Ozempic her symptoms have worsened.  Past Medical History:  Diagnosis Date   CHF (congestive heart failure) (HCC)    Chronic kidney disease    Diabetes mellitus without complication ([place])    Headache    Hyperlipidemia    Hypertension    Lupus ([place])    Mycobacterial disease    Sicca ([place])     Past Surgical History:  Procedure Laterality Date   ABDOMINAL HYSTERECTOMY     BIOPSY  10/26/2021   Procedure: BIOPSY;  Surgeon: Harvel Quale, MD;  Location: [facility];  Service: Gastroenterology;;   COLONOSCOPY  05/26/2015   Dr Britta Mccreedy, mild diverticulosis in sigmoid colon, biosies taken from [facility] and sigmoid colon-negative   ESOPHAGOGASTRODUODENOSCOPY (EGD) WITH PROPOFOL N/A 10/26/2021   Procedure: ESOPHAGOGASTRODUODENOSCOPY (EGD) WITH PROPOFOL;  Surgeon: Harvel Quale, MD;  Location: [facility];  Service: Gastroenterology;  Laterality: N/A;  [place] PLACEMENT  10/26/2021   Procedure: HEMOSTASIS CLIP PLACEMENT;  Surgeon: Harvel Quale, MD;  Location: [facility];  Service: Gastroenterology;;   HOT HEMOSTASIS  10/26/2021   Procedure: HOT HEMOSTASIS (ARGON PLASMA COAGULATION/BICAP);  Surgeon: Montez Morita, Quillian Quince, MD;  Location: [facility];  Service: Gastroenterology;;   hystorectomy     POLYPECTOMY  10/26/2021    Procedure: POLYPECTOMY;  Surgeon: Harvel Quale, MD;  Location: [facility];  Service: Gastroenterology;;   SINUS SURGERY WITH INSTATRAK  2015    Family History  Problem Relation Age of Onset   Hypertension Mother    Diabetes Mother    Kidney disease Mother    Heart disease Mother    High Cholesterol Mother    Lung cancer Mother    Hypertension Father    Diabetes Father    Kidney disease Father    Heart disease Father    Social History:  reports that she has never smoked. She has never been exposed to tobacco smoke. She has never used smokeless tobacco. She reports that she does not drink alcohol and does not use drugs.  Allergies:  Allergies  Allergen Reactions   Sulfa Antibiotics Hives   Caffeine Other (See Comments)    Bladder infections   Codeine Nausea And Vomiting    Medications Prior to Admission  Medication Sig Dispense Refill   albuterol (VENTOLIN HFA) 108 (90 Base) MCG/ACT inhaler Inhale 1 puff into the lungs every 6 (six) hours as needed for shortness of breath.     apixaban (ELIQUIS) 2.5 MG TABS tablet Take 2.5 mg by mouth 2 (two) times daily.     azithromycin (ZITHROMAX) 250 MG tablet Take 250 mg by mouth daily.     cetirizine (ZYRTEC) 10 MG tablet Take 10 mg by mouth daily.     diclofenac sodium (VOLTAREN) 1 % GEL Apply 1 application topically 3 (three) times daily as needed (knee pain.).     DULoxetine (CYMBALTA) 30 MG capsule Take 30  mg by mouth daily.     fenofibrate (TRICOR) 145 MG tablet Take 145 mg by mouth every evening.     furosemide (LASIX) 20 MG tablet TAKE (1) TABLET BY MOUTH ONCE DAILY AS NEEDED. (Patient taking differently: Take 20 mg by mouth daily as needed (leg swelling).) 30 tablet 0   HYDROcodone-acetaminophen (NORCO/VICODIN) 5-325 MG tablet Take 1 tablet by mouth daily as needed (pain.).     JANUMET 50-1000 MG tablet Take 1 tablet by mouth 2 (two) times daily.     ketoconazole (NIZORAL) 2 % cream Apply 1 application  topically  daily.     loperamide (IMODIUM A-D) 2 MG tablet Take 2 mg by mouth 2 (two) times daily.     montelukast (SINGULAIR) 10 MG tablet Take 10 mg by mouth at bedtime.  2   moxifloxacin (AVELOX) 400 MG tablet Take 400 mg by mouth daily.     Multiple Vitamin (MULTIVITAMIN) tablet Take 1 tablet by mouth in the morning.     omeprazole (PRILOSEC) 20 MG capsule Take 20 mg by mouth daily.     ondansetron (ZOFRAN) 4 MG tablet Take 1-2 tablets (4-8 mg total) by mouth every 8 (eight) hours as needed for nausea or vomiting. 30 tablet 3   pantoprazole (PROTONIX) 40 MG tablet TAKE 1 TABLET BY MOUTH ONCE DAILY. 90 tablet 1   pioglitazone (ACTOS) 15 MG tablet Take 15 mg by mouth daily.     polyethylene glycol-electrolytes (TRILYTE) 420 g solution Take 4,000 mLs by mouth as directed. 4000 mL 0   predniSONE (DELTASONE) 10 MG tablet Take 10 mg by mouth daily with breakfast.     pregabalin (LYRICA) 75 MG capsule Take 75 mg by mouth every 8 (eight) hours.     Probiotic Product (PROBIOTIC PO) Take 1 tablet by mouth daily.     rifabutin (MYCOBUTIN) 150 MG capsule Take 300 mg by mouth daily.     Turmeric 450 MG CAPS Take 450 mg by mouth at bedtime.     Vitamin D, Ergocalciferol, (DRISDOL) 1.25 MG (50000 UNIT) CAPS capsule Take 50,000 Units by mouth See admin instructions. Every three weeks     OZEMPIC, 1 MG/DOSE, 4 MG/3ML SOPN Inject into the skin. Once weekly (Patient not taking: Reported on 03/25/2022)     promethazine (PHENERGAN) 25 MG tablet Take 25-50 mg by mouth every 8 (eight) hours as needed for nausea or vomiting. (Patient not taking: Reported on 03/25/2022)      No results found for this or any previous visit (from the past 48 hour(s)). No results found.  Review of Systems  Constitutional: Negative.   HENT: Negative.    Eyes: Negative.   Respiratory: Negative.    Cardiovascular: Negative.   Gastrointestinal:  Positive for abdominal pain, blood in stool, diarrhea and nausea.  Endocrine: Negative.    Genitourinary: Negative.   Musculoskeletal: Negative.   Skin: Negative.   Allergic/Immunologic: Negative.   Neurological: Negative.   Hematological: Negative.   Psychiatric/Behavioral: Negative.      Blood pressure (!) 109/54, pulse 69, temperature 98 F (36.7 C), temperature source Oral, resp. rate 18, height '5\' 5"'$  (1.651 m), weight 66.7 kg, SpO2 97 %. Physical Exam  GENERAL: The patient is AO x3, in no acute distress.  Obese. HEENT: Head is normocephalic and atraumatic. EOMI are intact. Mouth is well hydrated and without lesions. NECK: Supple. No masses LUNGS: Clear to auscultation. No presence of rhonchi/wheezing/rales. Adequate chest expansion HEART: RRR, normal s1 and s2. ABDOMEN: Soft, nontender, no  guarding, no peritoneal signs, and nondistended. BS +. No masses. EXTREMITIES: Without any cyanosis, clubbing, rash, lesions or edema. NEUROLOGIC: AOx3, no focal motor deficit. SKIN: no jaundice, no rashes  Assessment/Plan 66 year old female with past medical history of CKD, diabetes, hypertension, hyperlipidemia, lupus, CHF, coming for evaluation of diarrhea, rectal bleeding, nausea and abdominal pain.  We will proceed with colonoscopy.  Harvel Quale, MD 04/02/2022, 7:33 AM

## 2022-04-02 NOTE — Anesthesia Postprocedure Evaluation (Signed)
Anesthesia Post Note  Patient: Joy Leach  Procedure(s) Performed: COLONOSCOPY WITH PROPOFOL POLYPECTOMY BIOPSY  Patient location during evaluation: Phase II Anesthesia Type: General Level of consciousness: awake and alert and oriented Pain management: pain level controlled Vital Signs Assessment: post-procedure vital signs reviewed and stable Respiratory status: spontaneous breathing, nonlabored ventilation and respiratory function stable Cardiovascular status: blood pressure returned to baseline and stable Postop Assessment: no apparent nausea or vomiting Anesthetic complications: no   No notable events documented.   Last Vitals:  Vitals:   04/02/22 0723 04/02/22 0917  BP: (!) 109/54 (!) 92/53  Pulse: 69 71  Resp: 18 (!) 23  Temp: 36.7 C 36.8 C  SpO2: 97% 98%    Last Pain:  Vitals:   04/02/22 0917  TempSrc: Oral  PainSc: 0-No pain                 Elianny Buxbaum C Lenix Benoist

## 2022-04-02 NOTE — Anesthesia Preprocedure Evaluation (Signed)
Anesthesia Evaluation  Patient identified by MRN, date of birth, ID band Patient awake    Reviewed: Allergy & Precautions, NPO status , Patient's Chart, lab work & pertinent test results  Airway Mallampati: II  TM Distance: >3 FB Neck ROM: Full    Dental  (+) Dental Advisory Given, Teeth Intact   Pulmonary shortness of breath and with exertion, PE   Pulmonary exam normal breath sounds clear to auscultation       Cardiovascular Exercise Tolerance: Good hypertension, Pt. on medications +CHF and + DVT  Normal cardiovascular exam Rhythm:Regular Rate:Normal     Neuro/Psych  Headaches,    GI/Hepatic Bowel prep,GERD  Medicated and Controlled,  Endo/Other  diabetes, Well Controlled, Type 2, Oral Hypoglycemic Agents  Renal/GU Renal InsufficiencyRenal disease     Musculoskeletal negative musculoskeletal ROS (+)   Abdominal   Peds  Hematology negative hematology ROS (+)   Anesthesia Other Findings Lupus   Reproductive/Obstetrics negative OB ROS                            Anesthesia Physical Anesthesia Plan  ASA: 3  Anesthesia Plan: General   Post-op Pain Management: Minimal or no pain anticipated   Induction:   PONV Risk Score and Plan: Propofol infusion  Airway Management Planned: Nasal Cannula  Additional Equipment:   Intra-op Plan:   Post-operative Plan:   Informed Consent: I have reviewed the patients History and Physical, chart, labs and discussed the procedure including the risks, benefits and alternatives for the proposed anesthesia with the patient or authorized representative who has indicated his/her understanding and acceptance.     Dental advisory given  Plan Discussed with: CRNA and Surgeon  Anesthesia Plan Comments:        Anesthesia Quick Evaluation

## 2022-04-03 ENCOUNTER — Encounter (INDEPENDENT_AMBULATORY_CARE_PROVIDER_SITE_OTHER): Payer: Self-pay | Admitting: *Deleted

## 2022-04-03 LAB — SURGICAL PATHOLOGY

## 2022-04-08 ENCOUNTER — Encounter (HOSPITAL_COMMUNITY): Payer: Self-pay | Admitting: Gastroenterology

## 2022-06-24 ENCOUNTER — Ambulatory Visit (INDEPENDENT_AMBULATORY_CARE_PROVIDER_SITE_OTHER): Payer: Medicare HMO | Admitting: Gastroenterology

## 2022-06-24 ENCOUNTER — Encounter (INDEPENDENT_AMBULATORY_CARE_PROVIDER_SITE_OTHER): Payer: Self-pay | Admitting: Gastroenterology

## 2022-06-24 VITALS — BP 100/66 | HR 73 | Temp 97.9°F | Ht 65.0 in | Wt 156.4 lb

## 2022-06-24 DIAGNOSIS — R110 Nausea: Secondary | ICD-10-CM | POA: Diagnosis not present

## 2022-06-24 DIAGNOSIS — R197 Diarrhea, unspecified: Secondary | ICD-10-CM | POA: Diagnosis not present

## 2022-06-24 DIAGNOSIS — K625 Hemorrhage of anus and rectum: Secondary | ICD-10-CM | POA: Insufficient documentation

## 2022-06-24 MED ORDER — HYDROCORTISONE (PERIANAL) 2.5 % EX CREA: 1.0000 | TOPICAL_CREAM | Freq: Four times a day (QID) | CUTANEOUS | 1 refills | Status: AC

## 2022-06-24 NOTE — Patient Instructions (Signed)
It was nice to see you! I am sending anusol cream for you to use on hemorhoids, please use this 4x/day x10 days then as needed after that.  Please continue protonix once daily and daily probiotic You can continue with the imodium as needed as well. You can let me know if you would like to proceed with surgical evaluation for your gallbladder  Follow up 6 months

## 2022-06-24 NOTE — Progress Notes (Signed)
Referring Provider: Georgann Housekeeper, NP Primary Care Physician:  Georgann Housekeeper, NP Primary GI Physician: Jenetta Downer   Chief Complaint  Patient presents with   Blood In Stools    4 month follow up on blood in stools. Reports bleeding is more now than what it was. Using prep h on hemorrhoids.    HPI:   Joy Leach is a 66 y.o. female with past medical history of CHF, HLD, HTN, Lupus, cutaneous mycobacterial disease  Patient presenting today for follow up of rectal bleeding and vomiting   Last seen 02/21/22, at that time with blood in stools, diarrhea and nausea.  At that time, she had recently started ozempic, having worsening diarrhea afterwards. States that she had trouble even keeping water in as she would have diarrhea shortly after. ongoing diarrhea, though worsened with ozempic, having 2-3 BMs per day,stools loose to watery. States that her GYN doctor did a stool test during her recent exam and was told that there was presence of blood. ongoing nausea. Having to avoid any kind of spices on food as well as greasy/heavy foods because these increase her nausea. no GERD symptoms, feels that protonix Is keeping her symptoms well managed.  noticed some blood in her stools, both when she wipes and mixed in with her stools at times. taking imodium BID without much relief.    BP 84/55 in office. endorsed some dizziness and lightheadedness recently, PCP following up on this.  Patient recommended to have Colonoscopy, continue daily probiotic, PPI daily, RUQ [place] to rule out GB etiology of nausea, continue to avoid trigger foods and zofran PRN. RUQ [place] and Colonoscopy as outlined below, advised to implement mediterranean diet and exercise as able.   Present: Recent labs with Hgb 10.3 in June and 10.9 in august, baseline around 10-11 range over the past year with last MCV 86  Patient states that she continues to have intermittent diarrhea, she is having toilet tissues hematochezia with a decent  amount of blood noted but no blood in the toilet or mixed in with stools, this occurs 2-3x/week. She is using preparation H with some improvement. Stopped ozempic and started on actos due to worsening diarrhea with ozempic. Symptoms seemed to have improved some with this. She is having rectal itching, burning and pain. She is having diarrhea 2-3x/week. She is taking imodium daily with good results. Other times she has more formed stools, diarrhea ranges from loose to watery. She continues to endorse some darker stools most of the time, she is on a multi vitamin and PO iron pill. She is also in the process of starting B12 shots tomorrow. She is having lower abdominal pain a few times per week, she also notes she has some intertrigal yeast that causes her burning in that area as well, difficult to differentiate source of her pain. She is using topical yeast cream for this.   She is continued on 3 different antibiotics for her skin condition, she is continued on daily probiotic, states that she is still having work up at [facility] for her skin issues (mycobacterial disease) and antibiotics may be switched soon.   She is still having nausea daily and taking a zofran daily with good results. Appetite is up and down but her sister states it is better than it was previously. Up 9 pounds since June. GERD is well managed with protonix, has very infrequent GERD symptoms. Denies dysphagia or odynophagia. She continues to avoid greasy, fatty, spicy foods.   RUQ [place]:  03/04/22 increased echogenicity of the liver parenchyma which may represent hepatic steatosis and/or other hepatocellular disease. 2. Gallbladder adenomyomatosis. Last Colonoscopy:04/02/22 - Three 3 to 5 mm polyps in the ascending colon and in the cecum - One 2 mm polyp in the ascending colon - Two 4 to 5 mm polyps in the transverse colon  - Diverticulosis in the sigmoid colon. Biopsied. - Congested mucosa in the rectum. normal colon Biopsied. - Non-bleeding  external and internal hemorrhoids. - Hemorrhoids found on perianal exam. (6 TAs, normal colonic biopsies) Last Endoscopy:10/26/21 normal esophagus  - Multiple gastric polyps. Fundic gland polyps - rest of the examined stomach was normal. Biopsied-normal - Normal duodenum. Biopsied-normal    Recommendations:  Repeat Colon in 3 years  Past Medical History:  Diagnosis Date   CHF (congestive heart failure) ([place])    Chronic kidney disease    Diabetes mellitus without complication ([place])    Headache    Hyperlipidemia    Hypertension    Lupus ([place])    Mycobacterial disease    Sicca ([place])     Past Surgical History:  Procedure Laterality Date   ABDOMINAL HYSTERECTOMY     BIOPSY  10/26/2021   Procedure: BIOPSY;  Surgeon: Harvel Quale, MD;  [facility];  Service: Gastroenterology;;   BIOPSY  04/02/2022   Procedure: BIOPSY;  Surgeon: Harvel Quale, MD;  [facility];  Service: Gastroenterology;;   COLONOSCOPY  05/26/2015   Dr Britta Mccreedy, mild diverticulosis in sigmoid colon, biosies taken [facility] and sigmoid colon-negative   COLONOSCOPY WITH PROPOFOL N/A 04/02/2022   Procedure: COLONOSCOPY WITH PROPOFOL;  Surgeon: Harvel Quale, MD;  [facility];  Service: Gastroenterology;  Laterality: N/A;  815   ESOPHAGOGASTRODUODENOSCOPY (EGD) WITH PROPOFOL N/A 10/26/2021   Procedure: ESOPHAGOGASTRODUODENOSCOPY (EGD) WITH PROPOFOL;  Surgeon: Harvel Quale, MD;  [facility];  Service: Gastroenterology;  Laterality: N/A;  [place] PLACEMENT  10/26/2021   Procedure: HEMOSTASIS CLIP PLACEMENT;  Surgeon: Harvel Quale, MD;  [facility];  Service: Gastroenterology;;   HOT HEMOSTASIS  10/26/2021   Procedure: HOT HEMOSTASIS (ARGON PLASMA COAGULATION/BICAP);  Surgeon: Montez Morita, Quillian Quince, MD;  [facility];  Service: Gastroenterology;;   hystorectomy     POLYPECTOMY   10/26/2021   Procedure: POLYPECTOMY;  Surgeon: Harvel Quale, MD;  [facility];  Service: Gastroenterology;;   POLYPECTOMY  04/02/2022   Procedure: POLYPECTOMY;  Surgeon: Montez Morita, Quillian Quince, MD;  [facility];  Service: Gastroenterology;;   SINUS SURGERY WITH INSTATRAK  2015    Current Outpatient Medications  Medication Sig Dispense Refill   albuterol (VENTOLIN HFA) 108 (90 Base) MCG/ACT inhaler Inhale 1 puff into the lungs every 6 (six) hours as needed for shortness of breath.     apixaban (ELIQUIS) 2.5 MG TABS tablet Take 2.5 mg by mouth 2 (two) times daily.     azithromycin (ZITHROMAX) 250 MG tablet Take 250 mg by mouth daily.     DULoxetine (CYMBALTA) 30 MG capsule Take 30 mg by mouth daily.     ezetimibe (ZETIA) 10 MG tablet Take 10 mg by mouth daily.     fenofibrate (TRICOR) 145 MG tablet Take 145 mg by mouth every evening.     furosemide (LASIX) 20 MG tablet TAKE (1) TABLET BY MOUTH ONCE DAILY AS NEEDED. (Patient taking differently: Take 20 mg by mouth daily as needed (leg swelling).) 30 tablet 0   JANUMET  50-1000 MG tablet Take 1 tablet by mouth 2 (two) times daily.     loperamide (IMODIUM A-D) 2 MG tablet Take 2 mg by mouth 2 (two) times daily.     MECLIZINE HCL PO Take by mouth. As needed     montelukast (SINGULAIR) 10 MG tablet Take 10 mg by mouth at bedtime.  2   moxifloxacin (AVELOX) 400 MG tablet Take 400 mg by mouth daily.     Multiple Vitamin (MULTIVITAMIN) tablet Take 1 tablet by mouth in the morning.     ondansetron (ZOFRAN) 4 MG tablet Take 1-2 tablets (4-8 mg total) by mouth every 8 (eight) hours as needed for nausea or vomiting. 30 tablet 3   pantoprazole (PROTONIX) 40 MG tablet TAKE 1 TABLET BY MOUTH ONCE DAILY. 90 tablet 1   pioglitazone (ACTOS) 15 MG tablet Take 15 mg by mouth daily.     predniSONE (DELTASONE) 10 MG tablet Take 10 mg by mouth daily with breakfast.     pregabalin (LYRICA) 75 MG capsule Take 75 mg by mouth every  8 (eight) hours.     Probiotic Product (PROBIOTIC PO) Take 1 tablet by mouth daily.     rifabutin (MYCOBUTIN) 150 MG capsule Take 300 mg by mouth daily.     Turmeric 450 MG CAPS Take 450 mg by mouth at bedtime.     Vitamin D, Ergocalciferol, (DRISDOL) 1.25 MG (50000 UNIT) CAPS capsule Take 50,000 Units by mouth See admin instructions. Every three weeks     diclofenac sodium (VOLTAREN) 1 % GEL Apply 1 application topically 3 (three) times daily as needed (knee pain.).     No current facility-administered medications for this visit.    Allergies as of 06/24/2022 - Review Complete 06/24/2022  Allergen Reaction Noted   Sulfa antibiotics Hives 06/13/2016   Caffeine Other (See Comments) 05/01/2021   Codeine Nausea And Vomiting 06/13/2016    Family History  Problem Relation Age of Onset   Hypertension Mother    Diabetes Mother    Kidney disease Mother    Heart disease Mother    High Cholesterol Mother    Lung cancer Mother    Hypertension Father    Diabetes Father    Kidney disease Father    Heart disease Father     Social History   Socioeconomic History   Marital status: Divorced    Spouse name: Not on file   Number of children: 1   Years of education: Not on file   Highest education level: Not on file  Occupational History   Not on file  Tobacco Use   Smoking status: Never    Passive exposure: Never   Smokeless tobacco: Never  Vaping Use   Vaping Use: Never used  Substance and Sexual Activity   Alcohol use: No   Drug use: No   Sexual activity: Yes    Partners: Male  Other Topics Concern   Not on file  Social History Narrative   Lives home alone.  Widow.    Is Disabled.  One child.  Sister, [name].   Social Determinants of Health   Financial Resource Strain: Not on file  Food Insecurity: Not on file  Transportation Needs: Not on file  Physical Activity: Not on file  Stress: Not on file  Social Connections: Not on file   Review of systems General: negative  for malaise, night sweats, fever, chills, weight loss Neck: Negative for lumps, goiter, pain and significant neck swelling Resp: Negative for cough, wheezing,  dyspnea at rest CV: Negative for chest pain, leg swelling, palpitations, orthopnea GI: denies vomiting, constipation, dysphagia, odyonophagia, early satiety or unintentional weight loss. +nausea +toilet tissue hematochezia +darker stools (on iron) +lower abdominal pain  MSK: Negative for joint pain or swelling, back pain, and muscle pain. Derm: Negative for itching, chronic erythema and excoriation of skin from mycobacterial disease Psych: Denies depression, anxiety, memory loss, confusion. No homicidal or suicidal ideation.  Heme: Negative for prolonged bleeding, bruising easily, and swollen nodes. Endocrine: Negative for cold or heat intolerance, polyuria, polydipsia and goiter. Neuro: negative for tremor, gait imbalance, syncope and seizures. The remainder of the review of systems is noncontributory.  Physical Exam: There were no vitals taken for this visit. General:   Alert and oriented. No distress noted. Pleasant and cooperative.  Head:  Normocephalic and atraumatic. Eyes:  Conjuctiva clear without scleral icterus. Mouth:  Oral mucosa pink and moist. Good dentition. No lesions. Heart: Normal rate and rhythm, s1 and s2 heart sounds present.  Lungs: Clear lung sounds in all lobes. Respirations equal and unlabored. Abdomen:  +BS, soft, non-tender and non-distended. No rebound or guarding. No HSM or masses noted. Derm: No palmar erythema or jaundice Msk:  Symmetrical without gross deformities. Normal posture. Extremities:  Without edema. Neurologic:  Alert and  oriented x4 Psych:  Alert and cooperative. Normal mood and affect.  Invalid input(s): "6 MONTHS"   ASSESSMENT: Earlyne Feeser is a 66 y.o. female presenting today for follow up of diarrhea, rectal bleeding and nausea.  Diarrhea has been ongoing, somewhat improved since  stopping ozempic and starting Actose. Notes diarrhea 2-3 days out of the week with formed stools in between. Taking imodium daily with good results. Notably maintained on multiple abx for her mycobacterial disease, which are likely contributing some to her diarrhea. Recommend she continue to use imodium PRN and keep taking daily probiotic.   Rectal bleeding continues 2-3x/week with toilet tissue hematochezia and darker stools in presence of PO iron, recent colonoscopy with multiple polyps and internal and external hemorrhoids, suspect these are the source of her continued rectal bleeding as she also notes itching, pain and burning to her rectum, using preparation H with some improvement in symptoms though not complete resolution. She is ultimately not a great candidate for hemorrhoid banding given she is maintained on Eliquis. Will send Rx for anusol to try and calm down the hemorrhoids. Her hgb remains in the 10 range with last value 10.9 in August, she is maintained on PO Iron, multivitamin and is starting B12 injections tomorrow, though I do not have B12 values to review, suspect that anemia is related to chronic disease especially in presence of Lupus and Mycobacterial disease, we will continue to monitor stability of hgb and severity of her rectal bleeding, should consider further evaluation with capsule endoscopy if bleeding becomes heavier or hgb declines further.  Nausea is also ongoing, taking zofran PRN with good results. Up 9 pounds since last visit. recent RUQ [place] in May without gallstones or evidence of cholecystitis, she did have presence of adenomyomatosis of the GB, this could potentially be contributing to her nausea, however, she has no RUQ pain, she does note intolerance to fatty foods that has been ongoing for years. May consider referral to Gen Surg for evaluation of possible cholecystectomy, she prefers to hold off on this for now as she has a lot going on with other aspects of her health.  She will make me aware if/when she is ready to proceed with  this. Most recent LFTs WNL.   PLAN:  Rx Anusol QID x10 days then PRN thereafter 2. Continue probitoic daily  3. Consider surgical referral for GB 4. Continue imodium PRN 5. Continue PPI Daily 6. Zofran PRN 7. Continue to avoid trigger foods  All questions were answered, patient verbalized understanding and is in agreement with plan as outlined above.    Follow Up: 6 months   Joy Leach L. Alver Sorrow, MSN, APRN, AGNP-C Adult-Gerontology Nurse Practitioner [facility]

## 2022-07-02 DIAGNOSIS — D638 Anemia in other chronic diseases classified elsewhere: Secondary | ICD-10-CM | POA: Insufficient documentation

## 2022-07-02 DIAGNOSIS — Z86718 Personal history of other venous thrombosis and embolism: Secondary | ICD-10-CM | POA: Insufficient documentation

## 2022-07-02 DIAGNOSIS — N1832 Chronic kidney disease, stage 3b: Secondary | ICD-10-CM | POA: Insufficient documentation

## 2022-09-02 ENCOUNTER — Encounter (INDEPENDENT_AMBULATORY_CARE_PROVIDER_SITE_OTHER): Payer: Self-pay | Admitting: Gastroenterology

## 2022-11-01 ENCOUNTER — Other Ambulatory Visit (INDEPENDENT_AMBULATORY_CARE_PROVIDER_SITE_OTHER): Payer: Self-pay | Admitting: Gastroenterology

## 2022-11-27 ENCOUNTER — Emergency Department (HOSPITAL_COMMUNITY)
Admission: EM | Admit: 2022-11-27 | Discharge: 2022-11-27 | Disposition: A | Discharge status: Home / Self Care | Payer: Medicare HMO | Attending: Emergency Medicine | Admitting: Emergency Medicine

## 2022-11-27 ENCOUNTER — Emergency Department (HOSPITAL_COMMUNITY): Payer: Medicare HMO

## 2022-11-27 ENCOUNTER — Encounter (HOSPITAL_COMMUNITY): Payer: Self-pay | Admitting: Emergency Medicine

## 2022-11-27 ENCOUNTER — Other Ambulatory Visit: Payer: Self-pay

## 2022-11-27 DIAGNOSIS — Z7901 Long term (current) use of anticoagulants: Secondary | ICD-10-CM | POA: Insufficient documentation

## 2022-11-27 DIAGNOSIS — N189 Chronic kidney disease, unspecified: Secondary | ICD-10-CM | POA: Diagnosis not present

## 2022-11-27 DIAGNOSIS — R109 Unspecified abdominal pain: Secondary | ICD-10-CM | POA: Diagnosis present

## 2022-11-27 DIAGNOSIS — Z7984 Long term (current) use of oral hypoglycemic drugs: Secondary | ICD-10-CM | POA: Diagnosis not present

## 2022-11-27 DIAGNOSIS — N200 Calculus of kidney: Secondary | ICD-10-CM | POA: Insufficient documentation

## 2022-11-27 DIAGNOSIS — K922 Gastrointestinal hemorrhage, unspecified: Secondary | ICD-10-CM | POA: Insufficient documentation

## 2022-11-27 DIAGNOSIS — E119 Type 2 diabetes mellitus without complications: Secondary | ICD-10-CM | POA: Diagnosis not present

## 2022-11-27 DIAGNOSIS — I509 Heart failure, unspecified: Secondary | ICD-10-CM | POA: Diagnosis not present

## 2022-11-27 DIAGNOSIS — I130 Hypertensive heart and chronic kidney disease with heart failure and stage 1 through stage 4 chronic kidney disease, or unspecified chronic kidney disease: Secondary | ICD-10-CM | POA: Insufficient documentation

## 2022-11-27 LAB — URINALYSIS, W/ REFLEX TO CULTURE (INFECTION SUSPECTED)
Bilirubin Urine: NEGATIVE
Glucose, UA: NEGATIVE mg/dL
Ketones, ur: NEGATIVE mg/dL
Nitrite: NEGATIVE
Protein, ur: 30 mg/dL — AB
RBC / HPF: >50 RBC/hpf (ref 0–5)
Specific Gravity, Urine: 1.015 (ref 1.005–1.030)
pH: 8 (ref 5.0–8.0)

## 2022-11-27 LAB — COMPREHENSIVE METABOLIC PANEL
ALT: 15 U/L (ref 0–44)
AST: 26 U/L (ref 15–41)
Albumin: 3.9 g/dL (ref 3.5–5.0)
Alkaline Phosphatase: 99 U/L (ref 38–126)
Anion gap: 9 (ref 5–15)
BUN: 17 mg/dL (ref 8–23)
CO2: 26 mmol/L (ref 22–32)
Calcium: 10.9 mg/dL — ABNORMAL HIGH (ref 8.9–10.3)
Chloride: 104 mmol/L (ref 98–111)
Creatinine, Ser: 1.72 mg/dL — ABNORMAL HIGH (ref 0.44–1.00)
GFR, Estimated: 33 mL/min — ABNORMAL LOW (ref >60)
Glucose, Bld: 97 mg/dL (ref 70–99)
Potassium: 3.5 mmol/L (ref 3.5–5.1)
Sodium: 139 mmol/L (ref 135–145)
Total Bilirubin: 0.8 mg/dL (ref 0.3–1.2)
Total Protein: 6.6 g/dL (ref 6.5–8.1)

## 2022-11-27 LAB — CBC WITH DIFFERENTIAL/PLATELET
Abs Immature Granulocytes: 0.02 10*3/uL (ref 0.00–0.07)
Basophils Absolute: 0.1 10*3/uL (ref 0.0–0.1)
Basophils Relative: 1 %
Eosinophils Absolute: 0.2 10*3/uL (ref 0.0–0.5)
Eosinophils Relative: 4 %
HCT: 36.1 % (ref 36.0–46.0)
Hemoglobin: 11.7 g/dL — ABNORMAL LOW (ref 12.0–15.0)
Immature Granulocytes: 0 %
Lymphocytes Relative: 19 %
Lymphs Abs: 1.1 10*3/uL (ref 0.7–4.0)
MCH: 28.2 pg (ref 26.0–34.0)
MCHC: 32.4 g/dL (ref 30.0–36.0)
MCV: 87 fL (ref 80.0–100.0)
Monocytes Absolute: 0.8 10*3/uL (ref 0.1–1.0)
Monocytes Relative: 13 %
Neutro Abs: 3.7 10*3/uL (ref 1.7–7.7)
Neutrophils Relative %: 63 %
Platelets: 179 10*3/uL (ref 150–400)
RBC: 4.15 MIL/uL (ref 3.87–5.11)
RDW: 15.4 % (ref 11.5–15.5)
WBC: 5.9 10*3/uL (ref 4.0–10.5)
nRBC: 0 % (ref 0.0–0.2)

## 2022-11-27 MED ORDER — ONDANSETRON 4 MG PO TBDP: 4.0000 mg | ORAL_TABLET | Freq: Three times a day (TID) | ORAL | 0 refills | Status: DC | PRN

## 2022-11-27 MED ORDER — ONDANSETRON HCL 4 MG/2ML IJ SOLN
4.0000 mg | Freq: Once | INTRAMUSCULAR | Status: AC
  Administered 2022-11-27: 4 mg via INTRAVENOUS
  Filled 2022-11-27: qty 2

## 2022-11-27 MED ORDER — SODIUM CHLORIDE 0.9 % IV BOLUS
500.0000 mL | Freq: Once | INTRAVENOUS | Status: AC
  Administered 2022-11-27: 500 mL via INTRAVENOUS

## 2022-11-27 MED ORDER — IOHEXOL 350 MG/ML SOLN
75.0000 mL | Freq: Once | INTRAVENOUS | Status: AC | PRN
  Administered 2022-11-27: 75 mL via INTRAVENOUS

## 2022-11-27 MED ORDER — HYDROMORPHONE HCL 1 MG/ML IJ SOLN
1.0000 mg | Freq: Once | INTRAMUSCULAR | Status: AC
  Administered 2022-11-27: 1 mg via INTRAVENOUS
  Filled 2022-11-27: qty 1

## 2022-11-27 MED ORDER — HYDROCODONE-ACETAMINOPHEN 5-325 MG PO TABS: 1.0000 | ORAL_TABLET | Freq: Four times a day (QID) | ORAL | 0 refills | Status: DC | PRN

## 2022-11-27 NOTE — ED Provider Notes (Signed)
[facility] Provider Note   CSN: [id number] Arrival date & time: 11/27/22  [id number]     History {Add pertinent medical, surgical, social history, OB history to HPI:1} Chief Complaint  Patient presents with   Abdominal Pain    Joy Leach is a 67 y.o. female.   Abdominal Pain Patient presents abdominal pain.  Right lower quadrant.  Has had some mild pain last couple days but much severe last night.  Had some nausea vomiting diarrhea.  States she has been passing blood.  Pain is reportedly 10 out of 10.  Reportedly has had some blood in the stool for a week now.  Is on Eliquis for previous DVTs.  Also has chronic somewhat disseminated mycobacterial disease.  History of lupus and is on 5 mg of prednisone a day   Past Medical History:  Diagnosis Date   CHF (congestive heart failure) ([facility])    Chronic kidney disease    Diabetes mellitus without complication ([place])    Headache    Hyperlipidemia    Hypertension    Lupus ([place])    Mycobacterial disease    Sicca ([place])     Home Medications Prior to Admission medications   Medication Sig Start Date End Date Taking? Authorizing Provider  albuterol (VENTOLIN HFA) 108 (90 Base) MCG/ACT inhaler Inhale 1 puff into the lungs every 6 (six) hours as needed for shortness of breath.    [provider]  apixaban (ELIQUIS) 2.5 MG TABS tablet Take 5 mg by mouth 2 (two) times daily. 03/26/21   [provider]  azithromycin (ZITHROMAX) 250 MG tablet Take 250 mg by mouth daily. 02/19/22   [provider]  diclofenac sodium (VOLTAREN) 1 % GEL Apply 1 application topically 3 (three) times daily as needed (knee pain.). 02/16/18   [provider]  DULoxetine (CYMBALTA) 30 MG capsule Take 30 mg by mouth daily. 11/05/21 11/05/22  [provider]  ezetimibe (ZETIA) 10 MG tablet Take 10 mg by mouth daily.    [provider]  fenofibrate (TRICOR) 145 MG tablet Take 145 mg  by mouth every evening. 03/13/21   [provider]  furosemide (LASIX) 20 MG tablet TAKE (1) TABLET BY MOUTH ONCE DAILY AS NEEDED. Patient taking differently: Take 20 mg by mouth daily as needed (leg swelling). 06/19/17   Lendon Colonel, NP  hydrocortisone (ANUSOL-HC) 2.5 % rectal cream Place 1 Application rectally 4 (four) times daily. Use four times per day x10 days then PRN thereafter 06/24/22   Carlan, Chelsea L, NP  JANUMET 50-1000 MG tablet Take 1 tablet by mouth 2 (two) times daily. 04/24/21   [provider]  loperamide (IMODIUM A-D) 2 MG tablet Take 2 mg by mouth 2 (two) times daily.    [provider]  MECLIZINE HCL PO Take by mouth. As needed    [provider]  montelukast (SINGULAIR) 10 MG tablet Take 10 mg by mouth at bedtime. 08/03/18   [provider]  moxifloxacin (AVELOX) 400 MG tablet Take 400 mg by mouth daily. 02/19/22   [provider]  Multiple Vitamin (MULTIVITAMIN) tablet Take 1 tablet by mouth in the morning.    [provider]  ondansetron (ZOFRAN) 4 MG tablet Take 1-2 tablets (4-8 mg total) by mouth every 8 (eight) hours as needed for nausea or vomiting. 03/11/18   Penumalli, Earlean Polka, MD  pantoprazole (PROTONIX) 40 MG tablet TAKE ONE TABLET BY MOUTH EVERY DAY 11/01/22  Carlan, Chelsea L, NP  pioglitazone (ACTOS) 15 MG tablet Take 15 mg by mouth daily. 02/25/22   [provider]  predniSONE (DELTASONE) 10 MG tablet Take 10 mg by mouth daily with breakfast. Take 39m every other day and 534mevery other day    [provider]  pregabalin (LYRICA) 75 MG capsule Take 75 mg by mouth every 8 (eight) hours. 01/24/22   [provider]  Probiotic Product (PROBIOTIC PO) Take 1 tablet by mouth daily.    [provider]  rifabutin (MYCOBUTIN) 150 MG capsule Take 300 mg by mouth daily. 02/19/22   [provider]  Turmeric 450 MG CAPS Take 450 mg by mouth at bedtime.    [provider]  Vitamin D, Ergocalciferol, (DRISDOL) 1.25 MG (50000 UNIT) CAPS capsule Take 50,000 Units by mouth See admin instructions. Every three weeks    [provider]      Allergies    Sulfa antibiotics, Caffeine, and Codeine    Review of Systems   Review of Systems  Gastrointestinal:  Positive for abdominal pain.    Physical Exam Updated Vital Signs Temp 98.6 F (37 C) (Oral)   Ht 5' 5"$  (1.651 m)   Wt 79.4 kg   BMI 29.12 kg/m  Physical Exam Vitals and nursing note reviewed.  HENT:     Head:     Comments: Patient appears uncomfortable Pulmonary:     Breath sounds: No wheezing or rhonchi.  Abdominal:     Comments: Right lower quadrant tenderness.  No hernia palpated.  No mass.  Skin:    General: Skin is warm.  Neurological:     Mental Status: She is alert.     ED Results / Procedures / Treatments   Labs (all labs ordered are listed, but only abnormal results are displayed) Labs Reviewed  COMPREHENSIVE METABOLIC PANEL  CBC WITH DIFFERENTIAL/PLATELET    EKG None  Radiology No results found.  Procedures Procedures  {Document cardiac monitor, telemetry assessment procedure when appropriate:1}  Medications Ordered in ED Medications  sodium chloride 0.9 % bolus 500 mL (500 mLs Intravenous New Bag/Given 11/27/22 0844)  HYDROmorphone (DILAUDID) injection 1 mg (1 mg Intravenous Given 11/27/22 0844)  ondansetron (ZOFRAN) injection 4 mg (4 mg Intravenous Given 11/27/22 [id number]   ED Course/ Medical Decision Making/ A&P   {   Click here for ABCD2, HEART and other calculatorsREFRESH Note before signing :1}                          Medical Decision Making Amount and/or Complexity of Data Reviewed Labs: ordered. Radiology: ordered.  Risk Prescription drug management.   Patient with right lower quad abdominal pain.  Also GI bleeding.  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  Also [facility]  On antibiotics currently.  States she has had some fevers also.  Has been feeling bad for a week.  Differential diagnosis is long but does include problems such as mesenteric ischemia with her previous history of clots and also infection.  Will get basic blood work.  Will get CT scan with angiography.  I reviewed notes from [facility].   {Document critical care time when appropriate:1} {Document review of labs and clinical decision tools ie heart score, Chads2Vasc2 etc:1}  {Document your independent review of radiology images, and any outside records:1} {Document your discussion with family members,  caretakers, and with consultants:1} {Document social determinants of health affecting pt's care:1} {Document your decision making why or why not admission, treatments were needed:1} Final Clinical Impression(s) / ED Diagnoses Final diagnoses:  None    Rx / DC Orders ED Discharge Orders     None

## 2022-11-27 NOTE — ED Notes (Signed)
Patient transported to CT 

## 2022-11-27 NOTE — ED Triage Notes (Addendum)
Pt states she started with sharp right lower quadrant pain & n/v/d last night @ 2200. Rates pain 10/10 in RLQ   Pt also states she has dark blood in her stools x1 week

## 2022-11-27 NOTE — Discharge Instructions (Addendum)
Call Dr. Noland Fordyce office today.  Watch for fevers.  Watch for uncontrolled pain.  There potentially could be some bleeding from your GI tract also.  Your hemoglobin is reassuring however.  Follow-up with your doctors.

## 2022-11-28 LAB — URINE CULTURE: Culture: <10000 — AB

## 2022-11-29 ENCOUNTER — Other Ambulatory Visit: Payer: Self-pay

## 2022-11-29 ENCOUNTER — Encounter: Payer: Self-pay | Admitting: Urology

## 2022-11-29 ENCOUNTER — Ambulatory Visit: Payer: Medicare HMO | Admitting: Urology

## 2022-11-29 VITALS — BP 116/73 | HR 74

## 2022-11-29 DIAGNOSIS — N200 Calculus of kidney: Secondary | ICD-10-CM | POA: Diagnosis not present

## 2022-11-29 MED ORDER — ALFUZOSIN HCL ER 10 MG PO TB24: 10.0000 mg | ORAL_TABLET | Freq: Every evening | ORAL | 0 refills | Status: DC

## 2022-11-29 MED ORDER — ONDANSETRON 4 MG PO TBDP: 4.0000 mg | ORAL_TABLET | Freq: Three times a day (TID) | ORAL | 0 refills | Status: DC | PRN

## 2022-11-29 MED ORDER — HYDROCODONE-ACETAMINOPHEN 5-325 MG PO TABS: 1.0000 | ORAL_TABLET | Freq: Four times a day (QID) | ORAL | 0 refills | Status: DC | PRN

## 2022-11-29 NOTE — Progress Notes (Signed)
I spoke with Ms. Joy Leach. We have discussed possible surgery dates and 12/03/2022 was agreed upon by all parties. Patient given information about surgery date, what to expect pre-operatively and post operatively.    We discussed that a pre-op nurse will be calling to set up the pre-op visit that will take place prior to surgery. Informed patient that our office will communicate any additional care to be provided after surgery.    Patients questions or concerns were discussed during our call. Advised to call our office should there be any additional information, questions or concerns that arise. Patient verbalized understanding.

## 2022-11-29 NOTE — H&P (View-Only) (Signed)
11/29/2022 11:33 AM   [name] 09/10/1956 [id number]  Referring provider: Georgann Housekeeper, [place] [address]  Nephrolithiasis   HPI: Ms Goffin is a 67yo here for evaluation of nephrolithiasis. She developed right flank pain Tuesday and went to the ER on Wednesday and underwent CT which showed multiple 70m distal ureteral calculi. KUB from today redemonstrates the right distal calculus. She has nausea currently but no pain currently. This is her first stone event. KUB from 2/14 shows right distal ureteral calculsu   PMH: Past Medical History:  Diagnosis Date   CHF (congestive heart failure) (HCC)    Chronic kidney disease    Diabetes mellitus without complication ([place]    Headache    Hyperlipidemia    Hypertension    [place]us ([place]    Mycobacterial disease    [place]ca ([place]     Surgical History: Past Surgical History:  Procedure Laterality Date   ABDOMINAL HYSTERECTOMY     BIOPSY  10/26/2021   Procedure: BIOPSY;  Surgeon: CHarvel Quale MD;  [facility];  Service: Gastroenterology;;   BIOPSY  04/02/2022   Procedure: BIOPSY;  Surgeon: CHarvel Quale MD;  [facility];  Service: Gastroenterology;;   COLONOSCOPY  05/26/2015   Dr bBritta Mccreedy mild diverticulosis in sigmoid colon, [facility]   COLONOSCOPY WITH PROPOFOL N/A 04/02/2022   Procedure: COLONOSCOPY WITH PROPOFOL;  Surgeon: CHarvel Quale MD;  [facility];  Service: Gastroenterology;  Laterality: N/A;  815   ESOPHAGOGASTRODUODENOSCOPY (EGD) WITH PROPOFOL N/A 10/26/2021   Procedure: ESOPHAGOGASTRODUODENOSCOPY (EGD) WITH PROPOFOL;  Surgeon: CHarvel Quale MD;  [facility];  Service: Gastroenterology;  Laterality: N/A;  1ElmoPLACEMENT  10/26/2021   Procedure: HEMOSTASIS CLIP PLACEMENT;  Surgeon: CHarvel Quale MD;  [facility];  Service: Gastroenterology;;   HOT HEMOSTASIS  10/26/2021   Procedure: HOT HEMOSTASIS (ARGON PLASMA COAGULATION/BICAP);  Surgeon: CMontez Morita DQuillian Quince MD;  [facility];  Service: Gastroenterology;;   hystorectomy     POLYPECTOMY  10/26/2021   Procedure: POLYPECTOMY;  Surgeon: CHarvel Quale MD;  [facility];  Service: Gastroenterology;;   POLYPECTOMY  04/02/2022   Procedure: POLYPECTOMY;  Surgeon: CHarvel Quale MD;  [facility];  Service: Gastroenterology;;   SINUS SURGERY WITH INSTATRAK  2015    Home Medications:  Allergies as of 11/29/2022       Reactions   Sulfa Antibiotics Hives   Caffeine Other (See Comments)   Bladder infections   Codeine Nausea And Vomiting        Medication List        Accurate as of November 29, 2022 11:33 AM. If you have any questions, ask your nurse or doctor.          albuterol 108 (90 Base) MCG/ACT inhaler Commonly known as: VENTOLIN HFA Inhale 1 puff into the lungs every 6 (six) hours as needed for shortness of breath.   apixaban 2.5 MG Tabs tablet Commonly known as: ELIQUIS Take 5 mg by mouth 2 (two) times daily.   azithromycin 250 MG tablet Commonly known as: ZITHROMAX Take 250 mg by mouth daily.   cyanocobalamin 1000 MCG tablet Commonly known as: VITAMIN B12 Take 1,000 mcg by mouth daily.   diclofenac sodium 1 % Gel Commonly known as: VOLTAREN Apply 1 application topically 3 (three) times daily as needed (knee  pain.).   DULoxetine 30 MG capsule Commonly known as: CYMBALTA Take 30 mg by mouth daily.   ethambutol 400 MG tablet Commonly known as: MYAMBUTOL Take 1,200 mg by mouth daily.   ezetimibe 10 MG tablet Commonly known as: ZETIA Take 10 mg by mouth daily.   fenofibrate 145 MG tablet Commonly known as: TRICOR Take 145 mg by mouth every evening.   ferrous sulfate 325 (65 FE) MG EC tablet Take 325 mg by mouth daily with breakfast.    furosemide 20 MG tablet Commonly known as: LASIX TAKE (1) TABLET BY MOUTH ONCE DAILY AS NEEDED. What changed: See the new instructions.   HYDROcodone-acetaminophen 5-325 MG tablet Commonly known as: NORCO/VICODIN Take 1-2 tablets by mouth every 6 (six) hours as needed.   hydrocortisone 2.5 % rectal cream Commonly known as: ANUSOL-HC Place 1 Application rectally 4 (four) times daily. Use four times per day x10 days then PRN thereafter   imiquimod 5 % cream Commonly known as: ALDARA Apply 1 Application topically 3 (three) times a week.   Janumet 50-1000 MG tablet Generic drug: sitaGLIPtin-metformin Take 1 tablet by mouth 2 (two) times daily.   linezolid 600 MG tablet Commonly known as: ZYVOX Take 600 mg by mouth 2 (two) times daily.   loperamide 2 MG tablet Commonly known as: IMODIUM A-D Take 2 mg by mouth 2 (two) times daily.   meclizine 25 MG tablet Commonly known as: ANTIVERT Take 25 mg by mouth 3 (three) times daily as needed for dizziness or nausea.   montelukast 10 MG tablet Commonly known as: SINGULAIR Take 10 mg by mouth at bedtime.   multivitamin tablet Take 1 tablet by mouth in the morning.   ondansetron 4 MG disintegrating tablet Commonly known as: ZOFRAN-ODT Take 1 tablet (4 mg total) by mouth every 8 (eight) hours as needed for nausea or vomiting.   pantoprazole 40 MG tablet Commonly known as: PROTONIX TAKE ONE TABLET BY MOUTH EVERY DAY   pioglitazone 15 MG tablet Commonly known as: ACTOS Take 15 mg by mouth daily.   predniSONE 10 MG tablet Commonly known as: DELTASONE Take 10 mg by mouth daily with breakfast. Take 59m every other day and 598mevery other day   pregabalin 75 MG capsule Commonly known as: LYRICA Take 75 mg by mouth every 8 (eight) hours.   Turmeric 450 MG Caps Take 450 mg by mouth at bedtime.        Allergies:  Allergies  Allergen Reactions   Sulfa Antibiotics Hives   Caffeine Other (See Comments)    Bladder  infections   Codeine Nausea And Vomiting    Family History: Family History  Problem Relation Age of Onset   Hypertension Mother    Diabetes Mother    Kidney disease Mother    Heart disease Mother    High Cholesterol Mother    Lung cancer Mother    Hypertension Father    Diabetes Father    Kidney disease Father    Heart disease Father     Social History:  reports that she has never smoked. She has never been exposed to tobacco smoke. She has never used smokeless tobacco. She reports that she does not drink alcohol and does not use drugs.  ROS: All other review of systems were reviewed and are negative except what is noted above in HPI  Physical Exam: BP 116/73   Pulse 74   Constitutional:  Alert and oriented, No acute distress. HEENT: [place] AT, moist mucus membranes.  Trachea midline, no masses. Cardiovascular: No clubbing, cyanosis, or edema. Respiratory: Normal respiratory effort, no increased work of breathing. GI: Abdomen is soft, nontender, nondistended, no abdominal masses GU: No CVA tenderness.  Lymph: No cervical or inguinal lymphadenopathy. Skin: No rashes, bruises or suspicious lesions. Neurologic: Grossly intact, no focal deficits, moving all 4 extremities. Psychiatric: Normal mood and affect.  Laboratory Data: Lab Results  Component Value Date   WBC 5.9 11/27/2022   HGB 11.7 (L) 11/27/2022   HCT 36.1 11/27/2022   MCV 87.0 11/27/2022   PLT 179 11/27/2022    Lab Results  Component Value Date   CREATININE 1.72 (H) 11/27/2022    No results found for: "PSA"  No results found for: "TESTOSTERONE"  No results found for: "HGBA1C"  Urinalysis    Component Value Date/Time   COLORURINE YELLOW 11/27/2022 1010   APPEARANCEUR HAZY (A) 11/27/2022 1010   LABSPEC 1.015 11/27/2022 1010   PHURINE 8.0 11/27/2022 1010   GLUCOSEU NEGATIVE 11/27/2022 1010   HGBUR LARGE (A) 11/27/2022 1010   BILIRUBINUR NEGATIVE 11/27/2022 1010   KETONESUR NEGATIVE 11/27/2022 1010    PROTEINUR 30 (A) 11/27/2022 1010   NITRITE NEGATIVE 11/27/2022 1010   LEUKOCYTESUR TRACE (A) 11/27/2022 1010    Lab Results  Component Value Date   BACTERIA RARE (A) 11/27/2022    Pertinent Imaging: CT and KUB 11/27/2022: Images reviewed and discussed with the patient Results for orders placed during the hospital encounter of 11/27/22  DG Abdomen 1 View  Narrative CLINICAL DATA:  Nephrolithiasis.  EXAM: ABDOMEN - 1 VIEW  COMPARISON:  CT scan of same day.  FINDINGS: The bowel gas pattern is normal. Residual contrast is noted in moderately dilated right intrarenal and extrarenal collecting system as well as right ureter. Several calcifications are noted in the distal right ureter as noted on prior CT scan. Contrast filling of urinary bladder is noted as well.  IMPRESSION: Several distal right ureteral calculi are noted resulting in moderate right hydroureteronephrosis.   Electronically Signed By: Marijo Conception M.D. On: 11/27/2022 12:47  No results found for this or any previous visit.  No results found for this or any previous visit.  No results found for this or any previous visit.  No results found for this or any previous visit.  No valid procedures specified. No results found for this or any previous visit.  No results found for this or any previous visit.   Assessment & Plan:    1. Kidney stones -We discussed the management of kidney stones. These options include observation, ureteroscopy, shockwave lithotripsy (ESWL) and percutaneous nephrolithotomy (PCNL). We discussed which options are relevant to the patient's stone(s). We discussed the natural history of kidney stones as well as the complications of untreated stones and the impact on quality of life without treatment as well as with each of the above listed treatments. We also discussed the efficacy of each treatment in its ability to clear the stone burden. With any of these management options I  discussed the signs and symptoms of infection and the need for emergent treatment should these be experienced. For each option we discussed the ability of each procedure to clear the patient of their stone burden.   For observation I described the risks which include but are not limited to silent renal damage, life-threatening infection, need for emergent surgery, failure to pass stone and pain.   For ureteroscopy I described the risks which include bleeding, infection, damage to contiguous structures, positioning injury, ureteral  stricture, ureteral avulsion, ureteral injury, need for prolonged ureteral stent, inability to perform ureteroscopy, need for an interval procedure, inability to clear stone burden, stent discomfort/pain, heart attack, stroke, pulmonary embolus and the inherent risks with general anesthesia.   For shockwave lithotripsy I described the risks which include arrhythmia, kidney contusion, kidney hemorrhage, need for transfusion, pain, inability to adequately break up stone, inability to pass stone fragments, Steinstrasse, infection associated with obstructing stones, need for alternate surgical procedure, need for repeat shockwave lithotripsy, MI, CVA, PE and the inherent risks with anesthesia/conscious sedation.   For PCNL I described the risks including positioning injury, pneumothorax, hydrothorax, need for chest tube, inability to clear stone burden, renal laceration, arterial venous fistula or malformation, need for embolization of kidney, loss of kidney or renal function, need for repeat procedure, need for prolonged nephrostomy tube, ureteral avulsion, MI, CVA, PE and the inherent risks of general anesthesia.   - The patient would like to proceed with right ESWL - Urinalysis, Routine w reflex microscopic   No follow-ups on file.  [name], MD  [facility] [place]

## 2022-11-29 NOTE — Progress Notes (Signed)
11/29/2022 11:33 AM   Joy Leach 1956-06-05 [id number]  Referring provider: Georgann Housekeeper, [place] [address]  Nephrolithiasis   HPI: Ms Heuton is a 67yo here for evaluation of nephrolithiasis. She developed right flank pain Tuesday and went to the ER on Wednesday and underwent CT which showed multiple 63m distal ureteral calculi. KUB from today redemonstrates the right distal calculus. She has nausea currently but no pain currently. This is her first stone event. KUB from 2/14 shows right distal ureteral calculsu   PMH: Past Medical History:  Diagnosis Date   CHF (congestive heart failure) (HCC)    Chronic kidney disease    Diabetes mellitus without complication ([place]    Headache    Hyperlipidemia    Hypertension    [place]upus ([place]    Mycobacterial disease    [place] ([place]     Surgical History: Past Surgical History:  Procedure Laterality Date   ABDOMINAL HYSTERECTOMY     BIOPSY  10/26/2021   Procedure: BIOPSY;  Surgeon: CHarvel Quale MD;  [facility];  Service: Gastroenterology;;   BIOPSY  04/02/2022   Procedure: BIOPSY;  Surgeon: CHarvel Quale MD;  [facility];  Service: Gastroenterology;;   COLONOSCOPY  05/26/2015   Dr bBritta Mccreedy mild diverticulosis in sigmoid colon[facility]   COLONOSCOPY WITH PROPOFOL N/A 04/02/2022   Procedure: COLONOSCOPY WITH PROPOFOL;  Surgeon: CHarvel Quale MD;  [facility];  Service: Gastroenterology;  Laterality: N/A;  815   ESOPHAGOGASTRODUODENOSCOPY (EGD) WITH PROPOFOL N/A 10/26/2021   Procedure: ESOPHAGOGASTRODUODENOSCOPY (EGD) WITH PROPOFOL;  Surgeon: CHarvel Quale MD;  [facility];  Service: Gastroenterology;  Laterality: N/A;  1LookoutPLACEMENT  10/26/2021   Procedure: HEMOSTASIS CLIP PLACEMENT;  Surgeon: CHarvel Quale MD;  [facility];  Service: Gastroenterology;;   HOT HEMOSTASIS  10/26/2021   Procedure: HOT HEMOSTASIS (ARGON PLASMA COAGULATION/BICAP);  Surgeon: CMontez Morita DQuillian Quince MD;  [facility];  Service: Gastroenterology;;   hystorectomy     POLYPECTOMY  10/26/2021   Procedure: POLYPECTOMY;  Surgeon: CHarvel Quale MD;  [facility];  Service: Gastroenterology;;   POLYPECTOMY  04/02/2022   Procedure: POLYPECTOMY;  Surgeon: CHarvel Quale MD;  [facility];  Service: Gastroenterology;;   SINUS SURGERY WITH INSTATRAK  2015    Home Medications:  Allergies as of 11/29/2022       Reactions   Sulfa Antibiotics Hives   Caffeine Other (See Comments)   Bladder infections   Codeine Nausea And Vomiting        Medication List        Accurate as of November 29, 2022 11:33 AM. If you have any questions, ask your nurse or doctor.          albuterol 108 (90 Base) MCG/ACT inhaler Commonly known as: VENTOLIN HFA Inhale 1 puff into the lungs every 6 (six) hours as needed for shortness of breath.   apixaban 2.5 MG Tabs tablet Commonly known as: ELIQUIS Take 5 mg by mouth 2 (two) times daily.   azithromycin 250 MG tablet Commonly known as: ZITHROMAX Take 250 mg by mouth daily.   cyanocobalamin 1000 MCG tablet Commonly known as: VITAMIN B12 Take 1,000 mcg by mouth daily.   diclofenac sodium 1 % Gel Commonly known as: VOLTAREN Apply 1 application topically 3 (three) times daily as needed (knee  pain.).   DULoxetine 30 MG capsule Commonly known as: CYMBALTA Take 30 mg by mouth daily.   ethambutol 400 MG tablet Commonly known as: MYAMBUTOL Take 1,200 mg by mouth daily.   ezetimibe 10 MG tablet Commonly known as: ZETIA Take 10 mg by mouth daily.   fenofibrate 145 MG tablet Commonly known as: TRICOR Take 145 mg by mouth every evening.   ferrous sulfate 325 (65 FE) MG EC tablet Take 325 mg by mouth daily with breakfast.    furosemide 20 MG tablet Commonly known as: LASIX TAKE (1) TABLET BY MOUTH ONCE DAILY AS NEEDED. What changed: See the new instructions.   HYDROcodone-acetaminophen 5-325 MG tablet Commonly known as: NORCO/VICODIN Take 1-2 tablets by mouth every 6 (six) hours as needed.   hydrocortisone 2.5 % rectal cream Commonly known as: ANUSOL-HC Place 1 Application rectally 4 (four) times daily. Use four times per day x10 days then PRN thereafter   imiquimod 5 % cream Commonly known as: ALDARA Apply 1 Application topically 3 (three) times a week.   Janumet 50-1000 MG tablet Generic drug: sitaGLIPtin-metformin Take 1 tablet by mouth 2 (two) times daily.   linezolid 600 MG tablet Commonly known as: ZYVOX Take 600 mg by mouth 2 (two) times daily.   loperamide 2 MG tablet Commonly known as: IMODIUM A-D Take 2 mg by mouth 2 (two) times daily.   meclizine 25 MG tablet Commonly known as: ANTIVERT Take 25 mg by mouth 3 (three) times daily as needed for dizziness or nausea.   montelukast 10 MG tablet Commonly known as: SINGULAIR Take 10 mg by mouth at bedtime.   multivitamin tablet Take 1 tablet by mouth in the morning.   ondansetron 4 MG disintegrating tablet Commonly known as: ZOFRAN-ODT Take 1 tablet (4 mg total) by mouth every 8 (eight) hours as needed for nausea or vomiting.   pantoprazole 40 MG tablet Commonly known as: PROTONIX TAKE ONE TABLET BY MOUTH EVERY DAY   pioglitazone 15 MG tablet Commonly known as: ACTOS Take 15 mg by mouth daily.   predniSONE 10 MG tablet Commonly known as: DELTASONE Take 10 mg by mouth daily with breakfast. Take 82m every other day and 533mevery other day   pregabalin 75 MG capsule Commonly known as: LYRICA Take 75 mg by mouth every 8 (eight) hours.   Turmeric 450 MG Caps Take 450 mg by mouth at bedtime.        Allergies:  Allergies  Allergen Reactions   Sulfa Antibiotics Hives   Caffeine Other (See Comments)    Bladder  infections   Codeine Nausea And Vomiting    Family History: Family History  Problem Relation Age of Onset   Hypertension Mother    Diabetes Mother    Kidney disease Mother    Heart disease Mother    High Cholesterol Mother    Lung cancer Mother    Hypertension Father    Diabetes Father    Kidney disease Father    Heart disease Father     Social History:  reports that she has never smoked. She has never been exposed to tobacco smoke. She has never used smokeless tobacco. She reports that she does not drink alcohol and does not use drugs.  ROS: All other review of systems were reviewed and are negative except what is noted above in HPI  Physical Exam: BP 116/73   Pulse 74   Constitutional:  Alert and oriented, No acute distress. HEENT: [place] AT, moist mucus membranes.  Trachea midline, no masses. Cardiovascular: No clubbing, cyanosis, or edema. Respiratory: Normal respiratory effort, no increased work of breathing. GI: Abdomen is soft, nontender, nondistended, no abdominal masses GU: No CVA tenderness.  Lymph: No cervical or inguinal lymphadenopathy. Skin: No rashes, bruises or suspicious lesions. Neurologic: Grossly intact, no focal deficits, moving all 4 extremities. Psychiatric: Normal mood and affect.  Laboratory Data: Lab Results  Component Value Date   WBC 5.9 11/27/2022   HGB 11.7 (L) 11/27/2022   HCT 36.1 11/27/2022   MCV 87.0 11/27/2022   PLT 179 11/27/2022    Lab Results  Component Value Date   CREATININE 1.72 (H) 11/27/2022    No results found for: "PSA"  No results found for: "TESTOSTERONE"  No results found for: "HGBA1C"  Urinalysis    Component Value Date/Time   COLORURINE YELLOW 11/27/2022 1010   APPEARANCEUR HAZY (A) 11/27/2022 1010   LABSPEC 1.015 11/27/2022 1010   PHURINE 8.0 11/27/2022 1010   GLUCOSEU NEGATIVE 11/27/2022 1010   HGBUR LARGE (A) 11/27/2022 1010   BILIRUBINUR NEGATIVE 11/27/2022 1010   KETONESUR NEGATIVE 11/27/2022 1010    PROTEINUR 30 (A) 11/27/2022 1010   NITRITE NEGATIVE 11/27/2022 1010   LEUKOCYTESUR TRACE (A) 11/27/2022 1010    Lab Results  Component Value Date   BACTERIA RARE (A) 11/27/2022    Pertinent Imaging: CT and KUB 11/27/2022: Images reviewed and discussed with the patient Results for orders placed during the hospital encounter of 11/27/22  DG Abdomen 1 View  Narrative CLINICAL DATA:  Nephrolithiasis.  EXAM: ABDOMEN - 1 VIEW  COMPARISON:  CT scan of same day.  FINDINGS: The bowel gas pattern is normal. Residual contrast is noted in moderately dilated right intrarenal and extrarenal collecting system as well as right ureter. Several calcifications are noted in the distal right ureter as noted on prior CT scan. Contrast filling of urinary bladder is noted as well.  IMPRESSION: Several distal right ureteral calculi are noted resulting in moderate right hydroureteronephrosis.   Electronically Signed By: Marijo Conception M.D. On: 11/27/2022 12:47  No results found for this or any previous visit.  No results found for this or any previous visit.  No results found for this or any previous visit.  No results found for this or any previous visit.  No valid procedures specified. No results found for this or any previous visit.  No results found for this or any previous visit.   Assessment & Plan:    1. Kidney stones -We discussed the management of kidney stones. These options include observation, ureteroscopy, shockwave lithotripsy (ESWL) and percutaneous nephrolithotomy (PCNL). We discussed which options are relevant to the patient's stone(s). We discussed the natural history of kidney stones as well as the complications of untreated stones and the impact on quality of life without treatment as well as with each of the above listed treatments. We also discussed the efficacy of each treatment in its ability to clear the stone burden. With any of these management options I  discussed the signs and symptoms of infection and the need for emergent treatment should these be experienced. For each option we discussed the ability of each procedure to clear the patient of their stone burden.   For observation I described the risks which include but are not limited to silent renal damage, life-threatening infection, need for emergent surgery, failure to pass stone and pain.   For ureteroscopy I described the risks which include bleeding, infection, damage to contiguous structures, positioning injury, ureteral  stricture, ureteral avulsion, ureteral injury, need for prolonged ureteral stent, inability to perform ureteroscopy, need for an interval procedure, inability to clear stone burden, stent discomfort/pain, heart attack, stroke, pulmonary embolus and the inherent risks with general anesthesia.   For shockwave lithotripsy I described the risks which include arrhythmia, kidney contusion, kidney hemorrhage, need for transfusion, pain, inability to adequately break up stone, inability to pass stone fragments, Steinstrasse, infection associated with obstructing stones, need for alternate surgical procedure, need for repeat shockwave lithotripsy, MI, CVA, PE and the inherent risks with anesthesia/conscious sedation.   For PCNL I described the risks including positioning injury, pneumothorax, hydrothorax, need for chest tube, inability to clear stone burden, renal laceration, arterial venous fistula or malformation, need for embolization of kidney, loss of kidney or renal function, need for repeat procedure, need for prolonged nephrostomy tube, ureteral avulsion, MI, CVA, PE and the inherent risks of general anesthesia.   - The patient would like to proceed with right ESWL - Urinalysis, Routine w reflex microscopic   No follow-ups on file.  [name], MD  [facility] [place]

## 2022-11-29 NOTE — Patient Instructions (Addendum)
Dear Joy Leach,   Thank you for choosing [facility] [place] to assist in your urologic care for your upcoming surgery. The following information below includes specific dates and details related to surgery:   The Surgical Procedure you are scheduled to have performed is right extracorporeal shock wave lithotripsy   Surgery Date: 12/03/2022   Physician performing the surgery: Dr. [name]   Do not eat or drink after midnight the day before your surgery.    You will need a driver the day of surgery and will not be able to operate heavy machinery for 24 hours after.    Your surgery will be performed at  [facility] [address], [place] [address]   Enter at the Main Entrance and check in at the [place] desk.     Pre-Admit Testing Info   Pre- Admit appointments are interview with an anesthesiologist or a pre-operative anesthesia nurse. These appointments are typically completed as an in person visit but can take place over the telephone.  You will be contacted to confirm the date and time window.    If you have any questions or concerns, please don't hesitate to call the office at [phone]   Thank you,    Gibson Ramp, RN Clinical Surgery Coordinator [facility]    Dietary Guidelines to Help Prevent Kidney Stones Kidney stones are deposits of minerals and salts that form inside your kidneys. Your risk of developing kidney stones may be greater depending on your diet, your lifestyle, the medicines you take, and whether you have certain medical conditions. Most people can lower their risks of developing kidney stones by following these dietary guidelines. Your dietitian may give you more specific instructions depending on your overall health and the type of kidney stones you tend to develop. What are tips for following this plan? Reading food labels  Choose foods with "no salt added" or "low-salt" labels. Limit your  salt (sodium) intake to less than 1,500 mg a day. Choose foods with calcium for each meal and snack. Try to eat about 300 mg of calcium at each meal. Foods that contain 200-500 mg of calcium a serving include: 8 oz (237 mL) of milk, calcium-fortifiednon-dairy milk, and calcium-fortifiedfruit juice. Calcium-fortified means that calcium has been added to these drinks. 8 oz (237 mL) of kefir, yogurt, and soy yogurt. 4 oz (114 g) of tofu. 1 oz (28 g) of cheese. 1 cup (150 g) of dried figs. 1 cup (91 g) of cooked broccoli. One 3 oz (85 g) can of sardines or mackerel. Most people need 1,000-1,500 mg of calcium a day. Talk to your dietitian about how much calcium is recommended for you. Shopping Buy plenty of fresh fruits and vegetables. Most people do not need to avoid fruits and vegetables, even if these foods contain nutrients that may contribute to kidney stones. When shopping for convenience foods, choose: Whole pieces of fruit. Pre-made salads with dressing on the side. Low-fat fruit and yogurt smoothies. Avoid buying frozen meals or prepared deli foods. These can be high in sodium. Look for foods with live cultures, such as yogurt and kefir. Choose high-fiber grains, such as whole-wheat breads, oat bran, and wheat cereals. Cooking Do not add salt to food when cooking. Place a salt shaker on the table and allow each person to add their own salt to taste. Use vegetable protein, such as beans, textured vegetable protein (TVP), or tofu, instead of meat in pasta,  casseroles, and soups. Meal planning Eat less salt, if told by your dietitian. To do this: Avoid eating processed or pre-made food. Avoid eating fast food. Eat less animal protein, including cheese, meat, poultry, or fish, if told by your dietitian. To do this: Limit the number of times you have meat, poultry, fish, or cheese each week. Eat a diet free of meat at least 2 days a week. Eat only one serving each day of meat, poultry,  fish, or seafood. When you prepare animal proteins, cut pieces into small portion sizes. For most meat and fish, one serving is about the size of the palm of your hand. Eat at least five servings of fresh fruits and vegetables each day. To do this: Keep fruits and vegetables on hand for snacks. Eat one piece of fruit or a handful of berries with breakfast. Have a salad and fruit at lunch. Have two kinds of vegetables at dinner. You may be told to limit foods that are high in a substance called oxalate. These include: Spinach (cooked), rhubarb, beets, sweet potatoes, and Swiss chard. Peanuts. Potato chips, french fries, and baked potatoes with skin on. Nuts and nut products. Chocolate. If you regularly take a diuretic medicine, make sure to eat at least 1 or 2 servings of fruits or vegetables that are high in potassium each day. These include: Avocado. Banana. Orange, prune, carrot, or tomato juice. Baked potato. Cabbage. Beans and split peas. Lifestyle  Drink enough fluid to keep your urine pale yellow. This is the most important thing you can do. Spread your fluid intake throughout the day. If you drink alcohol: Limit how much you have to: 0-1 drink a day for women who are not pregnant. 0-2 drinks a day for men. Know how much alcohol is in your drink. In the [place] one drink equals one 12 oz bottle of beer (355 mL), one 5 oz glass of wine (148 mL), or one 1 oz glass of hard liquor (44 mL). Lose weight if told by your health care provider. Work with your dietitian to find an eating plan and weight loss strategies that work best for you. General information Talk to your health care provider and dietitian about taking daily supplements. Depending on your health and the cause of your kidney stones, you may be told: Do not take high-dose supplements of vitamin C (1,000 mg a day or more). To take a calcium supplement. To take a daily probiotic supplement. To take other supplements such  as magnesium, fish oil, or vitamin B6. Take over-the-counter and prescription medicines only as told by your health care provider. These include supplements. What foods should I limit? Limit your intake of the following foods, or eat them as told by your dietitian. Vegetables Spinach. Rhubarb. Beets. Canned vegetables. Angie Fava. Olives. Baked potatoes with skin. Grains Wheat bran. Baked goods. Salted crackers. Cereals high in sugar. Meats and other proteins Nuts. Nut butters. Large portions of meat, poultry, or fish. Salted, precooked, or cured meats, such as sausages, meat loaves, and hot dogs. Dairy Cheeses. Beverages Regular soft drinks. Regular vegetable juice. Seasonings and condiments Seasoning blends with salt. Salad dressings. Soy sauce. Ketchup. Barbecue sauce. Other foods Canned soups. Canned pasta sauce. Casseroles. Pizza. Lasagna. Frozen meals. Potato chips. [place] fries. The items listed above may not be a complete list of foods and beverages you should limit. Contact a dietitian for more information. What foods should I avoid? Talk to your dietitian about specific foods you should avoid based on the  type of kidney stones you have and your overall health. Fruits Grapefruit. The item listed above may not be a complete list of foods and beverages you should avoid. Contact a dietitian for more information. Summary Kidney stones are deposits of minerals and salts that form inside your kidneys. You can lower your risk of kidney stones by making changes to your diet. The most important thing you can do is drink enough fluid. Drink enough fluid to keep your urine pale yellow. Talk to your dietitian about how much calcium you should have each day, and eat less salt and animal protein as told by your dietitian. This information is not intended to replace advice given to you by your health care provider. Make sure you discuss any questions you have with your health care  provider. Document Revised: 01/10/2022 Document Reviewed: 01/10/2022 Elsevier Patient Education  [place].

## 2022-12-02 ENCOUNTER — Encounter (HOSPITAL_COMMUNITY): Payer: Self-pay

## 2022-12-02 ENCOUNTER — Encounter (HOSPITAL_COMMUNITY)
Admission: RE | Admit: 2022-12-02 | Discharge: 2022-12-02 | Disposition: A | Discharge status: Home / Self Care | Payer: Medicare HMO | Source: Ambulatory Visit | Attending: Urology | Admitting: Urology

## 2022-12-02 HISTORY — DX: Personal history of other venous thrombosis and embolism: Z86.718

## 2022-12-03 ENCOUNTER — Ambulatory Visit (HOSPITAL_COMMUNITY): Payer: Medicare HMO

## 2022-12-03 ENCOUNTER — Encounter (HOSPITAL_COMMUNITY): Payer: Self-pay | Admitting: Urology

## 2022-12-03 ENCOUNTER — Encounter (HOSPITAL_COMMUNITY)
Admission: RE | Disposition: A | Discharge status: Home / Self Care | Payer: Self-pay | Source: Ambulatory Visit | Attending: Urology

## 2022-12-03 ENCOUNTER — Ambulatory Visit (HOSPITAL_COMMUNITY)
Admission: RE | Admit: 2022-12-03 | Discharge: 2022-12-03 | Disposition: A | Discharge status: Home / Self Care | Payer: Medicare HMO | Source: Ambulatory Visit | Attending: Urology | Admitting: Urology

## 2022-12-03 DIAGNOSIS — E1122 Type 2 diabetes mellitus with diabetic chronic kidney disease: Secondary | ICD-10-CM | POA: Diagnosis not present

## 2022-12-03 DIAGNOSIS — I130 Hypertensive heart and chronic kidney disease with heart failure and stage 1 through stage 4 chronic kidney disease, or unspecified chronic kidney disease: Secondary | ICD-10-CM | POA: Insufficient documentation

## 2022-12-03 DIAGNOSIS — Z7901 Long term (current) use of anticoagulants: Secondary | ICD-10-CM | POA: Diagnosis not present

## 2022-12-03 DIAGNOSIS — N201 Calculus of ureter: Secondary | ICD-10-CM

## 2022-12-03 DIAGNOSIS — I509 Heart failure, unspecified: Secondary | ICD-10-CM | POA: Insufficient documentation

## 2022-12-03 DIAGNOSIS — Z6829 Body mass index (BMI) 29.0-29.9, adult: Secondary | ICD-10-CM | POA: Insufficient documentation

## 2022-12-03 DIAGNOSIS — N189 Chronic kidney disease, unspecified: Secondary | ICD-10-CM | POA: Insufficient documentation

## 2022-12-03 HISTORY — PX: EXTRACORPOREAL SHOCK WAVE LITHOTRIPSY: SHX1557

## 2022-12-03 LAB — GLUCOSE, CAPILLARY
Glucose-Capillary: 64 mg/dL — ABNORMAL LOW (ref 70–99)
Glucose-Capillary: 84 mg/dL (ref 70–99)
Glucose-Capillary: 59 mg/dL — ABNORMAL LOW (ref 70–99)
Glucose-Capillary: 123 mg/dL — ABNORMAL HIGH (ref 70–99)

## 2022-12-03 SURGERY — LITHOTRIPSY, ESWL
Anesthesia: LOCAL | Laterality: Right

## 2022-12-03 MED ORDER — DIAZEPAM 5 MG PO TABS
10.0000 mg | ORAL_TABLET | Freq: Once | ORAL | Status: AC
  Administered 2022-12-03: 10 mg via ORAL
  Filled 2022-12-03: qty 2

## 2022-12-03 MED ORDER — HYDROCODONE-ACETAMINOPHEN 5-325 MG PO TABS: 1.0000 | ORAL_TABLET | Freq: Four times a day (QID) | ORAL | 0 refills | Status: DC | PRN

## 2022-12-03 MED ORDER — DEXTROSE 50 % IV SOLN: 12.5000 g | INTRAVENOUS | Status: AC

## 2022-12-03 MED ORDER — DIPHENHYDRAMINE HCL 25 MG PO CAPS
25.0000 mg | ORAL_CAPSULE | ORAL | Status: AC
  Administered 2022-12-03: 25 mg via ORAL
  Filled 2022-12-03: qty 1

## 2022-12-03 MED ORDER — DEXTROSE 50 % IV SOLN
INTRAVENOUS | Status: AC
  Administered 2022-12-03: 12.5 g via INTRAVENOUS
  Filled 2022-12-03: qty 50

## 2022-12-03 MED ORDER — DEXTROSE 50 % IV SOLN
INTRAVENOUS | Status: AC
  Administered 2022-12-03: 50 mL
  Filled 2022-12-03: qty 50

## 2022-12-03 MED ORDER — ONDANSETRON 4 MG PO TBDP: 4.0000 mg | ORAL_TABLET | Freq: Three times a day (TID) | ORAL | 0 refills | Status: DC | PRN

## 2022-12-03 NOTE — Interval H&P Note (Signed)
History and Physical Interval Note:  12/03/2022 8:30 AM  Joy Leach  has presented today for surgery, with the diagnosis of right ureteral calculus.  The various methods of treatment have been discussed with the patient and family. After consideration of risks, benefits and other options for treatment, the patient has consented to  Procedure(s): EXTRACORPOREAL SHOCK WAVE LITHOTRIPSY (ESWL) (Right) as a surgical intervention.  The patient's history has been reviewed, patient examined, no change in status, stable for surgery.  I have reviewed the patient's chart and labs.  Questions were answered to the patient's satisfaction.     [name]

## 2022-12-05 ENCOUNTER — Encounter (HOSPITAL_COMMUNITY): Payer: Self-pay | Admitting: Urology

## 2022-12-20 ENCOUNTER — Ambulatory Visit (HOSPITAL_COMMUNITY)
Admission: RE | Admit: 2022-12-20 | Discharge: 2022-12-20 | Disposition: A | Discharge status: Home / Self Care | Payer: Medicare HMO | Source: Ambulatory Visit | Attending: Urology | Admitting: Urology

## 2022-12-20 ENCOUNTER — Ambulatory Visit: Payer: Medicare HMO | Admitting: Urology

## 2022-12-20 VITALS — BP 119/73 | HR 89

## 2022-12-20 DIAGNOSIS — N200 Calculus of kidney: Secondary | ICD-10-CM | POA: Diagnosis present

## 2022-12-20 LAB — URINALYSIS, ROUTINE W REFLEX MICROSCOPIC
Bilirubin, UA: NEGATIVE
Glucose, UA: NEGATIVE
Ketones, UA: NEGATIVE
Leukocytes,UA: NEGATIVE
Nitrite, UA: NEGATIVE
Protein,UA: NEGATIVE
RBC, UA: NEGATIVE
Specific Gravity, UA: 1.025 (ref 1.005–1.030)
Urobilinogen, Ur: 0.2 mg/dL (ref 0.2–1.0)
pH, UA: 5 (ref 5.0–7.5)

## 2022-12-20 MED ORDER — ONDANSETRON 4 MG PO TBDP: 4.0000 mg | ORAL_TABLET | Freq: Three times a day (TID) | ORAL | 0 refills | Status: DC | PRN

## 2022-12-20 MED ORDER — ALFUZOSIN HCL ER 10 MG PO TB24: 10.0000 mg | ORAL_TABLET | Freq: Every day | ORAL | 0 refills | Status: DC

## 2022-12-20 MED ORDER — HYDROCODONE-ACETAMINOPHEN 5-325 MG PO TABS: 1.0000 | ORAL_TABLET | Freq: Four times a day (QID) | ORAL | 0 refills | Status: DC | PRN

## 2022-12-20 NOTE — Progress Notes (Signed)
12/20/2022 1:08 PM   Joy Leach 06/12/1956 [id number]  Referring provider: Georgann Housekeeper, [place] [address]  Followup nephrolithiasis  HPI: [name] is a 67yo here for followup for nephrolithiasis. She passed multiple small calculi after ESWL. She has mild flank pain KUB does not show any definitive calculi.    PMH: Past Medical History:  Diagnosis Date   CHF (congestive heart failure) ([place])    Chronic kidney disease    Diabetes mellitus without complication ([place])    H/O blood clots    Headache    Hyperlipidemia    Hypertension    Lupus ([place])    Mycobacterial disease    Sicca ([place])     Surgical History: Past Surgical History:  Procedure Laterality Date   ABDOMINAL HYSTERECTOMY     BIOPSY  10/26/2021   Procedure: BIOPSY;  Surgeon: Harvel Quale, MD;  [facility];  Service: Gastroenterology;;   BIOPSY  04/02/2022   Procedure: BIOPSY;  Surgeon: Harvel Quale, MD;  [facility];  Service: Gastroenterology;;   COLONOSCOPY  05/26/2015   Dr Britta Mccreedy, mild diverticulosis in sigmoid colon, biosies taken from [facility] and sigmoid colon-negative   COLONOSCOPY WITH PROPOFOL N/A 04/02/2022   Procedure: COLONOSCOPY WITH PROPOFOL;  Surgeon: Harvel Quale, MD;  [facility];  Service: Gastroenterology;  Laterality: N/A;  815   ESOPHAGOGASTRODUODENOSCOPY (EGD) WITH PROPOFOL N/A 10/26/2021   Procedure: ESOPHAGOGASTRODUODENOSCOPY (EGD) WITH PROPOFOL;  Surgeon: Harvel Quale, MD;  [facility];  Service: Gastroenterology;  Laterality: N/A;  [place] LITHOTRIPSY Right 12/03/2022   Procedure: EXTRACORPOREAL SHOCK WAVE LITHOTRIPSY (ESWL);  Surgeon: Cleon Gustin, MD;  Location: [facility];  Service: Urology;  Laterality: Right;   HEMOSTASIS CLIP PLACEMENT  10/26/2021   Procedure: HEMOSTASIS CLIP PLACEMENT;  Surgeon: Harvel Quale, MD;   [facility]: [facility];  Service: Gastroenterology;;   HOT HEMOSTASIS  10/26/2021   Procedure: HOT HEMOSTASIS (ARGON PLASMA COAGULATION/BICAP);  Surgeon: Montez Morita, Quillian Quince, MD;  [facility]: [facility];  Service: Gastroenterology;;   hystorectomy     POLYPECTOMY  10/26/2021   Procedure: POLYPECTOMY;  Surgeon: Harvel Quale, MD;  [facility];  Service: Gastroenterology;;   POLYPECTOMY  04/02/2022   Procedure: POLYPECTOMY;  Surgeon: Harvel Quale, MD;  [facility];  Service: Gastroenterology;;   SINUS SURGERY WITH INSTATRAK  2015    Home Medications:  Allergies as of 12/20/2022       Reactions   Sulfa Antibiotics Hives   Caffeine Other (See Comments)   Bladder infections   Codeine Nausea And Vomiting        Medication List        Accurate as of December 20, 2022  1:08 PM. If you have any questions, ask your nurse or doctor.          albuterol 108 (90 Base) MCG/ACT inhaler Commonly known as: VENTOLIN HFA Inhale 1 puff into the lungs every 6 (six) hours as needed for shortness of breath.   alfuzosin 10 MG 24 hr tablet Commonly known as: UROXATRAL Take 1 tablet (10 mg total) by mouth daily with breakfast. What changed: when to take this Changed by: [name], MD   apixaban 2.5 MG Tabs tablet Commonly known as: ELIQUIS Take 5 mg by mouth 2 (two) times daily.   azithromycin 250 MG tablet Commonly known as: ZITHROMAX Take 250 mg by mouth  daily.   cyanocobalamin 1000 MCG tablet Commonly known as: VITAMIN B12 Take 1,000 mcg by mouth daily.   diclofenac sodium 1 % Gel Commonly known as: VOLTAREN Apply 1 application topically 3 (three) times daily as needed (knee pain.).   DULoxetine 30 MG capsule Commonly known as: CYMBALTA Take 30 mg by mouth daily.   ethambutol 400 MG tablet Commonly known as: MYAMBUTOL Take 1,200 mg by mouth daily.   ezetimibe 10 MG tablet Commonly known as: ZETIA Take 10 mg by mouth  daily.   fenofibrate 145 MG tablet Commonly known as: TRICOR Take 145 mg by mouth every evening.   ferrous sulfate 325 (65 FE) MG EC tablet Take 325 mg by mouth daily with breakfast.   furosemide 20 MG tablet Commonly known as: LASIX TAKE (1) TABLET BY MOUTH ONCE DAILY AS NEEDED. What changed: See the new instructions.   HYDROcodone-acetaminophen 5-325 MG tablet Commonly known as: NORCO/VICODIN Take 1-2 tablets by mouth every 6 (six) hours as needed.   hydrocortisone 2.5 % rectal cream Commonly known as: ANUSOL-HC Place 1 Application rectally 4 (four) times daily. Use four times per day x10 days then PRN thereafter   imiquimod 5 % cream Commonly known as: ALDARA Apply 1 Application topically 3 (three) times a week.   Janumet 50-1000 MG tablet Generic drug: sitaGLIPtin-metformin Take 1 tablet by mouth 2 (two) times daily.   linezolid 600 MG tablet Commonly known as: ZYVOX Take 600 mg by mouth 2 (two) times daily.   loperamide 2 MG tablet Commonly known as: IMODIUM A-D Take 2 mg by mouth 2 (two) times daily.   meclizine 25 MG tablet Commonly known as: ANTIVERT Take 25 mg by mouth 3 (three) times daily as needed for dizziness or nausea.   montelukast 10 MG tablet Commonly known as: SINGULAIR Take 10 mg by mouth at bedtime.   multivitamin tablet Take 1 tablet by mouth in the morning.   ondansetron 4 MG disintegrating tablet Commonly known as: ZOFRAN-ODT Take 1 tablet (4 mg total) by mouth every 8 (eight) hours as needed for nausea or vomiting.   pantoprazole 40 MG tablet Commonly known as: PROTONIX TAKE ONE TABLET BY MOUTH EVERY DAY   pioglitazone 15 MG tablet Commonly known as: ACTOS Take 15 mg by mouth daily.   predniSONE 10 MG tablet Commonly known as: DELTASONE Take 10 mg by mouth daily with breakfast. Take '10mg'$  every other day and '5mg'$  every other day   pregabalin 75 MG capsule Commonly known as: LYRICA Take 75 mg by mouth every 8 (eight) hours.    Turmeric 450 MG Caps Take 450 mg by mouth at bedtime.        Allergies:  Allergies  Allergen Reactions   Sulfa Antibiotics Hives   Caffeine Other (See Comments)    Bladder infections   Codeine Nausea And Vomiting    Family History: Family History  Problem Relation Age of Onset   Hypertension Mother    Diabetes Mother    Kidney disease Mother    Heart disease Mother    High Cholesterol Mother    Lung cancer Mother    Hypertension Father    Diabetes Father    Kidney disease Father    Heart disease Father     Social History:  reports that she has never smoked. She has never been exposed to tobacco smoke. She has never used smokeless tobacco. She reports that she does not drink alcohol and does not use drugs.  ROS: All other  review of systems were reviewed and are negative except what is noted above in HPI  Physical Exam: BP 119/73   Pulse 89   Constitutional:  Alert and oriented, No acute distress. HEENT: [place] AT, moist mucus membranes.  Trachea midline, no masses. Cardiovascular: No clubbing, cyanosis, or edema. Respiratory: Normal respiratory effort, no increased work of breathing. GI: Abdomen is soft, nontender, nondistended, no abdominal masses GU: No CVA tenderness.  Lymph: No cervical or inguinal lymphadenopathy. Skin: No rashes, bruises or suspicious lesions. Neurologic: Grossly intact, no focal deficits, moving all 4 extremities. Psychiatric: Normal mood and affect.  Laboratory Data: Lab Results  Component Value Date   WBC 5.9 11/27/2022   HGB 11.7 (L) 11/27/2022   HCT 36.1 11/27/2022   MCV 87.0 11/27/2022   PLT 179 11/27/2022    Lab Results  Component Value Date   CREATININE 1.72 (H) 11/27/2022    No results found for: "PSA"  No results found for: "TESTOSTERONE"  No results found for: "HGBA1C"  Urinalysis    Component Value Date/Time   COLORURINE YELLOW 11/27/2022 1010   APPEARANCEUR HAZY (A) 11/27/2022 1010   LABSPEC 1.015 11/27/2022  1010   PHURINE 8.0 11/27/2022 1010   GLUCOSEU NEGATIVE 11/27/2022 1010   HGBUR LARGE (A) 11/27/2022 1010   BILIRUBINUR NEGATIVE 11/27/2022 1010   KETONESUR NEGATIVE 11/27/2022 1010   PROTEINUR 30 (A) 11/27/2022 1010   NITRITE NEGATIVE 11/27/2022 1010   LEUKOCYTESUR TRACE (A) 11/27/2022 1010    Lab Results  Component Value Date   BACTERIA RARE (A) 11/27/2022    Pertinent Imaging: KUB today: Images reviewed and discussed with the patient  Results for orders placed during the hospital encounter of 12/03/22  DG Abd 1 View  Narrative CLINICAL DATA:  Ureteral calculus  EXAM: ABDOMEN - 1 VIEW  COMPARISON:  11/27/2022; CT abdomen pelvis-11/27/2022  FINDINGS: Punctate opacities overlie the expected location of the bilateral renal fossa with dominant right-sided opacity measuring 0.6 cm in diameter and dominant left-sided opacity measuring 0.3 cm.  There is a punctate (0.3 cm) opacity overlying the right mid abdomen as well as an additional ill-defined opacity measuring 3 mm overlying the right hemipelvis, nonspecific though potentially representative of right-sided ureteral calculi.  No definitive abnormal opacities overlies expected location of the left ureter.  Phleboliths overlie the lower pelvis bilaterally. No definitive bladder calculi.  Moderate colonic stool burden without evidence of enteric obstruction. No acute osseous abnormalities.  IMPRESSION: Bilateral nephrolithiasis with potential right-sided ureteral calculi as detailed above. Further evaluation with noncontrast CT scan of the abdomen pelvis could be performed as indicated.   Electronically Signed By: Sandi Mariscal M.D. On: 12/03/2022 08:12  No results found for this or any previous visit.  No results found for this or any previous visit.  No results found for this or any previous visit.  No results found for this or any previous visit.  No valid procedures specified. No results found for  this or any previous visit.  No results found for this or any previous visit.   Assessment & Plan:    1. Kidney stones Continue medical expulsive therapy.  - Urinalysis, Routine w reflex microscopic   No follow-ups on file.  [name], MD  [facility] [place]

## 2022-12-23 ENCOUNTER — Encounter (INDEPENDENT_AMBULATORY_CARE_PROVIDER_SITE_OTHER): Payer: Self-pay | Admitting: Gastroenterology

## 2022-12-23 ENCOUNTER — Ambulatory Visit (INDEPENDENT_AMBULATORY_CARE_PROVIDER_SITE_OTHER): Payer: Medicare HMO | Admitting: Gastroenterology

## 2022-12-23 VITALS — BP 100/61 | HR 101 | Temp 98.1°F | Ht 65.0 in | Wt 179.8 lb

## 2022-12-23 DIAGNOSIS — K219 Gastro-esophageal reflux disease without esophagitis: Secondary | ICD-10-CM | POA: Diagnosis not present

## 2022-12-23 DIAGNOSIS — K625 Hemorrhage of anus and rectum: Secondary | ICD-10-CM | POA: Diagnosis not present

## 2022-12-23 DIAGNOSIS — R110 Nausea: Secondary | ICD-10-CM | POA: Diagnosis not present

## 2022-12-23 NOTE — Progress Notes (Signed)
Referring Provider: Georgann Housekeeper, NP Primary Care Physician:  Georgann Housekeeper, NP Primary GI Physician: Jenetta Downer   Chief Complaint  Patient presents with   Hemorrhoids    Patient arrives with sister [name]. Follow up on diarrhea, nausea and rectal bleeding. Reports diarrhea is better. Still has some rectal bleeding that she reports is coming from her hemorrhoids. States she has kidney stones and believes the nausea is coming kidney stones. Takes zofran.    HPI:   Joy Leach is a 67 y.o. female with past medical history of CHF, HLD, HTN, Lupus, cutaneous mycobacterial disease   Patient presenting today for follow up of GERD, diarrhea, nausea and rectal bleeding.  Last seen September 2023, at that time continued to have intermittent diarrhea,  toilet tissues hematochezia. using preparation H with some improvement. Stopped ozempic and started on actos due to worsening diarrhea with ozempic. Symptoms seemed to have improved some with this. She is having rectal itching, burning and pain. She is having diarrhea 2-3x/week.  taking imodium daily with good results. She continues to endorse some darker stools most of the time, she is on a multi vitamin and PO iron pill. She is also in the process of starting B12 shots tomorrow.  having lower abdominal pain a few times per week. She is continued on 3 different antibiotics for her skin condition, she is continued on daily probiotic, states that she is still having work up at [facility] for her skin issues (mycobacterial disease) and antibiotics may be switched soon. still having nausea daily and taking a zofran daily with good results. Appetite is up and down but her sister states it is better than it was previously.  GERD is well managed with protonix   Recommended to start daily probiotic, surgical referral for GB, continue imodium PRN, continue PPI daily, zofran PRN, Rx anusol QID x10 days.   Present:  Patient reports that she was diagnosed with  renal calculi in February after she went to the ER with RLQ pain, hematuria and vomiting. She has had lithotripsy but has not passed the stones yet.  She continues to have a good bit of nausea, which seems worse since onset of kidney stones. she is taking zofran PRN, sometimes also has to take pepto bismol to help as well. Reports appetite is not good, she often makes herself eat. Weight is notably up 23 pounds since her last visit in September. Eating does not worse her nausea. Denies heartburn or acid reflux. No vomiting. Feels that diarrhea his improved. She is not taking any imodium. She is having 4-5 BMs per day but stools are formed. She continues to have intermittent toilet tissue hematochezia. She denies rectal pain or burning, she used anusol after last OV with some improvement. She is taking a probiotic daily. Hgb A1c was 6.1 at last check, blood sugars are pretty well controlled.   Notes she was taken off of antibiotics in January and then they were restarted shortly after for her history of Mycobacterium. She is on daily probiotic.   RUQ [place]: 03/04/22 increased echogenicity of the liver parenchyma which may represent hepatic steatosis and/or other hepatocellular disease. Gallbladder adenomyomatosis. Last Colonoscopy:04/02/22 - Three 3 to 5 mm polyps in the ascending colon and in the cecum - One 2 mm polyp in the ascending colon - Two 4 to 5 mm polyps in the transverse colon  - Diverticulosis in the sigmoid colon. Biopsied. - Congested mucosa in the rectum. normal colon Biopsied. - Non-bleeding external  and internal hemorrhoids. - Hemorrhoids found on perianal exam. (6 TAs, normal colonic biopsies) Last Endoscopy:10/26/21 normal esophagus  - Multiple gastric polyps. Fundic gland polyps - rest of the examined stomach was normal. Biopsied-normal - Normal duodenum. Biopsied-normal   Repeat Colonoscopy 3 years   Past Medical History:  Diagnosis Date   CHF (congestive heart failure)  (HCC)    Chronic kidney disease    Diabetes mellitus without complication ([place])    H/O blood clots    Headache    Hyperlipidemia    Hypertension    Lupus ([place])    Mycobacterial disease    Sicca ([place])     Past Surgical History:  Procedure Laterality Date   ABDOMINAL HYSTERECTOMY     BIOPSY  10/26/2021   Procedure: BIOPSY;  Surgeon: Harvel Quale, MD;  [facility];  Service: Gastroenterology;;   BIOPSY  04/02/2022   Procedure: BIOPSY;  Surgeon: Harvel Quale, MD;  [facility];  Service: Gastroenterology;;   COLONOSCOPY  05/26/2015   Dr Britta Mccreedy, mild diverticulosis in sigmoid colon, biosies taken from [facility] and sigmoid colon-negative   COLONOSCOPY WITH PROPOFOL N/A 04/02/2022   Procedure: COLONOSCOPY WITH PROPOFOL;  Surgeon: Harvel Quale, MD;  [facility];  Service: Gastroenterology;  Laterality: N/A;  815   ESOPHAGOGASTRODUODENOSCOPY (EGD) WITH PROPOFOL N/A 10/26/2021   Procedure: ESOPHAGOGASTRODUODENOSCOPY (EGD) WITH PROPOFOL;  Surgeon: Harvel Quale, MD;  [facility];  Service: Gastroenterology;  Laterality: N/A;  [place] LITHOTRIPSY Right 12/03/2022   Procedure: EXTRACORPOREAL SHOCK WAVE LITHOTRIPSY (ESWL);  Surgeon: Cleon Gustin, MD;  Location: [facility];  Service: Urology;  Laterality: Right;   HEMOSTASIS CLIP PLACEMENT  10/26/2021   Procedure: HEMOSTASIS CLIP PLACEMENT;  Surgeon: Harvel Quale, MD;  [facility];  Service: Gastroenterology;;   HOT HEMOSTASIS  10/26/2021   Procedure: HOT HEMOSTASIS (ARGON PLASMA COAGULATION/BICAP);  Surgeon: Montez Morita, Quillian Quince, MD;  [facility];  Service: Gastroenterology;;   hystorectomy     POLYPECTOMY  10/26/2021   Procedure: POLYPECTOMY;  Surgeon: Harvel Quale, MD;  [facility];  Service: Gastroenterology;;   POLYPECTOMY  04/02/2022   Procedure: POLYPECTOMY;   Surgeon: Montez Morita, Quillian Quince, MD;  [facility];  Service: Gastroenterology;;   SINUS SURGERY WITH INSTATRAK  2015    Current Outpatient Medications  Medication Sig Dispense Refill   albuterol (VENTOLIN HFA) 108 (90 Base) MCG/ACT inhaler Inhale 1 puff into the lungs every 6 (six) hours as needed for shortness of breath.     alfuzosin (UROXATRAL) 10 MG 24 hr tablet Take 1 tablet (10 mg total) by mouth daily with breakfast. 30 tablet 0   apixaban (ELIQUIS) 5 MG TABS tablet Take 5 mg by mouth 2 (two) times daily.     azithromycin (ZITHROMAX) 250 MG tablet Take 250 mg by mouth daily.     diclofenac sodium (VOLTAREN) 1 % GEL Apply 1 application topically 3 (three) times daily as needed (knee pain.).     ethambutol (MYAMBUTOL) 400 MG tablet Take 1,200 mg by mouth daily.     ezetimibe (ZETIA) 10 MG tablet Take 10 mg by mouth daily.     fenofibrate (TRICOR) 145 MG tablet Take 145 mg by mouth every evening.     ferrous sulfate 325 (65 FE) MG EC tablet Take 325 mg by mouth daily with breakfast.     furosemide (LASIX) 20 MG tablet TAKE (1) TABLET BY  MOUTH ONCE DAILY AS NEEDED. (Patient taking differently: Take 20 mg by mouth daily as needed (leg swelling).) 30 tablet 0   HYDROcodone-acetaminophen (NORCO/VICODIN) 5-325 MG tablet Take 1-2 tablets by mouth every 6 (six) hours as needed. 30 tablet 0   hydrocortisone (ANUSOL-HC) 2.5 % rectal cream Place 1 Application rectally 4 (four) times daily. Use four times per day x10 days then PRN thereafter 56 g 1   imiquimod (ALDARA) 5 % cream Apply 1 Application topically 3 (three) times a week.     JANUMET 50-1000 MG tablet Take 1 tablet by mouth 2 (two) times daily.     linezolid (ZYVOX) 600 MG tablet Take 600 mg by mouth 2 (two) times daily.     meclizine (ANTIVERT) 25 MG tablet Take 25 mg by mouth 3 (three) times daily as needed for dizziness or nausea.     montelukast (SINGULAIR) 10 MG tablet Take 10 mg by mouth at bedtime.  2   Multiple  Vitamin (MULTIVITAMIN) tablet Take 1 tablet by mouth in the morning.     ondansetron (ZOFRAN-ODT) 4 MG disintegrating tablet Take 1 tablet (4 mg total) by mouth every 8 (eight) hours as needed for nausea or vomiting. 30 tablet 0   pantoprazole (PROTONIX) 40 MG tablet TAKE ONE TABLET BY MOUTH EVERY DAY 90 tablet 2   pioglitazone (ACTOS) 15 MG tablet Take 15 mg by mouth daily.     predniSONE 5 MG TBEC Take 5 mg by mouth daily with breakfast. 5 mg daily     pregabalin (LYRICA) 75 MG capsule Take 75 mg by mouth every 8 (eight) hours.     Turmeric 450 MG CAPS Take 450 mg by mouth at bedtime.     No current facility-administered medications for this visit.    Allergies as of 12/23/2022 - Review Complete 12/23/2022  Allergen Reaction Noted   Sulfa antibiotics Hives 06/13/2016   Caffeine Other (See Comments) 05/01/2021   Codeine Nausea And Vomiting 06/13/2016    Family History  Problem Relation Age of Onset   Hypertension Mother    Diabetes Mother    Kidney disease Mother    Heart disease Mother    High Cholesterol Mother    Lung cancer Mother    Hypertension Father    Diabetes Father    Kidney disease Father    Heart disease Father     Social History   Socioeconomic History   Marital status: Divorced    Spouse name: Not on file   Number of children: 1   Years of education: Not on file   Highest education level: Not on file  Occupational History   Not on file  Tobacco Use   Smoking status: Never    Passive exposure: Never   Smokeless tobacco: Never  Vaping Use   Vaping Use: Never used  Substance and Sexual Activity   Alcohol use: No   Drug use: No   Sexual activity: Yes    Partners: Male  Other Topics Concern   Not on file  Social History Narrative   Lives home alone.  Widow.    Is Disabled.  One child.  Sister, [name].   Social Determinants of Health   Financial Resource Strain: Not on file  Food Insecurity: Not on file  Transportation Needs: Not on file   Physical Activity: Not on file  Stress: Not on file  Social Connections: Not on file    Review of systems General: negative for malaise, night sweats, fever, chills, weight  loss Neck: Negative for lumps, goiter, pain and significant neck swelling Resp: Negative for cough, wheezing, dyspnea at rest CV: Negative for chest pain, leg swelling, palpitations, orthopnea GI: denies melena, vomiting, diarrhea, constipation, dysphagia, odyonophagia, early satiety or unintentional weight loss. +nausea +toilet tissue hematochezia  MSK: Negative for joint pain or swelling, back pain, and muscle pain. Derm: Negative for itching or rash Psych: Denies depression, anxiety, memory loss, confusion. No homicidal or suicidal ideation.  Heme: Negative for prolonged bleeding, bruising easily, and swollen nodes. Endocrine: Negative for cold or heat intolerance, polyuria, polydipsia and goiter. Neuro: negative for tremor, gait imbalance, syncope and seizures. The remainder of the review of systems is noncontributory.  Physical Exam: BP 100/61 (BP Location: Left Arm, Patient Position: Sitting, Cuff Size: Large)   Pulse (!) 101   Temp 98.1 F (36.7 C) (Oral)   Ht '5\' 5"'$  (1.651 m)   Wt 179 lb 12.8 oz (81.6 kg)   BMI 29.92 kg/m  General:   Alert and oriented. No distress noted. Pleasant and cooperative.  Head:  Normocephalic and atraumatic. Eyes:  Conjuctiva clear without scleral icterus. Mouth:  Oral mucosa pink and moist. Good dentition. No lesions. Heart: Normal rate and rhythm, s1 and s2 heart sounds present.  Lungs: Clear lung sounds in all lobes. Respirations equal and unlabored. Abdomen:  +BS, soft, non-tender and non-distended. No rebound or guarding. No HSM or masses noted. Derm: No palmar erythema or jaundice Msk:  Symmetrical without gross deformities. Normal posture. Extremities:  Without edema. Neurologic:  Alert and  oriented x4 Psych:  Alert and cooperative. Normal mood and  affect.  Invalid input(s): "6 MONTHS"   ASSESSMENT: Joy Leach is a 67 y.o. female presenting today for follow up of GERD, diarrhea, nausea, rectal bleeding.  GERD: well controlled on pantoprazole '40mg'$  daily. Will continue with current PPI regimen. TIF procedure brochure provided. we discussed this briefly. Patient is interested in potentially pursuing this once she is doing better clinically from some of her other health issues.  Nausea: no vomiting. This has been ongoing, EGD last year was unremarkable for etiology. She notes it has become worse since diagnosed with renal calculi. She is maintained on multiple antibiotics for hx of mycobacterium, suspect etiology is multifactorial. DM is well controlled, though we will consider GES to evaluate for gastroparesis if her nausea continues, patient is amenable to discussing this further at next visit after she is hopefully feeling better from her kidney stones.  Diarrhea: not having any issues currently. She is continued on multiple antibiotics for mycobacterium, doing well on daily probiotic, she should continue with this, taking 4 hours apart from her antibiotics.  Rectal bleeding: suspected secondary to hemorrhoids. Last TCS with hemorrhoids. She continues to have intermittent toilet tissue hematochezia, though improved since anusol. She has no rectal pain or discomfort. Can continue with anusol as needed, should avoid straining and limit toilet time to <5 minutes.    PLAN:  Continue Pantoprazole '40mg'$  daily  2. Continue zofran PRN  3. Keep taking daily probiotic 4. Consider GES if nausea not improving after acute illness resolves. 5. TIF brochure provided  6. Anusol PRN, avoid straining, limit toilet time   All questions were answered, patient verbalized understanding and is in agreement with plan as outlined above.    Follow Up: 6 months   Tayten Bergdoll L. Alver Sorrow, MSN, APRN, AGNP-C Adult-Gerontology Nurse Practitioner [facility]  for GI [facility]  I have reviewed the note and agree with the APP's assessment as described  in this progress note  Maylon Peppers, MD Gastroenterology and Hepatology [facility]

## 2022-12-23 NOTE — Patient Instructions (Signed)
Continue pantoprazole '40mg'$  daily and daily probiotic As discussed, if nausea persists, we can consider doing a gastric emptying study to evaluate this further I am providing a handout for the TIF procedure for you to read over.  Please let me know if you have any new or worsening GI symptoms  Follow up 6 months  It was a pleasure to see you today. I want to create trusting relationships with patients and provide genuine, compassionate, and quality care. I truly value your feedback! please be on the lookout for a survey regarding your visit with me today. I appreciate your input about our visit and your time in completing this!    Aurora Rody L. Alver Sorrow, MSN, APRN, AGNP-C Adult-Gerontology Nurse Practitioner [facility] at [facility]

## 2023-01-03 ENCOUNTER — Ambulatory Visit (HOSPITAL_COMMUNITY)
Admission: RE | Admit: 2023-01-03 | Discharge: 2023-01-03 | Disposition: A | Discharge status: Home / Self Care | Payer: Medicare HMO | Source: Ambulatory Visit | Attending: Urology | Admitting: Urology

## 2023-01-03 ENCOUNTER — Ambulatory Visit: Payer: Medicare HMO | Admitting: Urology

## 2023-01-03 ENCOUNTER — Encounter: Payer: Self-pay | Admitting: Urology

## 2023-01-03 VITALS — BP 115/75 | HR 78 | Ht 65.0 in | Wt 179.0 lb

## 2023-01-03 DIAGNOSIS — N200 Calculus of kidney: Secondary | ICD-10-CM | POA: Diagnosis present

## 2023-01-03 LAB — URINALYSIS, ROUTINE W REFLEX MICROSCOPIC
Bilirubin, UA: NEGATIVE
Glucose, UA: NEGATIVE
Ketones, UA: NEGATIVE
Nitrite, UA: NEGATIVE
Protein,UA: NEGATIVE
RBC, UA: NEGATIVE
Specific Gravity, UA: 1.02 (ref 1.005–1.030)
Urobilinogen, Ur: 0.2 mg/dL (ref 0.2–1.0)
pH, UA: 5 (ref 5.0–7.5)

## 2023-01-03 MED ORDER — ALFUZOSIN HCL ER 10 MG PO TB24: 10.0000 mg | ORAL_TABLET | Freq: Every day | ORAL | 0 refills | Status: DC

## 2023-01-03 NOTE — Patient Instructions (Signed)
Ureteroscopy  Ureteroscopy is a procedure to check for and treat problems inside part of the urinary tract. In this procedure, a long rigid or flexible tube with a lens and light at the end (ureteroscope) is used to look at the inside of the kidneys and the ureters. The ureters are the tubes that carry urine from the kidneys to the bladder. The ureteroscope is inserted into one or both of the ureters. You may need this procedure if you have frequent urinary tract infections (UTIs), blood in your urine, or a stone in one or both of your ureters. A ureteroscopy can be done: To find the cause of urine blockage in a ureter and to evaluate other abnormalities inside the ureters or kidneys. To remove stones. To remove or treat growths of tissue (polyps), abnormal tissue, and some types of tumors. To remove a tissue sample and check it for disease under a microscope (biopsy). Tell a health care provider about: Any allergies you have. All medicines you are taking, including vitamins, herbs, eye drops, creams, and over-the-counter medicines. Any problems you or family members have had with anesthetic medicines. Any bleeding problems you have. Any surgeries you have had. Any medical conditions you have. Whether you are pregnant or may be pregnant. What are the risks? Your health care provider will talk with you about risks. These may include: Abdominal pain or a burning feeling or pain while urinating. Abnormal bleeding. A UTI. Allergic reactions to medicines. Scarring that narrows the ureter (stricture) or swelling. Creating a hole (perforation) in the ureter. Damage to other structures or organs, such as the part of your body that drains urine from your bladder (urethra), your bladder, or your uterus. What happens before the procedure? When to stop eating and drinking  8 hours before your procedure Stop eating most foods. Do not eat meat, fried foods, or fatty foods. Eat only light foods, such  as toast or crackers. All liquids are okay except energy drinks and alcohol. 6 hours before your procedure Stop eating. Drink only clear liquids, such as water, clear fruit juice, black coffee, plain tea, and sports drinks. Do not drink energy drinks or alcohol. 2 hours before your procedure Stop drinking all liquids. You may be allowed to take medicines with small sips of water. Medicines Ask your health care provider about: Changing or stopping your regular medicines. These include any diabetes medicines or blood thinners you take. Taking medicines such as aspirin and ibuprofen. These medicines can thin your blood. Do not take these medicines unless your health care provider tells you to. Taking over-the-counter medicines, vitamins, herbs, and supplements. General instructions Do not use any products that contain nicotine or tobacco for at least 4 weeks before the procedure. These products include cigarettes, chewing tobacco, and vaping devices, such as e-cigarettes. If you need help quitting, ask your health care provider. If you will be going home right after the procedure, plan to have a responsible adult: Take you home from the hospital or clinic. You will not be allowed to drive. Care for you for the time you are told. Ask your health care provider what steps will be taken to help prevent infection. These may include: Washing skin with a soap that kills germs. Receiving antibiotic medicine. Tests You may have an exam or testing. You may have a urine sample taken to check for infection. What happens during the procedure? An IV will be inserted into one of your veins. You may be given: A sedative. This helps   you relax. Anesthesia. This will: Numb certain areas of your body. Make you fall asleep for surgery. Your urethra will be cleaned with a germ-killing solution. The ureteroscope will be passed through your urethra into your bladder. A salt-water solution will be sent through  the ureteroscope to fill your bladder. This will help the health care provider see the openings of your ureters more clearly. The ureteroscope will be passed into your ureter. If a growth is found, a biopsy may be done. If a stone is found, it may be removed through the ureteroscope, or the stone may be broken up using a laser, shock waves, or electrical energy. In some cases, if the ureter is too small, a tube may be inserted that keeps the ureter open (ureteral stent). The stent may be left in place for 1 or 2 weeks, and then the ureteroscopy procedure will be done again. The scope will be removed, and your bladder will be emptied. The procedure may vary among health care providers and hospitals. What happens after the procedure? Your blood pressure, heart rate, breathing rate, and blood oxygen level will be monitored until you leave the hospital or clinic. It is up to you to get the results of your procedure. Ask your health care provider, or the department that is doing the procedure, when your results will be ready. Summary Ureteroscopy is a procedure used to look at the inside of the kidneys and the ureters. You may need this procedure if you have frequent urinary tract infections (UTIs), blood in your urine, or a stone in one or both of your ureters. Follow instructions from your health care provider about eating and drinking. In some cases, if the ureter is too small, a tube may be inserted that keeps the ureter open (ureteral stent). The stent may be left in place for 1 or 2 weeks to keep the ureter open, and then the ureteroscopy procedure will be done again. This information is not intended to replace advice given to you by your health care provider. Make sure you discuss any questions you have with your health care provider. Document Revised: 01/25/2022 Document Reviewed: 01/25/2022 Elsevier Patient Education  2023 [employer]  

## 2023-01-03 NOTE — H&P (View-Only) (Signed)
 01/03/2023 12:50 PM   Denisha Saephan 02/11/1956 [id number]  Referring provider: Chapell, Patty J, NP [address] [place] [address]  Followup nephrolithiasis   HPI: [name] is a 67yo here for followup for nephrolithiasis. She has not passed any additional fragments since last visit. KUb from today shows 2 right renal calculi but no ureteral calculi. She continues to have intermittent right flank pain. No significant LUTS   PMH: Past Medical History:  Diagnosis Date   CHF (congestive heart failure) (HCC)    Chronic kidney disease    Diabetes mellitus without complication (HCC)    H/O blood clots    Headache    Hyperlipidemia    Hypertension    Lupus (HCC)    Mycobacterial disease    Sicca (HCC)     Surgical History: Past Surgical History:  Procedure Laterality Date   ABDOMINAL HYSTERECTOMY     BIOPSY  10/26/2021   Procedure: BIOPSY;  Surgeon: Castaneda Mayorga, Daniel, MD;  [facility];  Service: Gastroenterology;;   BIOPSY  04/02/2022   Procedure: BIOPSY;  Surgeon: Castaneda Mayorga, Daniel, MD;  [facility];  Service: Gastroenterology;;   COLONOSCOPY  05/26/2015   Dr benson, mild diverticulosis in sigmoid colon, biosies taken from [facility] and sigmoid colon-negative   COLONOSCOPY WITH PROPOFOL N/A 04/02/2022   Procedure: COLONOSCOPY WITH PROPOFOL;  Surgeon: Castaneda Mayorga, Daniel, MD;  [facility];  Service: Gastroenterology;  Laterality: N/A;  815   ESOPHAGOGASTRODUODENOSCOPY (EGD) WITH PROPOFOL N/A 10/26/2021   Procedure: ESOPHAGOGASTRODUODENOSCOPY (EGD) WITH PROPOFOL;  Surgeon: Castaneda Mayorga, Daniel, MD;  [facility];  Service: Gastroenterology;  Laterality: N/A;  1045   EXTRACORPOREAL SHOCK WAVE LITHOTRIPSY Right 12/03/2022   Procedure: EXTRACORPOREAL SHOCK WAVE LITHOTRIPSY (ESWL);  Surgeon: Leyana Whidden L, MD;  [facility]: [facility];  Service: Urology;  Laterality: Right;   HEMOSTASIS CLIP PLACEMENT   10/26/2021   Procedure: HEMOSTASIS CLIP PLACEMENT;  Surgeon: Castaneda Mayorga, Daniel, MD;  [facility];  Service: Gastroenterology;;   HOT HEMOSTASIS  10/26/2021   Procedure: HOT HEMOSTASIS (ARGON PLASMA COAGULATION/BICAP);  Surgeon: Castaneda Mayorga, Daniel, MD;  [facility];  Service: Gastroenterology;;   hystorectomy     POLYPECTOMY  10/26/2021   Procedure: POLYPECTOMY;  Surgeon: Castaneda Mayorga, Daniel, MD;  [facility];  Service: Gastroenterology;;   POLYPECTOMY  04/02/2022   Procedure: POLYPECTOMY;  Surgeon: Castaneda Mayorga, Daniel, MD;  [facility];  Service: Gastroenterology;;   SINUS SURGERY WITH INSTATRAK  2015    Home Medications:  Allergies as of 01/03/2023       Reactions   Sulfa Antibiotics Hives   Caffeine Other (See Comments)   Bladder infections   Codeine Nausea And Vomiting        Medication List        Accurate as of January 03, 2023 12:50 PM. If you have any questions, ask your nurse or doctor.          albuterol 108 (90 Base) MCG/ACT inhaler Commonly known as: VENTOLIN HFA Inhale 1 puff into the lungs every 6 (six) hours as needed for shortness of breath.   alfuzosin 10 MG 24 hr tablet Commonly known as: UROXATRAL Take 1 tablet (10 mg total) by mouth daily with breakfast.   apixaban 5 MG Tabs tablet Commonly known as: ELIQUIS Take 5 mg by mouth 2 (two) times daily.   azithromycin 250 MG tablet Commonly known as: ZITHROMAX Take 250 mg   by mouth daily.   diclofenac sodium 1 % Gel Commonly known as: VOLTAREN Apply 1 application topically 3 (three) times daily as needed (knee pain.).   ethambutol 400 MG tablet Commonly known as: MYAMBUTOL Take 1,200 mg by mouth daily.   ezetimibe 10 MG tablet Commonly known as: ZETIA Take 10 mg by mouth daily.   fenofibrate 145 MG tablet Commonly known as: TRICOR Take 145 mg by mouth every evening.   ferrous sulfate 325 (65 FE) MG EC tablet Take 325  mg by mouth daily with breakfast.   furosemide 20 MG tablet Commonly known as: LASIX TAKE (1) TABLET BY MOUTH ONCE DAILY AS NEEDED. What changed: See the new instructions.   HYDROcodone-acetaminophen 5-325 MG tablet Commonly known as: NORCO/VICODIN Take 1-2 tablets by mouth every 6 (six) hours as needed.   hydrocortisone 2.5 % rectal cream Commonly known as: ANUSOL-HC Place 1 Application rectally 4 (four) times daily. Use four times per day x10 days then PRN thereafter   imiquimod 5 % cream Commonly known as: ALDARA Apply 1 Application topically 3 (three) times a week.   Janumet 50-1000 MG tablet Generic drug: sitaGLIPtin-metformin Take 1 tablet by mouth 2 (two) times daily.   linezolid 600 MG tablet Commonly known as: ZYVOX Take 600 mg by mouth 2 (two) times daily.   meclizine 25 MG tablet Commonly known as: ANTIVERT Take 25 mg by mouth 3 (three) times daily as needed for dizziness or nausea.   montelukast 10 MG tablet Commonly known as: SINGULAIR Take 10 mg by mouth at bedtime.   multivitamin tablet Take 1 tablet by mouth in the morning.   ondansetron 4 MG disintegrating tablet Commonly known as: ZOFRAN-ODT Take 1 tablet (4 mg total) by mouth every 8 (eight) hours as needed for nausea or vomiting.   pantoprazole 40 MG tablet Commonly known as: PROTONIX TAKE ONE TABLET BY MOUTH EVERY DAY   pioglitazone 15 MG tablet Commonly known as: ACTOS Take 15 mg by mouth daily.   predniSONE 5 MG Tbec Take 5 mg by mouth daily with breakfast. 5 mg daily   pregabalin 75 MG capsule Commonly known as: LYRICA Take 75 mg by mouth every 8 (eight) hours.   Turmeric 450 MG Caps Take 450 mg by mouth at bedtime.        Allergies:  Allergies  Allergen Reactions   Sulfa Antibiotics Hives   Caffeine Other (See Comments)    Bladder infections   Codeine Nausea And Vomiting    Family History: Family History  Problem Relation Age of Onset   Hypertension Mother     Diabetes Mother    Kidney disease Mother    Heart disease Mother    High Cholesterol Mother    Lung cancer Mother    Hypertension Father    Diabetes Father    Kidney disease Father    Heart disease Father     Social History:  reports that she has never smoked. She has never been exposed to tobacco smoke. She has never used smokeless tobacco. She reports that she does not drink alcohol and does not use drugs.  ROS: All other review of systems were reviewed and are negative except what is noted above in HPI  Physical Exam: BP 115/75   Pulse 78   Ht 5' 5" (1.651 m)   Wt 179 lb (81.2 kg)   BMI 29.79 kg/m   Constitutional:  Alert and oriented, No acute distress. HEENT: [place] AT, moist mucus membranes.  Trachea midline,   no masses. Cardiovascular: No clubbing, cyanosis, or edema. Respiratory: Normal respiratory effort, no increased work of breathing. GI: Abdomen is soft, nontender, nondistended, no abdominal masses GU: No CVA tenderness.  Lymph: No cervical or inguinal lymphadenopathy. Skin: No rashes, bruises or suspicious lesions. Neurologic: Grossly intact, no focal deficits, moving all 4 extremities. Psychiatric: Normal mood and affect.  Laboratory Data: Lab Results  Component Value Date   WBC 5.9 11/27/2022   HGB 11.7 (L) 11/27/2022   HCT 36.1 11/27/2022   MCV 87.0 11/27/2022   PLT 179 11/27/2022    Lab Results  Component Value Date   CREATININE 1.72 (H) 11/27/2022    No results found for: "PSA"  No results found for: "TESTOSTERONE"  No results found for: "HGBA1C"  Urinalysis    Component Value Date/Time   COLORURINE YELLOW 11/27/2022 1010   APPEARANCEUR Clear 12/20/2022 1247   LABSPEC 1.015 11/27/2022 1010   PHURINE 8.0 11/27/2022 1010   GLUCOSEU Negative 12/20/2022 1247   HGBUR LARGE (A) 11/27/2022 1010   BILIRUBINUR Negative 12/20/2022 1247   KETONESUR NEGATIVE 11/27/2022 1010   PROTEINUR Negative 12/20/2022 1247   PROTEINUR 30 (A) 11/27/2022 1010    NITRITE Negative 12/20/2022 1247   NITRITE NEGATIVE 11/27/2022 1010   LEUKOCYTESUR Negative 12/20/2022 1247   LEUKOCYTESUR TRACE (A) 11/27/2022 1010    Lab Results  Component Value Date   LABMICR Comment 12/20/2022   BACTERIA RARE (A) 11/27/2022    Pertinent Imaging: KUb today: Images reviewed and discussed with the patient  Results for orders placed during the hospital encounter of 12/20/22  DG Abd 1 View  Narrative CLINICAL DATA:  Post ESWL.  Evaluate for kidney stones.  EXAM: ABDOMEN - 1 VIEW  COMPARISON:  KUB December 03, 2022  FINDINGS: Stones remain in the lower poles of both kidneys. No definite ureteral stones noted. Calcifications in the pelvis are stable, likely phleboliths.  IMPRESSION: Stones remain in the lower poles of both kidneys. No definite ureteral stones identified.   Electronically Signed By: David  Williams III M.D. On: 12/22/2022 15:40  No results found for this or any previous visit.  No results found for this or any previous visit.  No results found for this or any previous visit.  No results found for this or any previous visit.  No valid procedures specified. No results found for this or any previous visit.  No results found for this or any previous visit.   Assessment & Plan:    1. Kidney stones -We discussed the management of kidney stones. These options include observation, ureteroscopy, shockwave lithotripsy (ESWL) and percutaneous nephrolithotomy (PCNL). We discussed which options are relevant to the patient's stone(s). We discussed the natural history of kidney stones as well as the complications of untreated stones and the impact on quality of life without treatment as well as with each of the above listed treatments. We also discussed the efficacy of each treatment in its ability to clear the stone burden. With any of these management options I discussed the signs and symptoms of infection and the need for emergent treatment  should these be experienced. For each option we discussed the ability of each procedure to clear the patient of their stone burden.   For observation I described the risks which include but are not limited to silent renal damage, life-threatening infection, need for emergent surgery, failure to pass stone and pain.   For ureteroscopy I described the risks which include bleeding, infection, damage to contiguous structures, positioning injury, ureteral   stricture, ureteral avulsion, ureteral injury, need for prolonged ureteral stent, inability to perform ureteroscopy, need for an interval procedure, inability to clear stone burden, stent discomfort/pain, heart attack, stroke, pulmonary embolus and the inherent risks with general anesthesia.   For shockwave lithotripsy I described the risks which include arrhythmia, kidney contusion, kidney hemorrhage, need for transfusion, pain, inability to adequately break up stone, inability to pass stone fragments, Steinstrasse, infection associated with obstructing stones, need for alternate surgical procedure, need for repeat shockwave lithotripsy, MI, CVA, PE and the inherent risks with anesthesia/conscious sedation.   For PCNL I described the risks including positioning injury, pneumothorax, hydrothorax, need for chest tube, inability to clear stone burden, renal laceration, arterial venous fistula or malformation, need for embolization of kidney, loss of kidney or renal function, need for repeat procedure, need for prolonged nephrostomy tube, ureteral avulsion, MI, CVA, PE and the inherent risks of general anesthesia.   - The patient would like to proceed with right ureteroscopic stone extraction - Urinalysis, Routine w reflex microscopic   No follow-ups on file.  Damonte Frieson, MD  [facility] [place]   

## 2023-01-03 NOTE — Progress Notes (Signed)
01/03/2023 12:50 PM   [name] [birth year] [id number]  Referring provider: Georgann Housekeeper, [place] [address]  Followup nephrolithiasis   HPI: Ms Foyle is a 67yo here for followup for nephrolithiasis. She has not passed any additional fragments since last visit. KUb from today shows 2 right renal calculi but no ureteral calculi. She continues to have intermittent right flank pain. No significant LUTS   PMH: Past Medical History:  Diagnosis Date   CHF (congestive heart failure) (HCC)    Chronic kidney disease    Diabetes mellitus without complication ([place])    H/O blood clots    Headache    Hyperlipidemia    Hypertension    Lupus ([place])    Mycobacterial disease    Sicca ([place])     Surgical History: Past Surgical History:  Procedure Laterality Date   ABDOMINAL HYSTERECTOMY     BIOPSY  10/26/2021   Procedure: BIOPSY;  Surgeon: Harvel Quale, MD;  [facility];  Service: Gastroenterology;;   BIOPSY  04/02/2022   Procedure: BIOPSY;  Surgeon: Harvel Quale, MD;  [facility];  Service: Gastroenterology;;   COLONOSCOPY  05/26/2015   Dr Britta Mccreedy, mild diverticulosis in sigmoid colon, [facility]ies taken from [facility] and sigmoid colon-negative   COLONOSCOPY WITH PROPOFOL N/A 04/02/2022   Procedure: COLONOSCOPY WITH PROPOFOL;  Surgeon: Harvel Quale, MD;  [facility];  Service: Gastroenterology;  Laterality: N/A;  815   ESOPHAGOGASTRODUODENOSCOPY (EGD) WITH PROPOFOL N/A 10/26/2021   Procedure: ESOPHAGOGASTRODUODENOSCOPY (EGD) WITH PROPOFOL;  Surgeon: Harvel Quale, MD;  [facility];  Service: Gastroenterology;  Laterality: N/A;  [place] LITHOTRIPSY Right 12/03/2022   Procedure: EXTRACORPOREAL SHOCK WAVE LITHOTRIPSY (ESWL);  Surgeon: Cleon Gustin, MD;  [facility];  Service: Urology;  Laterality: Right;   HEMOSTASIS CLIP PLACEMENT   10/26/2021   Procedure: HEMOSTASIS CLIP PLACEMENT;  Surgeon: Harvel Quale, MD;  [facility];  Service: Gastroenterology;;   HOT HEMOSTASIS  10/26/2021   Procedure: HOT HEMOSTASIS (ARGON PLASMA COAGULATION/BICAP);  Surgeon: Montez Morita, Quillian Quince, MD;  [facility];  Service: Gastroenterology;;   hystorectomy     POLYPECTOMY  10/26/2021   Procedure: POLYPECTOMY;  Surgeon: Harvel Quale, MD;  [facility];  Service: Gastroenterology;;   POLYPECTOMY  04/02/2022   Procedure: POLYPECTOMY;  Surgeon: Harvel Quale, MD;  [facility];  Service: Gastroenterology;;   SINUS SURGERY WITH INSTATRAK  2015    Home Medications:  Allergies as of 01/03/2023       Reactions   Sulfa Antibiotics Hives   Caffeine Other (See Comments)   Bladder infections   Codeine Nausea And Vomiting        Medication List        Accurate as of January 03, 2023 12:50 PM. If you have any questions, ask your nurse or doctor.          albuterol 108 (90 Base) MCG/ACT inhaler Commonly known as: VENTOLIN HFA Inhale 1 puff into the lungs every 6 (six) hours as needed for shortness of breath.   alfuzosin 10 MG 24 hr tablet Commonly known as: UROXATRAL Take 1 tablet (10 mg total) by mouth daily with breakfast.   apixaban 5 MG Tabs tablet Commonly known as: ELIQUIS Take 5 mg by mouth 2 (two) times daily.   azithromycin 250 MG tablet Commonly known as: ZITHROMAX Take 250 mg  by mouth daily.   diclofenac sodium 1 % Gel Commonly known as: VOLTAREN Apply 1 application topically 3 (three) times daily as needed (knee pain.).   ethambutol 400 MG tablet Commonly known as: MYAMBUTOL Take 1,200 mg by mouth daily.   ezetimibe 10 MG tablet Commonly known as: ZETIA Take 10 mg by mouth daily.   fenofibrate 145 MG tablet Commonly known as: TRICOR Take 145 mg by mouth every evening.   ferrous sulfate 325 (65 FE) MG EC tablet Take 325  mg by mouth daily with breakfast.   furosemide 20 MG tablet Commonly known as: LASIX TAKE (1) TABLET BY MOUTH ONCE DAILY AS NEEDED. What changed: See the new instructions.   HYDROcodone-acetaminophen 5-325 MG tablet Commonly known as: NORCO/VICODIN Take 1-2 tablets by mouth every 6 (six) hours as needed.   hydrocortisone 2.5 % rectal cream Commonly known as: ANUSOL-HC Place 1 Application rectally 4 (four) times daily. Use four times per day x10 days then PRN thereafter   imiquimod 5 % cream Commonly known as: ALDARA Apply 1 Application topically 3 (three) times a week.   Janumet 50-1000 MG tablet Generic drug: sitaGLIPtin-metformin Take 1 tablet by mouth 2 (two) times daily.   linezolid 600 MG tablet Commonly known as: ZYVOX Take 600 mg by mouth 2 (two) times daily.   meclizine 25 MG tablet Commonly known as: ANTIVERT Take 25 mg by mouth 3 (three) times daily as needed for dizziness or nausea.   montelukast 10 MG tablet Commonly known as: SINGULAIR Take 10 mg by mouth at bedtime.   multivitamin tablet Take 1 tablet by mouth in the morning.   ondansetron 4 MG disintegrating tablet Commonly known as: ZOFRAN-ODT Take 1 tablet (4 mg total) by mouth every 8 (eight) hours as needed for nausea or vomiting.   pantoprazole 40 MG tablet Commonly known as: PROTONIX TAKE ONE TABLET BY MOUTH EVERY DAY   pioglitazone 15 MG tablet Commonly known as: ACTOS Take 15 mg by mouth daily.   predniSONE 5 MG Tbec Take 5 mg by mouth daily with breakfast. 5 mg daily   pregabalin 75 MG capsule Commonly known as: LYRICA Take 75 mg by mouth every 8 (eight) hours.   Turmeric 450 MG Caps Take 450 mg by mouth at bedtime.        Allergies:  Allergies  Allergen Reactions   Sulfa Antibiotics Hives   Caffeine Other (See Comments)    Bladder infections   Codeine Nausea And Vomiting    Family History: Family History  Problem Relation Age of Onset   Hypertension Mother     Diabetes Mother    Kidney disease Mother    Heart disease Mother    High Cholesterol Mother    Lung cancer Mother    Hypertension Father    Diabetes Father    Kidney disease Father    Heart disease Father     Social History:  reports that she has never smoked. She has never been exposed to tobacco smoke. She has never used smokeless tobacco. She reports that she does not drink alcohol and does not use drugs.  ROS: All other review of systems were reviewed and are negative except what is noted above in HPI  Physical Exam: BP 115/75   Pulse 78   Ht 5\' 5"  (1.651 m)   Wt 179 lb (81.2 kg)   BMI 29.79 kg/m   Constitutional:  Alert and oriented, No acute distress. HEENT: [place] AT, moist mucus membranes.  Trachea midline,  no masses. Cardiovascular: No clubbing, cyanosis, or edema. Respiratory: Normal respiratory effort, no increased work of breathing. GI: Abdomen is soft, nontender, nondistended, no abdominal masses GU: No CVA tenderness.  Lymph: No cervical or inguinal lymphadenopathy. Skin: No rashes, bruises or suspicious lesions. Neurologic: Grossly intact, no focal deficits, moving all 4 extremities. Psychiatric: Normal mood and affect.  Laboratory Data: Lab Results  Component Value Date   WBC 5.9 11/27/2022   HGB 11.7 (L) 11/27/2022   HCT 36.1 11/27/2022   MCV 87.0 11/27/2022   PLT 179 11/27/2022    Lab Results  Component Value Date   CREATININE 1.72 (H) 11/27/2022    No results found for: "PSA"  No results found for: "TESTOSTERONE"  No results found for: "HGBA1C"  Urinalysis    Component Value Date/Time   COLORURINE YELLOW 11/27/2022 1010   APPEARANCEUR Clear 12/20/2022 1247   LABSPEC 1.015 11/27/2022 1010   PHURINE 8.0 11/27/2022 1010   GLUCOSEU Negative 12/20/2022 1247   HGBUR LARGE (A) 11/27/2022 1010   BILIRUBINUR Negative 12/20/2022 1247   KETONESUR NEGATIVE 11/27/2022 1010   PROTEINUR Negative 12/20/2022 1247   PROTEINUR 30 (A) 11/27/2022 1010    NITRITE Negative 12/20/2022 1247   NITRITE NEGATIVE 11/27/2022 1010   LEUKOCYTESUR Negative 12/20/2022 1247   LEUKOCYTESUR TRACE (A) 11/27/2022 1010    Lab Results  Component Value Date   LABMICR Comment 12/20/2022   BACTERIA RARE (A) 11/27/2022    Pertinent Imaging: KUb today: Images reviewed and discussed with the patient  Results for orders placed during the hospital encounter of 12/20/22  DG Abd 1 View  Narrative CLINICAL DATA:  Post ESWL.  Evaluate for kidney stones.  EXAM: ABDOMEN - 1 VIEW  COMPARISON:  KUB December 03, 2022  FINDINGS: Stones remain in the lower poles of both kidneys. No definite ureteral stones noted. Calcifications in the pelvis are stable, likely phleboliths.  IMPRESSION: Stones remain in the lower poles of both kidneys. No definite ureteral stones identified.   Electronically Signed By: Dorise Bullion III M.D. On: 12/22/2022 15:40  No results found for this or any previous visit.  No results found for this or any previous visit.  No results found for this or any previous visit.  No results found for this or any previous visit.  No valid procedures specified. No results found for this or any previous visit.  No results found for this or any previous visit.   Assessment & Plan:    1. Kidney stones -We discussed the management of kidney stones. These options include observation, ureteroscopy, shockwave lithotripsy (ESWL) and percutaneous nephrolithotomy (PCNL). We discussed which options are relevant to the patient's stone(s). We discussed the natural history of kidney stones as well as the complications of untreated stones and the impact on quality of life without treatment as well as with each of the above listed treatments. We also discussed the efficacy of each treatment in its ability to clear the stone burden. With any of these management options I discussed the signs and symptoms of infection and the need for emergent treatment  should these be experienced. For each option we discussed the ability of each procedure to clear the patient of their stone burden.   For observation I described the risks which include but are not limited to silent renal damage, life-threatening infection, need for emergent surgery, failure to pass stone and pain.   For ureteroscopy I described the risks which include bleeding, infection, damage to contiguous structures, positioning injury, ureteral  stricture, ureteral avulsion, ureteral injury, need for prolonged ureteral stent, inability to perform ureteroscopy, need for an interval procedure, inability to clear stone burden, stent discomfort/pain, heart attack, stroke, pulmonary embolus and the inherent risks with general anesthesia.   For shockwave lithotripsy I described the risks which include arrhythmia, kidney contusion, kidney hemorrhage, need for transfusion, pain, inability to adequately break up stone, inability to pass stone fragments, Steinstrasse, infection associated with obstructing stones, need for alternate surgical procedure, need for repeat shockwave lithotripsy, MI, CVA, PE and the inherent risks with anesthesia/conscious sedation.   For PCNL I described the risks including positioning injury, pneumothorax, hydrothorax, need for chest tube, inability to clear stone burden, renal laceration, arterial venous fistula or malformation, need for embolization of kidney, loss of kidney or renal function, need for repeat procedure, need for prolonged nephrostomy tube, ureteral avulsion, MI, CVA, PE and the inherent risks of general anesthesia.   - The patient would like to proceed with right ureteroscopic stone extraction - Urinalysis, Routine w reflex microscopic   No follow-ups on file.  [name], MD  [facility] [place]

## 2023-01-15 ENCOUNTER — Telehealth: Payer: Self-pay

## 2023-01-15 NOTE — Telephone Encounter (Signed)
I spoke with Ms. Seward Meth. We have discussed possible surgery dates and 01/30/2023 was agreed upon by all parties. Patient given information about surgery date, what to expect pre-operatively and post operatively.    We discussed that a pre-op nurse will be calling to set up the pre-op visit that will take place prior to surgery. Informed patient that our office will communicate any additional care to be provided after surgery.    Patients questions or concerns were discussed during our call. Advised to call our office should there be any additional information, questions or concerns that arise. Patient verbalized understanding.    Patient understands Eliquis hold pending approval from PCP

## 2023-01-20 ENCOUNTER — Encounter: Payer: Self-pay | Admitting: "Endocrinology

## 2023-01-22 ENCOUNTER — Other Ambulatory Visit: Payer: Self-pay

## 2023-01-22 DIAGNOSIS — N200 Calculus of kidney: Secondary | ICD-10-CM

## 2023-01-27 ENCOUNTER — Encounter (HOSPITAL_COMMUNITY): Payer: Self-pay

## 2023-01-27 ENCOUNTER — Encounter (HOSPITAL_COMMUNITY)
Admission: RE | Admit: 2023-01-27 | Discharge: 2023-01-27 | Disposition: A | Discharge status: Home / Self Care | Payer: Medicare HMO | Source: Ambulatory Visit | Attending: Urology | Admitting: Urology

## 2023-01-27 ENCOUNTER — Other Ambulatory Visit: Payer: Self-pay

## 2023-01-27 HISTORY — DX: Personal history of other diseases of the circulatory system: Z86.79

## 2023-01-30 ENCOUNTER — Encounter (HOSPITAL_COMMUNITY): Payer: Self-pay | Admitting: Urology

## 2023-01-30 ENCOUNTER — Ambulatory Visit (HOSPITAL_COMMUNITY)
Admission: RE | Admit: 2023-01-30 | Discharge: 2023-01-30 | Disposition: A | Discharge status: Home / Self Care | Payer: Medicare HMO | Attending: Urology | Admitting: Urology

## 2023-01-30 ENCOUNTER — Ambulatory Visit (HOSPITAL_COMMUNITY): Payer: Medicare HMO | Admitting: Anesthesiology

## 2023-01-30 ENCOUNTER — Ambulatory Visit (HOSPITAL_BASED_OUTPATIENT_CLINIC_OR_DEPARTMENT_OTHER): Payer: Medicare HMO | Admitting: Anesthesiology

## 2023-01-30 ENCOUNTER — Ambulatory Visit (HOSPITAL_COMMUNITY): Payer: Medicare HMO

## 2023-01-30 ENCOUNTER — Encounter (HOSPITAL_COMMUNITY)
Admission: RE | Disposition: A | Discharge status: Home / Self Care | Payer: Self-pay | Source: Home / Self Care | Attending: Urology

## 2023-01-30 DIAGNOSIS — Z7984 Long term (current) use of oral hypoglycemic drugs: Secondary | ICD-10-CM

## 2023-01-30 DIAGNOSIS — I130 Hypertensive heart and chronic kidney disease with heart failure and stage 1 through stage 4 chronic kidney disease, or unspecified chronic kidney disease: Secondary | ICD-10-CM | POA: Diagnosis not present

## 2023-01-30 DIAGNOSIS — I509 Heart failure, unspecified: Secondary | ICD-10-CM | POA: Diagnosis not present

## 2023-01-30 DIAGNOSIS — N189 Chronic kidney disease, unspecified: Secondary | ICD-10-CM | POA: Insufficient documentation

## 2023-01-30 DIAGNOSIS — K219 Gastro-esophageal reflux disease without esophagitis: Secondary | ICD-10-CM | POA: Diagnosis not present

## 2023-01-30 DIAGNOSIS — E119 Type 2 diabetes mellitus without complications: Secondary | ICD-10-CM

## 2023-01-30 DIAGNOSIS — N200 Calculus of kidney: Secondary | ICD-10-CM | POA: Diagnosis present

## 2023-01-30 DIAGNOSIS — M329 Systemic lupus erythematosus, unspecified: Secondary | ICD-10-CM | POA: Diagnosis not present

## 2023-01-30 DIAGNOSIS — E1122 Type 2 diabetes mellitus with diabetic chronic kidney disease: Secondary | ICD-10-CM | POA: Insufficient documentation

## 2023-01-30 DIAGNOSIS — N201 Calculus of ureter: Secondary | ICD-10-CM

## 2023-01-30 HISTORY — PX: CYSTOSCOPY WITH RETROGRADE PYELOGRAM, URETEROSCOPY AND STENT PLACEMENT: SHX5789

## 2023-01-30 HISTORY — PX: HOLMIUM LASER APPLICATION: SHX5852

## 2023-01-30 LAB — GLUCOSE, CAPILLARY
Glucose-Capillary: 69 mg/dL — ABNORMAL LOW (ref 70–99)
Glucose-Capillary: 102 mg/dL — ABNORMAL HIGH (ref 70–99)
Glucose-Capillary: 87 mg/dL (ref 70–99)
Glucose-Capillary: 91 mg/dL (ref 70–99)

## 2023-01-30 SURGERY — CYSTOURETEROSCOPY, WITH RETROGRADE PYELOGRAM AND STENT INSERTION
Anesthesia: General | Site: Vagina | Laterality: Right

## 2023-01-30 MED ORDER — HYDROMORPHONE HCL 1 MG/ML IJ SOLN: 0.2500 mg | INTRAMUSCULAR | Status: DC | PRN

## 2023-01-30 MED ORDER — MEPERIDINE HCL 50 MG/ML IJ SOLN: 6.2500 mg | INTRAMUSCULAR | Status: DC | PRN

## 2023-01-30 MED ORDER — SUCCINYLCHOLINE CHLORIDE 200 MG/10ML IV SOSY
PREFILLED_SYRINGE | INTRAVENOUS | Status: DC | PRN
  Administered 2023-01-30: 120 mg via INTRAVENOUS

## 2023-01-30 MED ORDER — DEXTROSE 50 % IV SOLN: 25.0000 mL | Freq: Once | INTRAVENOUS | Status: AC

## 2023-01-30 MED ORDER — LIDOCAINE HCL (PF) 2 % IJ SOLN
INTRAMUSCULAR | Status: AC
  Filled 2023-01-30: qty 5

## 2023-01-30 MED ORDER — ONDANSETRON HCL 4 MG/2ML IJ SOLN
INTRAMUSCULAR | Status: AC
  Filled 2023-01-30: qty 2

## 2023-01-30 MED ORDER — CHLORHEXIDINE GLUCONATE 0.12 % MT SOLN
OROMUCOSAL | Status: AC
  Filled 2023-01-30: qty 15

## 2023-01-30 MED ORDER — SUGAMMADEX SODIUM 200 MG/2ML IV SOLN
INTRAVENOUS | Status: DC | PRN
  Administered 2023-01-30: 200 mg via INTRAVENOUS

## 2023-01-30 MED ORDER — LACTATED RINGERS IV SOLN: INTRAVENOUS | Status: DC

## 2023-01-30 MED ORDER — DIATRIZOATE MEGLUMINE 30 % UR SOLN
URETHRAL | Status: DC | PRN
  Administered 2023-01-30: 10 mL via URETHRAL

## 2023-01-30 MED ORDER — DEXTROSE 50 % IV SOLN
25.0000 mL | Freq: Once | INTRAVENOUS | Status: AC
  Administered 2023-01-30: 25 mL via INTRAVENOUS

## 2023-01-30 MED ORDER — FENTANYL CITRATE (PF) 250 MCG/5ML IJ SOLN
INTRAMUSCULAR | Status: DC | PRN
  Administered 2023-01-30 (×2): 50 ug via INTRAVENOUS

## 2023-01-30 MED ORDER — CEFAZOLIN SODIUM-DEXTROSE 2-4 GM/100ML-% IV SOLN
INTRAVENOUS | Status: AC
  Filled 2023-01-30: qty 100

## 2023-01-30 MED ORDER — PHENYLEPHRINE 80 MCG/ML (10ML) SYRINGE FOR IV PUSH (FOR BLOOD PRESSURE SUPPORT)
PREFILLED_SYRINGE | INTRAVENOUS | Status: AC
  Filled 2023-01-30: qty 10

## 2023-01-30 MED ORDER — ONDANSETRON 4 MG PO TBDP: 4.0000 mg | ORAL_TABLET | Freq: Three times a day (TID) | ORAL | 0 refills | Status: DC | PRN

## 2023-01-30 MED ORDER — DEXAMETHASONE SODIUM PHOSPHATE 4 MG/ML IJ SOLN
INTRAMUSCULAR | Status: DC | PRN
  Administered 2023-01-30: 10 mg via INTRAVENOUS

## 2023-01-30 MED ORDER — ROCURONIUM BROMIDE 10 MG/ML (PF) SYRINGE
PREFILLED_SYRINGE | INTRAVENOUS | Status: DC | PRN
  Administered 2023-01-30: 40 mg via INTRAVENOUS

## 2023-01-30 MED ORDER — FENTANYL CITRATE (PF) 100 MCG/2ML IJ SOLN
INTRAMUSCULAR | Status: AC
  Filled 2023-01-30: qty 2

## 2023-01-30 MED ORDER — ROCURONIUM BROMIDE 10 MG/ML (PF) SYRINGE
PREFILLED_SYRINGE | INTRAVENOUS | Status: AC
  Filled 2023-01-30: qty 10

## 2023-01-30 MED ORDER — CHLORHEXIDINE GLUCONATE 0.12 % MT SOLN
15.0000 mL | Freq: Once | OROMUCOSAL | Status: AC
  Administered 2023-01-30: 15 mL via OROMUCOSAL

## 2023-01-30 MED ORDER — HYDROCODONE-ACETAMINOPHEN 5-325 MG PO TABS: 1.0000 | ORAL_TABLET | Freq: Four times a day (QID) | ORAL | 0 refills | Status: DC | PRN

## 2023-01-30 MED ORDER — MIDAZOLAM HCL 2 MG/2ML IJ SOLN
INTRAMUSCULAR | Status: AC
  Filled 2023-01-30: qty 2

## 2023-01-30 MED ORDER — CEFAZOLIN SODIUM-DEXTROSE 2-4 GM/100ML-% IV SOLN
2.0000 g | INTRAVENOUS | Status: AC
  Administered 2023-01-30: 2 g via INTRAVENOUS

## 2023-01-30 MED ORDER — DEXTROSE 50 % IV SOLN
INTRAVENOUS | Status: AC
  Administered 2023-01-30: 25 mL via INTRAVENOUS
  Filled 2023-01-30: qty 50

## 2023-01-30 MED ORDER — ONDANSETRON HCL 4 MG/2ML IJ SOLN: 4.0000 mg | Freq: Once | INTRAMUSCULAR | Status: DC | PRN

## 2023-01-30 MED ORDER — PROPOFOL 10 MG/ML IV BOLUS
INTRAVENOUS | Status: AC
  Filled 2023-01-30: qty 20

## 2023-01-30 MED ORDER — MIDAZOLAM HCL 2 MG/2ML IJ SOLN
INTRAMUSCULAR | Status: DC | PRN
  Administered 2023-01-30: 2 mg via INTRAVENOUS

## 2023-01-30 MED ORDER — DIATRIZOATE MEGLUMINE 30 % UR SOLN
URETHRAL | Status: AC
  Filled 2023-01-30: qty 100

## 2023-01-30 MED ORDER — ORAL CARE MOUTH RINSE: 15.0000 mL | Freq: Once | OROMUCOSAL | Status: AC

## 2023-01-30 MED ORDER — ONDANSETRON HCL 4 MG/2ML IJ SOLN
INTRAMUSCULAR | Status: DC | PRN
  Administered 2023-01-30: 4 mg via INTRAVENOUS

## 2023-01-30 MED ORDER — SODIUM CHLORIDE 0.9 % IR SOLN
Status: DC | PRN
  Administered 2023-01-30 (×2): 3000 mL

## 2023-01-30 MED ORDER — SUCCINYLCHOLINE CHLORIDE 200 MG/10ML IV SOSY
PREFILLED_SYRINGE | INTRAVENOUS | Status: AC
  Filled 2023-01-30: qty 10

## 2023-01-30 MED ORDER — DEXAMETHASONE SODIUM PHOSPHATE 10 MG/ML IJ SOLN
INTRAMUSCULAR | Status: AC
  Filled 2023-01-30: qty 1

## 2023-01-30 MED ORDER — LIDOCAINE 2% (20 MG/ML) 5 ML SYRINGE
INTRAMUSCULAR | Status: DC | PRN
  Administered 2023-01-30: 100 mg via INTRAVENOUS

## 2023-01-30 MED ORDER — PHENYLEPHRINE 80 MCG/ML (10ML) SYRINGE FOR IV PUSH (FOR BLOOD PRESSURE SUPPORT)
PREFILLED_SYRINGE | INTRAVENOUS | Status: DC | PRN
  Administered 2023-01-30 (×2): 160 ug via INTRAVENOUS

## 2023-01-30 MED ORDER — PROPOFOL 10 MG/ML IV BOLUS
INTRAVENOUS | Status: DC | PRN
  Administered 2023-01-30: 150 mg via INTRAVENOUS

## 2023-01-30 SURGICAL SUPPLY — 28 items
BAG DRAIN URO TABLE W/ADPT NS (BAG) ×2 IMPLANT
BAG DRN 8 ADPR NS SKTRN CSTL (BAG) ×2
BAG HAMPER (MISCELLANEOUS) ×2 IMPLANT
CATH INTERMIT  6FR 70CM (CATHETERS) ×2 IMPLANT
CLOTH BEACON ORANGE TIMEOUT ST (SAFETY) ×2 IMPLANT
DECANTER SPIKE VIAL GLASS SM (MISCELLANEOUS) ×2 IMPLANT
EXTRACTOR STONE NITINOL NGAGE (UROLOGICAL SUPPLIES) IMPLANT
GLOVE BIO SURGEON STRL SZ8 (GLOVE) ×2 IMPLANT
GLOVE BIOGEL PI IND STRL 7.0 (GLOVE) ×4 IMPLANT
GOWN STRL REUS W/TWL LRG LVL3 (GOWN DISPOSABLE) ×2 IMPLANT
GOWN STRL REUS W/TWL XL LVL3 (GOWN DISPOSABLE) ×2 IMPLANT
GUIDEWIRE STR DUAL SENSOR (WIRE) ×2 IMPLANT
GUIDEWIRE STR ZIPWIRE 035X150 (MISCELLANEOUS) ×2 IMPLANT
IV NS IRRIG 3000ML ARTHROMATIC (IV SOLUTION) ×4 IMPLANT
KIT TURNOVER CYSTO (KITS) ×2 IMPLANT
MANIFOLD NEPTUNE II (INSTRUMENTS) ×2 IMPLANT
PACK CYSTO (CUSTOM PROCEDURE TRAY) ×2 IMPLANT
PAD ARMBOARD 7.5X6 YLW CONV (MISCELLANEOUS) ×2 IMPLANT
SHEATH NAVIGATOR HD 11/13X36 (SHEATH) IMPLANT
SHEATH URETERAL 12FRX35CM (MISCELLANEOUS) IMPLANT
STENT URET 6FRX24 CONTOUR (STENTS) IMPLANT
STENT URET 6FRX26 CONTOUR (STENTS) IMPLANT
SYR 10ML LL (SYRINGE) ×2 IMPLANT
SYR CONTROL 10ML LL (SYRINGE) ×2 IMPLANT
TOWEL OR 17X26 4PK STRL BLUE (TOWEL DISPOSABLE) ×2 IMPLANT
TRACTIP FLEXIVA PULS ID 200XHI (Laser) IMPLANT
TRACTIP FLEXIVA PULSE ID 200 (Laser) ×2
WATER STERILE IRR 500ML POUR (IV SOLUTION) ×2 IMPLANT

## 2023-01-30 NOTE — Anesthesia Procedure Notes (Signed)
Procedure Name: Intubation Date/Time: 01/30/2023 1:27 PM  Performed by: Julian Reil, CRNAPre-anesthesia Checklist: Patient identified, Emergency Drugs available, Suction available and Patient being monitored Patient Re-evaluated:Patient Re-evaluated prior to induction Oxygen Delivery Method: Circle system utilized Preoxygenation: Pre-oxygenation with 100% oxygen Induction Type: IV induction, Rapid sequence and Cricoid Pressure applied Laryngoscope Size: Glidescope and 3 Grade View: Grade I Tube type: Oral Tube size: 7.0 mm Number of attempts: 1 Airway Equipment and Method: Stylet and Oral airway Placement Confirmation: ETT inserted through vocal cords under direct vision, positive ETCO2 and breath sounds checked- equal and bilateral Secured at: 22 cm Tube secured with: Tape Dental Injury: Teeth and Oropharynx as per pre-operative assessment  Comments: 4x4s bite block used.

## 2023-01-30 NOTE — Anesthesia Preprocedure Evaluation (Addendum)
Anesthesia Evaluation  Patient identified by MRN, date of birth, ID band Patient awake    Reviewed: Allergy & Precautions, H&P , NPO status , Patient's Chart, lab work & pertinent test results  Airway Mallampati: II  TM Distance: >3 FB Neck ROM: Full    Dental  (+) Dental Advisory Given, Chipped,    Pulmonary shortness of breath and with exertion   Pulmonary exam normal breath sounds clear to auscultation       Cardiovascular Exercise Tolerance: Good +CHF and + DVT  Normal cardiovascular exam Rhythm:Regular Rate:Normal  27-Nov-2022 08:30:26 [facility] ROUTINE RECORD 1956-08-09 (66 yr) Female Caucasian Vent. rate 69 BPM PR interval 147 ms QRS duration 90 ms QT/QTcB 377/404 ms P-R-T axes 66 21 36 Sinus rhythm Low voltage, precordial leads Confirmed by Benjiman Core [id number]) on 11/27/2022 9:45:36 AM  Left ventricle: The cavity size was normal. Wall thickness was    normal. Doppler parameters are consistent with abnormal left    ventricular relaxation (grade 1 diastolic dysfunction).  - Mitral valve: There was mild regurgitation.     Neuro/Psych  Headaches  negative psych ROS   GI/Hepatic Neg liver ROS,GERD  Medicated and Poorly Controlled,,  Endo/Other  diabetes, Well Controlled, Type 2, Oral Hypoglycemic Agents    Renal/GU Renal InsufficiencyRenal disease  negative genitourinary   Musculoskeletal negative musculoskeletal ROS (+)    Abdominal   Peds negative pediatric ROS (+)  Hematology negative hematology ROS (+)   Anesthesia Other Findings Lupus   Reproductive/Obstetrics negative OB ROS                             Anesthesia Physical Anesthesia Plan  ASA: 3  Anesthesia Plan: General   Post-op Pain Management: Dilaudid IV   Induction: Rapid sequence and Intravenous  PONV Risk Score and Plan: 4 or greater and Ondansetron and Dexamethasone  Airway  Management Planned: Oral ETT  Additional Equipment:   Intra-op Plan:   Post-operative Plan: Extubation in OR  Informed Consent: I have reviewed the patients History and Physical, chart, labs and discussed the procedure including the risks, benefits and alternatives for the proposed anesthesia with the patient or authorized representative who has indicated his/her understanding and acceptance.     Dental advisory given  Plan Discussed with: CRNA and Surgeon  Anesthesia Plan Comments:         Anesthesia Quick Evaluation

## 2023-01-30 NOTE — Transfer of Care (Signed)
Immediate Anesthesia Transfer of Care Note  Patient: Joy Leach  Procedure(s) Performed: CYSTOSCOPY WITH RETROGRADE PYELOGRAM, URETEROSCOPY AND STENT PLACEMENT (Right: Vagina ) HOLMIUM LASER APPLICATION (Right)  Patient Location: PACU  Anesthesia Type:General  Level of Consciousness: drowsy  Airway & Oxygen Therapy: Patient Spontanous Breathing and Patient connected to face mask oxygen  Post-op Assessment: Report given to RN and Post -op Vital signs reviewed and stable  Post vital signs: Reviewed and stable  Last Vitals:  Vitals Value Taken Time  BP 156/78 01/30/23 1408  Temp    Pulse 75 01/30/23 1410  Resp 23 01/30/23 1410  SpO2 99 % 01/30/23 1410  Vitals shown include unvalidated device data.  Last Pain:  Vitals:   01/30/23 1130  TempSrc: Oral  PainSc: 0-No pain      Patients Stated Pain Goal: 8 (01/30/23 1130)  Complications: No notable events documented.

## 2023-01-30 NOTE — Anesthesia Postprocedure Evaluation (Signed)
Anesthesia Post Note  Patient: Joy Leach  Procedure(s) Performed: CYSTOSCOPY WITH RETROGRADE PYELOGRAM, URETEROSCOPY AND STENT PLACEMENT (Right: Vagina ) HOLMIUM LASER APPLICATION (Right)  Patient location during evaluation: Phase II Anesthesia Type: General Level of consciousness: awake and alert and oriented Pain management: pain level controlled Vital Signs Assessment: post-procedure vital signs reviewed and stable Respiratory status: spontaneous breathing, nonlabored ventilation and respiratory function stable Cardiovascular status: blood pressure returned to baseline and stable Postop Assessment: no apparent nausea or vomiting Anesthetic complications: no  No notable events documented.   Last Vitals:  Vitals:   01/30/23 1430 01/30/23 1443  BP: 135/76 (!) 140/74  Pulse: 70 72  Resp: (!) 26 19  Temp:  36.4 C  SpO2: 96% 95%    Last Pain:  Vitals:   01/30/23 1443  TempSrc: Oral  PainSc: 0-No pain                 Hank Walling C Kirstina Leinweber

## 2023-01-30 NOTE — Interval H&P Note (Signed)
History and Physical Interval Note:  01/30/2023 12:56 PM  Joy Leach  has presented today for surgery, with the diagnosis of right ureteral calculus.  The various methods of treatment have been discussed with the patient and family. After consideration of risks, benefits and other options for treatment, the patient has consented to  Procedure(s): CYSTOSCOPY WITH RETROGRADE PYELOGRAM, URETEROSCOPY AND STENT PLACEMENT (Right) HOLMIUM LASER APPLICATION (Right) as a surgical intervention.  The patient's history has been reviewed, patient examined, no change in status, stable for surgery.  I have reviewed the patient's chart and labs.  Questions were answered to the patient's satisfaction.     Wilkie Aye

## 2023-01-30 NOTE — Op Note (Signed)
Preoperative diagnosis: Right renal stone  Postoperative diagnosis: Same  Procedure: 1 cystoscopy 2. Right retrograde pyelography 3.  Intraoperative fluoroscopy, under one hour, with interpretation 4.  Right ureteroscopic stone manipulation with laser lithotripsy 5.  right 6 x 24 JJ stent placement  Attending: Wilkie Aye  Anesthesia: General  Estimated blood loss: None  Drains: Right 6 x 26 JJ ureteral stent with tether  Specimens: stone for analysis  Antibiotics: ancef  Findings: Right Upper pole stone. No hydronephrosis. No masses/lesions in the bladder. Ureteral orifices in normal anatomic location.  Indications: Patient is a 67 year old female with a history of right renal stone and who has persistent right flank pain.  After discussing treatment options, she decided proceed with right ureteroscopic stone manipulation.  Procedure in detail: The patient was brought to the operating room and a brief timeout was done to ensure correct patient, correct procedure, correct site.  General anesthesia was administered patient was placed in dorsal lithotomy position.  Her genitalia was then prepped and draped in usual sterile fashion.  A rigid 22 French cystoscope was passed in the urethra and the bladder.  Bladder was inspected free masses or lesions.  the ureteral orifices were in the normal orthotopic locations.  a 6 french ureteral catheter was then instilled into the right ureteral orifice.  a gentle retrograde was obtained and findings noted above.  we then placed a zip wire through the ureteral catheter and advanced up to the renal pelvis.  we then removed the cystoscope and cannulated the right ureteral orifice with a semirigid ureteroscope.  No stone was found in the ureter. Once we reached the UPJ a sensor wire was advanced in to the renal pelvis. We then removed the ureteroscope and advanced am 11/13 x 35cm access sheath up to the renal pelvis. We then used the flexible  ureteroscope to perform nephroscopy. We encountered the stone in the upper pole. Using a 242nm laser fiber the stone was fragmented and the fragments were removed with a Ngage basket.    once all stone fragments were removed we then removed the access sheath under direct vision and noted no injury to the ureter. We then placed a 6 x 24 double-j ureteral stent over the original zip wire.  We then removed the wire and good coil was noted in the the renal pelvis under fluoroscopy and the bladder under direct vision. the bladder was then drained and this concluded the procedure which was well tolerated by patient.  Complications: None  Condition: Stable, extubated, transferred to PACU  Plan: Patient is to be discharged home as to follow-up in one week. She is to remove her stent in 72 hours by pulling the tether

## 2023-02-04 ENCOUNTER — Encounter (HOSPITAL_COMMUNITY): Payer: Self-pay | Admitting: Urology

## 2023-02-04 ENCOUNTER — Encounter: Payer: Medicare HMO | Admitting: Urology

## 2023-02-07 LAB — CALCULI, WITH PHOTOGRAPH (CLINICAL LAB)
Calcium Oxalate Dihydrate: 20 %
Calcium Oxalate Monohydrate: 80 %
Weight Calculi: 20 mg

## 2023-02-14 ENCOUNTER — Ambulatory Visit: Payer: Medicare HMO | Admitting: Urology

## 2023-02-19 ENCOUNTER — Encounter: Payer: Self-pay | Admitting: Urology

## 2023-02-19 ENCOUNTER — Ambulatory Visit: Payer: Medicare HMO | Admitting: Urology

## 2023-02-19 VITALS — BP 113/73 | HR 92

## 2023-02-19 DIAGNOSIS — Z87442 Personal history of urinary calculi: Secondary | ICD-10-CM | POA: Diagnosis not present

## 2023-02-19 DIAGNOSIS — Z09 Encounter for follow-up examination after completed treatment for conditions other than malignant neoplasm: Secondary | ICD-10-CM | POA: Diagnosis not present

## 2023-02-19 DIAGNOSIS — N200 Calculus of kidney: Secondary | ICD-10-CM

## 2023-02-19 LAB — URINALYSIS, ROUTINE W REFLEX MICROSCOPIC
Bilirubin, UA: NEGATIVE
Glucose, UA: NEGATIVE
Ketones, UA: NEGATIVE
Leukocytes,UA: NEGATIVE
Nitrite, UA: NEGATIVE
RBC, UA: NEGATIVE
Specific Gravity, UA: 1.03 (ref 1.005–1.030)
Urobilinogen, Ur: 0.2 mg/dL (ref 0.2–1.0)
pH, UA: 5 (ref 5.0–7.5)

## 2023-02-19 LAB — MICROSCOPIC EXAMINATION: Bacteria, UA: NONE SEEN

## 2023-02-19 NOTE — Patient Instructions (Signed)

## 2023-02-19 NOTE — Progress Notes (Signed)
02/19/2023 2:04 PM   Joy Leach 15-Aug-1956 [id number]  Referring provider: Pollyann Savoy, NP [address] [place],  [place] [address]  nephrolithiasis   HPI: Joy Leach is a 66yo here for followup for nephrolithiasis. She is doing well after right ureteroscopy and stone extraction. She removed her stent POD#3. She has lower back pain. No worsening LUTS. NO hematuria or dysuria. She has a hx of hypercalcemia.    PMH: Past Medical History:  Diagnosis Date   Chronic kidney disease    Diabetes mellitus without complication (HCC)    H/O blood clots    Headache    History of hypertension    Hyperlipidemia    Lupus (HCC)    Mycobacterial disease    Sicca (HCC)     Surgical History: Past Surgical History:  Procedure Laterality Date   ABDOMINAL HYSTERECTOMY     BIOPSY  10/26/2021   Procedure: BIOPSY;  Surgeon: Dolores Frame, MD;  [facility];  Service: Gastroenterology;;   BIOPSY  04/02/2022   Procedure: BIOPSY;  Surgeon: Dolores Frame, MD;  [facility];  Service: Gastroenterology;;   COLONOSCOPY  05/26/2015   Dr Teena Dunk, mild diverticulosis in sigmoid colon, biosies taken [facility] and sigmoid colon-negative   COLONOSCOPY WITH PROPOFOL N/A 04/02/2022   Procedure: COLONOSCOPY WITH PROPOFOL;  Surgeon: Dolores Frame, MD;  [facility];  Service: Gastroenterology;  Laterality: N/A;  815   CYSTOSCOPY WITH RETROGRADE PYELOGRAM, URETEROSCOPY AND STENT PLACEMENT Right 01/30/2023   Procedure: CYSTOSCOPY WITH RETROGRADE PYELOGRAM, URETEROSCOPY AND STENT PLACEMENT;  Surgeon: Malen Gauze, MD;  Location: [facility];  Service: Urology;  Laterality: Right;   ESOPHAGOGASTRODUODENOSCOPY (EGD) WITH PROPOFOL N/A 10/26/2021   Procedure: ESOPHAGOGASTRODUODENOSCOPY (EGD) WITH PROPOFOL;  Surgeon: Dolores Frame, MD;  [facility];  Service: Gastroenterology;  Laterality: N/A;  1045   EXTRACORPOREAL  SHOCK WAVE LITHOTRIPSY Right 12/03/2022   Procedure: EXTRACORPOREAL SHOCK WAVE LITHOTRIPSY (ESWL);  Surgeon: Malen Gauze, MD;  [facility];  Service: Urology;  Laterality: Right;   HEMOSTASIS CLIP PLACEMENT  10/26/2021   Procedure: HEMOSTASIS CLIP PLACEMENT;  Surgeon: Dolores Frame, MD;  [facility];  Service: Gastroenterology;;   HOLMIUM LASER APPLICATION Right 01/30/2023   Procedure: HOLMIUM LASER APPLICATION;  Surgeon: Malen Gauze, MD;  Location[facility];  Service: Urology;  Laterality: Right;   HOT HEMOSTASIS  10/26/2021   Procedure: HOT HEMOSTASIS (ARGON PLASMA COAGULATION/BICAP);  Surgeon: Marguerita Merles, Reuel Boom, MD;  [facility];  Service: Gastroenterology;;   hystorectomy     POLYPECTOMY  10/26/2021   Procedure: POLYPECTOMY;  Surgeon: Dolores Frame, MD;  [facility];  Service: Gastroenterology;;   POLYPECTOMY  04/02/2022   Procedure: POLYPECTOMY;  Surgeon: Dolores Frame, MD;  [facility];  Service: Gastroenterology;;   SINUS SURGERY WITH INSTATRAK  2015    Home Medications:  Allergies as of 02/19/2023       Reactions   Sulfa Antibiotics Hives   Caffeine Other (See Comments)   Bladder infections   Codeine Nausea And Vomiting        Medication List        Accurate as of Feb 19, 2023  2:04 PM. If you have any questions, ask your nurse or doctor.          albuterol 108 (90 Base) MCG/ACT inhaler Commonly known as: VENTOLIN HFA Inhale 1 puff into the lungs every 6 (six)  hours as needed for shortness of breath.   alfuzosin 10 MG 24 hr tablet Commonly known as: UROXATRAL Take 1 tablet (10 mg total) by mouth daily with breakfast.   apixaban 5 MG Tabs tablet Commonly known as: ELIQUIS Take 5 mg by mouth 2 (two) times daily.   azelastine 0.1 % nasal spray Commonly known as: ASTELIN Place 1 spray into both nostrils daily.   azithromycin 250 MG tablet Commonly known  as: ZITHROMAX Take 250 mg by mouth daily.   ethambutol 400 MG tablet Commonly known as: MYAMBUTOL Take 1,200 mg by mouth every Monday, Wednesday, and Friday.   ezetimibe 10 MG tablet Commonly known as: ZETIA Take 10 mg by mouth daily.   fenofibrate 145 MG tablet Commonly known as: TRICOR Take 145 mg by mouth every evening.   ferrous sulfate 325 (65 FE) MG EC tablet Take 325 mg by mouth daily with breakfast.   furosemide 20 MG tablet Commonly known as: LASIX TAKE (1) TABLET BY MOUTH ONCE DAILY AS NEEDED. What changed: See the new instructions.   HYDROcodone-acetaminophen 5-325 MG tablet Commonly known as: NORCO/VICODIN Take 1-2 tablets by mouth every 6 (six) hours as needed for moderate pain or severe pain.   hydrocortisone 2.5 % rectal cream Commonly known as: ANUSOL-HC Place 1 Application rectally 4 (four) times daily. Use four times per day x10 days then PRN thereafter What changed:  when to take this reasons to take this additional instructions   imiquimod 5 % cream Commonly known as: ALDARA Apply 1 Application topically 3 (three) times a week.   Janumet 50-1000 MG tablet Generic drug: sitaGLIPtin-metformin Take 1 tablet by mouth 2 (two) times daily.   loperamide 2 MG capsule Commonly known as: IMODIUM Take 2 mg by mouth as needed for diarrhea or loose stools.   meclizine 25 MG tablet Commonly known as: ANTIVERT Take 25 mg by mouth 3 (three) times daily as needed for dizziness or nausea.   multivitamin tablet Take 1 tablet by mouth in the morning.   ondansetron 4 MG disintegrating tablet Commonly known as: ZOFRAN-ODT Take 1 tablet (4 mg total) by mouth every 8 (eight) hours as needed for nausea or vomiting.   pantoprazole 40 MG tablet Commonly known as: PROTONIX TAKE ONE TABLET BY MOUTH EVERY DAY   pioglitazone 15 MG tablet Commonly known as: ACTOS Take 15 mg by mouth daily.   predniSONE 5 MG Tbec Take 5 mg by mouth daily with breakfast.    pregabalin 75 MG capsule Commonly known as: LYRICA Take 75 mg by mouth every 8 (eight) hours.   Turmeric 450 MG Caps Take 450 mg by mouth at bedtime.        Allergies:  Allergies  Allergen Reactions   Sulfa Antibiotics Hives   Caffeine Other (See Comments)    Bladder infections   Codeine Nausea And Vomiting    Family History: Family History  Problem Relation Age of Onset   Hypertension Mother    Diabetes Mother    Kidney disease Mother    Heart disease Mother    High Cholesterol Mother    Lung cancer Mother    Hypertension Father    Diabetes Father    Kidney disease Father    Heart disease Father     Social History:  reports that she has never smoked. She has never been exposed to tobacco smoke. She has never used smokeless tobacco. She reports that she does not drink alcohol and does not use drugs.  ROS:  All other review of systems were reviewed and are negative except what is noted above in HPI  Physical Exam: BP 113/73   Pulse 92   Constitutional:  Alert and oriented, No acute distress. HEENT: Joy Leach AT, moist mucus membranes.  Trachea midline, no masses. Cardiovascular: No clubbing, cyanosis, or edema. Respiratory: Normal respiratory effort, no increased work of breathing. GI: Abdomen is soft, nontender, nondistended, no abdominal masses GU: No CVA tenderness.  Lymph: No cervical or inguinal lymphadenopathy. Skin: No rashes, bruises or suspicious lesions. Neurologic: Grossly intact, no focal deficits, moving all 4 extremities. Psychiatric: Normal mood and affect.  Laboratory Data: Lab Results  Component Value Date   WBC 5.9 11/27/2022   HGB 11.7 (L) 11/27/2022   HCT 36.1 11/27/2022   MCV 87.0 11/27/2022   PLT 179 11/27/2022    Lab Results  Component Value Date   CREATININE 1.72 (H) 11/27/2022    No results found for: "PSA"  No results found for: "TESTOSTERONE"  No results found for: "HGBA1C"  Urinalysis    Component Value Date/Time    COLORURINE YELLOW 11/27/2022 1010   APPEARANCEUR Clear 01/03/2023 1214   LABSPEC 1.015 11/27/2022 1010   PHURINE 8.0 11/27/2022 1010   GLUCOSEU Negative 01/03/2023 1214   HGBUR LARGE (A) 11/27/2022 1010   BILIRUBINUR Negative 01/03/2023 1214   KETONESUR NEGATIVE 11/27/2022 1010   PROTEINUR Negative 01/03/2023 1214   PROTEINUR 30 (A) 11/27/2022 1010   NITRITE Negative 01/03/2023 1214   NITRITE NEGATIVE 11/27/2022 1010   LEUKOCYTESUR Trace (A) 01/03/2023 1214   LEUKOCYTESUR TRACE (A) 11/27/2022 1010    Lab Results  Component Value Date   LABMICR Comment 01/03/2023   BACTERIA RARE (A) 11/27/2022    Pertinent Imaging:  Results for orders placed during the hospital encounter of 01/03/23  Abdomen 1 view (KUB)  Narrative CLINICAL DATA:  Nephrolithiasis, fall LEFT  EXAM: ABDOMEN - 1 VIEW  COMPARISON:  12/20/2022  FINDINGS: Two RIGHT renal calculi identified, measuring 6 mm and 4 mm projecting over inferior pole.  Faint visualization of a 4 mm inferior pole LEFT renal calculus.  No ureteral calcifications.  Prominent stool in RIGHT and LEFT colon, less transverse colon and rectum.  No osseous abnormalities.  IMPRESSION: BILATERAL renal calculi.   Electronically Signed By: Ulyses Southward M.D. On: 01/03/2023 17:10  No results found for this or any previous visit.  No results found for this or any previous visit.  No results found for this or any previous visit.  No results found for this or any previous visit.  No valid procedures specified. No results found for this or any previous visit.  No results found for this or any previous visit.   Assessment & Plan:    1. Kidney stones -dietary handout given -followup 6 weeks with renal US - Urinalysis, Routine w reflex microscopic   No follow-ups on file.  Joy Aye, MD  [facility] [place]

## 2023-02-20 ENCOUNTER — Encounter: Payer: Self-pay | Admitting: "Endocrinology

## 2023-02-20 ENCOUNTER — Ambulatory Visit: Payer: Medicare HMO | Admitting: "Endocrinology

## 2023-02-20 VITALS — BP 112/72 | HR 72 | Ht 65.0 in | Wt 181.4 lb

## 2023-02-20 DIAGNOSIS — E212 Other hyperparathyroidism: Secondary | ICD-10-CM

## 2023-02-20 NOTE — Progress Notes (Signed)
Endocrinology Consult Note       02/20/2023, 5:16 PM  Joy Leach is a 67 y.o.-year-old female, referred by her  Chapell, Waldron Session, NP  , for evaluation for hypercalcemia/hyperparathyroidism.   Past Medical History:  Diagnosis Date   Chronic kidney disease    Diabetes mellitus without complication (HCC)    H/O blood clots    Headache    History of hypertension    Hyperlipidemia    Lupus (HCC)    Mycobacterial disease    Sicca (HCC)     Past Surgical History:  Procedure Laterality Date   ABDOMINAL HYSTERECTOMY     BIOPSY  10/26/2021   Procedure: BIOPSY;  Surgeon: Dolores Frame, MD;  [facility];  Service: Gastroenterology;;   BIOPSY  04/02/2022   Procedure: BIOPSY;  Surgeon: Dolores Frame, MD;  [facility]: [facility];  Service: Gastroenterology;;   COLONOSCOPY  05/26/2015   Dr Teena Dunk, mild diverticulosis in sigmoid colon, biosies taken from [facility] and sigmoid colon-negative   COLONOSCOPY WITH PROPOFOL N/A 04/02/2022   Procedure: COLONOSCOPY WITH PROPOFOL;  Surgeon: Dolores Frame, MD;  [facility];  Service: Gastroenterology;  Laterality: N/A;  815   CYSTOSCOPY WITH RETROGRADE PYELOGRAM, URETEROSCOPY AND STENT PLACEMENT Right 01/30/2023   Procedure: CYSTOSCOPY WITH RETROGRADE PYELOGRAM, URETEROSCOPY AND STENT PLACEMENT;  Surgeon: Malen Gauze, MD;  Location: [facility];  Service: Urology;  Laterality: Right;   ESOPHAGOGASTRODUODENOSCOPY (EGD) WITH PROPOFOL N/A 10/26/2021   Procedure: ESOPHAGOGASTRODUODENOSCOPY (EGD) WITH PROPOFOL;  Surgeon: Dolores Frame, MD;  [facility];  Service: Gastroenterology;  Laterality: N/A;  1045   EXTRACORPOREAL SHOCK WAVE LITHOTRIPSY Right 12/03/2022   Procedure: EXTRACORPOREAL SHOCK WAVE LITHOTRIPSY (ESWL);  Surgeon: Malen Gauze, MD;  Location: [facility];  Service: Urology;  Laterality: Right;   HEMOSTASIS CLIP  PLACEMENT  10/26/2021   Procedure: HEMOSTASIS CLIP PLACEMENT;  Surgeon: Dolores Frame, MD;  [facility];  Service: Gastroenterology;;   HOLMIUM LASER APPLICATION Right 01/30/2023   Procedure: HOLMIUM LASER APPLICATION;  Surgeon: Malen Gauze, MD;  Location: [facility];  Service: Urology;  Laterality: Right;   HOT HEMOSTASIS  10/26/2021   Procedure: HOT HEMOSTASIS (ARGON PLASMA COAGULATION/BICAP);  Surgeon: Marguerita Merles, Reuel Boom, MD;  [facility];  Service: Gastroenterology;;   hystorectomy     POLYPECTOMY  10/26/2021   Procedure: POLYPECTOMY;  Surgeon: Dolores Frame, MD;  [facility];  Service: Gastroenterology;;   POLYPECTOMY  04/02/2022   Procedure: POLYPECTOMY;  Surgeon: Dolores Frame, MD;  [facility];  Service: Gastroenterology;;   SINUS SURGERY WITH INSTATRAK  2015    Social History   Tobacco Use   Smoking status: Never    Passive exposure: Never   Smokeless tobacco: Never  Vaping Use   Vaping Use: Never used  Substance Use Topics   Alcohol use: No   Drug use: No    Family History  Problem Relation Age of Onset   Hypertension Mother    Diabetes Mother    Kidney disease Mother    Heart disease Mother    High Cholesterol Mother  Lung cancer Mother    Hypertension Father    Diabetes Father    Kidney disease Father    Heart disease Father     Outpatient Encounter Medications as of 02/20/2023  Medication Sig   linezolid (ZYVOX) 600 MG tablet Take 600 mg by mouth daily.   albuterol (VENTOLIN HFA) 108 (90 Base) MCG/ACT inhaler Inhale 1 puff into the lungs every 6 (six) hours as needed for shortness of breath.   alfuzosin (UROXATRAL) 10 MG 24 hr tablet Take 1 tablet (10 mg total) by mouth daily with breakfast.   apixaban (ELIQUIS) 5 MG TABS tablet Take 5 mg by mouth 2 (two) times daily.   azelastine (ASTELIN) 0.1 % nasal spray Place 1 spray into both nostrils daily.   azithromycin  (ZITHROMAX) 250 MG tablet Take 250 mg by mouth daily.   ethambutol (MYAMBUTOL) 400 MG tablet Take 1,200 mg by mouth every Monday, Wednesday, and Friday.   ezetimibe (ZETIA) 10 MG tablet Take 10 mg by mouth daily.   fenofibrate (TRICOR) 145 MG tablet Take 145 mg by mouth every evening.   ferrous sulfate 325 (65 FE) MG EC tablet Take 325 mg by mouth daily with breakfast.   furosemide (LASIX) 20 MG tablet TAKE (1) TABLET BY MOUTH ONCE DAILY AS NEEDED. (Patient taking differently: Take 5 mg by mouth daily as needed (leg swelling). Half of 10 mg)   HYDROcodone-acetaminophen (NORCO/VICODIN) 5-325 MG tablet Take 1-2 tablets by mouth every 6 (six) hours as needed for moderate pain or severe pain.   hydrocortisone (ANUSOL-HC) 2.5 % rectal cream Place 1 Application rectally 4 (four) times daily. Use four times per day x10 days then PRN thereafter (Patient taking differently: Place 1 Application rectally daily as needed for hemorrhoids or anal itching.)   imiquimod (ALDARA) 5 % cream Apply 1 Application topically 3 (three) times a week.   JANUMET 50-1000 MG tablet Take 1 tablet by mouth 2 (two) times daily.   loperamide (IMODIUM) 2 MG capsule Take 2 mg by mouth as needed for diarrhea or loose stools.   meclizine (ANTIVERT) 25 MG tablet Take 25 mg by mouth 3 (three) times daily as needed for dizziness or nausea.   Multiple Vitamin (MULTIVITAMIN) tablet Take 1 tablet by mouth in the morning.   ondansetron (ZOFRAN-ODT) 4 MG disintegrating tablet Take 1 tablet (4 mg total) by mouth every 8 (eight) hours as needed for nausea or vomiting.   pantoprazole (PROTONIX) 40 MG tablet TAKE ONE TABLET BY MOUTH EVERY DAY   pioglitazone (ACTOS) 15 MG tablet Take 15 mg by mouth daily.   predniSONE 5 MG TBEC Take 5 mg by mouth daily with breakfast.   pregabalin (LYRICA) 75 MG capsule Take 75 mg by mouth every 8 (eight) hours.   Turmeric 450 MG CAPS Take 450 mg by mouth at bedtime.   No facility-administered encounter  medications on file as of 02/20/2023.    Allergies  Allergen Reactions   Sulfa Antibiotics Hives   Caffeine Other (See Comments)    Bladder infections   Codeine Nausea And Vomiting     HPI  Joy Leach was diagnosed with elevated PTH of 88 on December 06, 2022 associated with mild hypercalcemia.  Recent high calcium of 10.9 was reported.  Her complete records are not available to review.  Patient has no previously known history of parathyroid, pituitary, adrenal dysfunctions; no family history of such dysfunctions. She did have a recent bone density at her rheumatologist.  T-score of the hips were -  2.0 consistent with osteopenia.  She is accompanied by her sister to clinic.  She has no new complaints.  No prior history of fragility fractures or falls.  She is unsure of her remote past history of nephrolithiasis. -She has stage 2-3 CKD.  Her last BUN was 21, creatinine 1.7.  She does not report any height loss. She does not control bladder, wears pads and reporting urge incontinence.   she is not on HCTZ or other thiazide therapy.  Her vitamin D status is not known.  She has type 2 diabetes on Actos, Janumet.  She also has hyperlipidemia on Tricor. she is not on calcium supplements,  she eats dairy and green, leafy, vegetables on average amounts.  she does not have a family history of hypercalcemia, pituitary tumors, thyroid cancer, or osteoporosis.    ROS:  Constitutional: + Minimally fluctuating body weight,  no fatigue, no subjective hyperthermia, no subjective hypothermia Eyes: no blurry vision, no xerophthalmia ENT: no sore throat, no nodules palpated in throat, no dysphagia/odynophagia, no hoarseness Cardiovascular: no Chest Pain, no Shortness of Breath, no palpitations, no leg swelling Respiratory: no cough, no shortness of breath  Gastrointestinal: no Nausea/Vomiting/Diarhhea Musculoskeletal: no muscle/joint aches Skin: no rashes Neurological: no tremors, no numbness, no  tingling, no dizziness Psychiatric: no depression, no anxiety  PE: BP 112/72   Pulse 72   Ht 5\' 5"  (1.651 m)   Wt 181 lb 6.4 oz (82.3 kg)   BMI 30.19 kg/m , Body mass index is 30.19 kg/m. Wt Readings from Last 3 Encounters:  02/20/23 181 lb 6.4 oz (82.3 kg)  01/30/23 179 lb 0.2 oz (81.2 kg)  01/27/23 179 lb 0.2 oz (81.2 kg)    Constitutional: + BMI of 30.19, not in acute distress, normal state of mind Eyes: PERRLA, EOMI, no exophthalmos ENT: moist mucous membranes, no gross thyromegaly, no gross cervical lymphadenopathy Cardiovascular: normal precordial activity, Regular Rate and Rhythm, no Murmur/Rubs/Gallops Respiratory:  adequate breathing efforts, no gross chest deformity, Clear to auscultation bilaterally Gastrointestinal: abdomen soft, Non -tender, No distension, Bowel Sounds present Musculoskeletal: no gross deformities, strength intact in all four extremities Skin: moist, warm, no rashes Neurological: no tremor with outstretched hands, Deep tendon reflexes normal in bilateral lower extremities.     CMP ( most recent) CMP     Component Value Date/Time   NA 139 11/27/2022 0838   NA 143 03/11/2018 1034   K 3.5 11/27/2022 0838   CL 104 11/27/2022 0838   CO2 26 11/27/2022 0838   GLUCOSE 97 11/27/2022 0838   BUN 17 11/27/2022 0838   BUN 18 03/11/2018 1034   CREATININE 1.72 (H) 11/27/2022 0838   CREATININE 1.06 (H) 08/20/2016 1422   CALCIUM 10.9 (H) 11/27/2022 0838   PROT 6.6 11/27/2022 0838   PROT 6.8 03/11/2018 1034   ALBUMIN 3.9 11/27/2022 0838   ALBUMIN 4.5 03/11/2018 1034   AST 26 11/27/2022 0838   ALT 15 11/27/2022 0838   ALKPHOS 99 11/27/2022 0838   BILITOT 0.8 11/27/2022 0838   BILITOT 1.3 (H) 03/11/2018 1034   GFRNONAA 33 (L) 11/27/2022 0838   GFRAA 73 03/11/2018 1034      Lab Results  Component Value Date   TSH 1.13 07/12/2016      Assessment: 1.  Elevated PTH of 88 on the background of CKD  2.  Hypercalcemia of 10.9  Plan: Patient has had  at least 1 instance of elevated calcium at 10.9 associated with high PTH of 88.   -She  has osteopenia, and remote past history of nephrolithiasis. -Denies history of fragility fractures. No abdominal pain, no major mood disorders, no bone pain.  - I discussed with the patient about the physiology of calcium and parathyroid hormone, and possible  effects of  increased PTH/ Calcium , including kidney stones, cardiac dysrhythmias, osteoporosis, abdominal pain, etc.   - The work up so far is not sufficient to reach a conclusion for definitive therapy.  she  needs more studies to confirm and classify the parathyroid dysfunction she may have. I will proceed to obtain  repeat intact PTH/calcium, serum magnesium, serum phosphorus.  She will also need PTH RP measured.   Unfortunately, it is not practical to collect 24-hour urine sample in this patient with urgent continence.  Her DEXA scan was reviewed showing osteopenia of hips.  -Although there is a possibility of secondary hyperparathyroidism in the background of CKD, this patient may need treatment with Sensipar once her workup is complete.  She will return in 7 to 10 days days for reevaluation.  She is advised to maintain close follow-up with her PMD.  - Time spent with the patient: 45 minutes, of which >50% was spent in obtaining information about her symptoms, reviewing her previous labs, evaluations, and treatments, counseling her about her hypercalcemia, elevated PTH, and developing a plan to confirm the diagnosis and long term treatment as necessary.  Please refer to " Patient Self Inventory" in the Media  tab for reviewed elements of pertinent patient history.  [name] participated in the discussions, expressed understanding, and voiced agreement with the above plans.  All questions were answered to her satisfaction. she is encouraged to contact clinic should she have any questions or concerns prior to her return visit.  - Return in about  10 days (around 03/02/2023), or Obtain a Copy of her recent DXA scan results., for Labs Today- Non-Fasting Ok.   Marquis Lunch, MD [facility] [address] [place], [place] [address] Phone: [phone]  Fax: [phone]    This note was partially dictated with voice recognition software. Similar sounding words can be transcribed inadequately or may not  be corrected upon review.  02/20/2023, 5:16 PM

## 2023-02-21 DIAGNOSIS — E210 Primary hyperparathyroidism: Secondary | ICD-10-CM | POA: Insufficient documentation

## 2023-02-21 DIAGNOSIS — E212 Other hyperparathyroidism: Secondary | ICD-10-CM | POA: Insufficient documentation

## 2023-02-21 LAB — PTH, INTACT AND CALCIUM: Calcium: 10.7 mg/dL — ABNORMAL HIGH (ref 8.7–10.3)

## 2023-02-24 LAB — PTH-RELATED PEPTIDE: PTH-related peptide: <2 pmol/L

## 2023-02-24 LAB — PTH, INTACT AND CALCIUM: PTH: 58 pg/mL (ref 15–65)

## 2023-02-24 LAB — PHOSPHORUS: Phosphorus: 2.8 mg/dL — ABNORMAL LOW (ref 3.0–4.3)

## 2023-02-24 LAB — MAGNESIUM: Magnesium: 2.1 mg/dL (ref 1.6–2.3)

## 2023-03-05 ENCOUNTER — Encounter (HOSPITAL_COMMUNITY): Payer: Self-pay

## 2023-03-05 ENCOUNTER — Inpatient Hospital Stay (HOSPITAL_COMMUNITY)
Admission: EM | Admit: 2023-03-05 | Discharge: 2023-03-09 | DRG: 418 | Disposition: A | Discharge status: Other Acute Inpatient Hospital | Payer: Medicare HMO | Attending: Internal Medicine | Admitting: Internal Medicine

## 2023-03-05 ENCOUNTER — Other Ambulatory Visit: Payer: Self-pay

## 2023-03-05 DIAGNOSIS — M069 Rheumatoid arthritis, unspecified: Secondary | ICD-10-CM | POA: Diagnosis present

## 2023-03-05 DIAGNOSIS — E1122 Type 2 diabetes mellitus with diabetic chronic kidney disease: Secondary | ICD-10-CM | POA: Diagnosis present

## 2023-03-05 DIAGNOSIS — Z6830 Body mass index (BMI) 30.0-30.9, adult: Secondary | ICD-10-CM

## 2023-03-05 DIAGNOSIS — Z882 Allergy status to sulfonamides status: Secondary | ICD-10-CM

## 2023-03-05 DIAGNOSIS — Z83438 Family history of other disorder of lipoprotein metabolism and other lipidemia: Secondary | ICD-10-CM

## 2023-03-05 DIAGNOSIS — R1084 Generalized abdominal pain: Principal | ICD-10-CM

## 2023-03-05 DIAGNOSIS — Z841 Family history of disorders of kidney and ureter: Secondary | ICD-10-CM

## 2023-03-05 DIAGNOSIS — K8061 Calculus of gallbladder and bile duct with cholecystitis, unspecified, with obstruction: Principal | ICD-10-CM | POA: Diagnosis present

## 2023-03-05 DIAGNOSIS — R7401 Elevation of levels of liver transaminase levels: Secondary | ICD-10-CM | POA: Diagnosis present

## 2023-03-05 DIAGNOSIS — K838 Other specified diseases of biliary tract: Secondary | ICD-10-CM | POA: Diagnosis present

## 2023-03-05 DIAGNOSIS — E11649 Type 2 diabetes mellitus with hypoglycemia without coma: Secondary | ICD-10-CM | POA: Diagnosis not present

## 2023-03-05 DIAGNOSIS — E785 Hyperlipidemia, unspecified: Secondary | ICD-10-CM | POA: Diagnosis present

## 2023-03-05 DIAGNOSIS — R1010 Upper abdominal pain, unspecified: Secondary | ICD-10-CM

## 2023-03-05 DIAGNOSIS — Z5309 Procedure and treatment not carried out because of other contraindication: Secondary | ICD-10-CM | POA: Diagnosis present

## 2023-03-05 DIAGNOSIS — I82409 Acute embolism and thrombosis of unspecified deep veins of unspecified lower extremity: Secondary | ICD-10-CM | POA: Insufficient documentation

## 2023-03-05 DIAGNOSIS — Z86718 Personal history of other venous thrombosis and embolism: Secondary | ICD-10-CM

## 2023-03-05 DIAGNOSIS — E119 Type 2 diabetes mellitus without complications: Secondary | ICD-10-CM

## 2023-03-05 DIAGNOSIS — K219 Gastro-esophageal reflux disease without esophagitis: Secondary | ICD-10-CM | POA: Diagnosis present

## 2023-03-05 DIAGNOSIS — Z7984 Long term (current) use of oral hypoglycemic drugs: Secondary | ICD-10-CM

## 2023-03-05 DIAGNOSIS — Z833 Family history of diabetes mellitus: Secondary | ICD-10-CM

## 2023-03-05 DIAGNOSIS — N200 Calculus of kidney: Secondary | ICD-10-CM | POA: Diagnosis present

## 2023-03-05 DIAGNOSIS — N1832 Chronic kidney disease, stage 3b: Secondary | ICD-10-CM | POA: Diagnosis present

## 2023-03-05 DIAGNOSIS — Z90710 Acquired absence of both cervix and uterus: Secondary | ICD-10-CM

## 2023-03-05 DIAGNOSIS — Z7901 Long term (current) use of anticoagulants: Secondary | ICD-10-CM

## 2023-03-05 DIAGNOSIS — I129 Hypertensive chronic kidney disease with stage 1 through stage 4 chronic kidney disease, or unspecified chronic kidney disease: Secondary | ICD-10-CM | POA: Diagnosis present

## 2023-03-05 DIAGNOSIS — A319 Mycobacterial infection, unspecified: Secondary | ICD-10-CM | POA: Diagnosis present

## 2023-03-05 DIAGNOSIS — Z801 Family history of malignant neoplasm of trachea, bronchus and lung: Secondary | ICD-10-CM

## 2023-03-05 DIAGNOSIS — Z8249 Family history of ischemic heart disease and other diseases of the circulatory system: Secondary | ICD-10-CM

## 2023-03-05 DIAGNOSIS — E669 Obesity, unspecified: Secondary | ICD-10-CM | POA: Diagnosis present

## 2023-03-05 DIAGNOSIS — R1011 Right upper quadrant pain: Secondary | ICD-10-CM

## 2023-03-05 DIAGNOSIS — M329 Systemic lupus erythematosus, unspecified: Secondary | ICD-10-CM | POA: Diagnosis present

## 2023-03-05 LAB — URINALYSIS, ROUTINE W REFLEX MICROSCOPIC
Bilirubin Urine: NEGATIVE
Glucose, UA: NEGATIVE mg/dL
Hgb urine dipstick: NEGATIVE
Ketones, ur: NEGATIVE mg/dL
Nitrite: NEGATIVE
Protein, ur: 30 mg/dL — AB
Specific Gravity, Urine: 1.023 (ref 1.005–1.030)
pH: 7 (ref 5.0–8.0)

## 2023-03-05 MED ORDER — ONDANSETRON HCL 4 MG/2ML IJ SOLN
4.0000 mg | Freq: Once | INTRAMUSCULAR | Status: AC
  Administered 2023-03-06: 4 mg via INTRAVENOUS
  Filled 2023-03-05: qty 2

## 2023-03-05 MED ORDER — KETOROLAC TROMETHAMINE 15 MG/ML IJ SOLN
10.0000 mg | Freq: Once | INTRAMUSCULAR | Status: AC
  Administered 2023-03-06: 10 mg via INTRAVENOUS
  Filled 2023-03-05: qty 1

## 2023-03-05 MED ORDER — SODIUM CHLORIDE 0.9 % IV BOLUS
500.0000 mL | Freq: Once | INTRAVENOUS | Status: AC
  Administered 2023-03-06: 500 mL via INTRAVENOUS

## 2023-03-05 NOTE — ED Triage Notes (Signed)
Pt arrived via POV from home c/o persistent left flank pain X 1 week and recently had "kidney stone surgery on the right side" a month ago, but reports scans showed "kidney stones on the left side too."

## 2023-03-06 ENCOUNTER — Observation Stay (HOSPITAL_COMMUNITY): Payer: Medicare HMO

## 2023-03-06 ENCOUNTER — Emergency Department (HOSPITAL_COMMUNITY): Payer: Medicare HMO

## 2023-03-06 ENCOUNTER — Other Ambulatory Visit (HOSPITAL_COMMUNITY): Payer: Medicare HMO

## 2023-03-06 ENCOUNTER — Ambulatory Visit: Payer: Medicare HMO | Admitting: "Endocrinology

## 2023-03-06 DIAGNOSIS — K8020 Calculus of gallbladder without cholecystitis without obstruction: Secondary | ICD-10-CM | POA: Diagnosis not present

## 2023-03-06 DIAGNOSIS — Z83438 Family history of other disorder of lipoprotein metabolism and other lipidemia: Secondary | ICD-10-CM | POA: Diagnosis not present

## 2023-03-06 DIAGNOSIS — K219 Gastro-esophageal reflux disease without esophagitis: Secondary | ICD-10-CM | POA: Diagnosis present

## 2023-03-06 DIAGNOSIS — Z6830 Body mass index (BMI) 30.0-30.9, adult: Secondary | ICD-10-CM | POA: Diagnosis not present

## 2023-03-06 DIAGNOSIS — Z882 Allergy status to sulfonamides status: Secondary | ICD-10-CM | POA: Diagnosis not present

## 2023-03-06 DIAGNOSIS — A319 Mycobacterial infection, unspecified: Secondary | ICD-10-CM | POA: Diagnosis not present

## 2023-03-06 DIAGNOSIS — M069 Rheumatoid arthritis, unspecified: Secondary | ICD-10-CM | POA: Diagnosis present

## 2023-03-06 DIAGNOSIS — I82409 Acute embolism and thrombosis of unspecified deep veins of unspecified lower extremity: Secondary | ICD-10-CM | POA: Insufficient documentation

## 2023-03-06 DIAGNOSIS — R1084 Generalized abdominal pain: Secondary | ICD-10-CM | POA: Diagnosis present

## 2023-03-06 DIAGNOSIS — R1011 Right upper quadrant pain: Secondary | ICD-10-CM | POA: Diagnosis present

## 2023-03-06 DIAGNOSIS — Z833 Family history of diabetes mellitus: Secondary | ICD-10-CM | POA: Diagnosis not present

## 2023-03-06 DIAGNOSIS — E119 Type 2 diabetes mellitus without complications: Secondary | ICD-10-CM | POA: Diagnosis not present

## 2023-03-06 DIAGNOSIS — Z7984 Long term (current) use of oral hypoglycemic drugs: Secondary | ICD-10-CM | POA: Diagnosis not present

## 2023-03-06 DIAGNOSIS — Z7901 Long term (current) use of anticoagulants: Secondary | ICD-10-CM | POA: Diagnosis not present

## 2023-03-06 DIAGNOSIS — Z86718 Personal history of other venous thrombosis and embolism: Secondary | ICD-10-CM | POA: Diagnosis not present

## 2023-03-06 DIAGNOSIS — Z841 Family history of disorders of kidney and ureter: Secondary | ICD-10-CM | POA: Diagnosis not present

## 2023-03-06 DIAGNOSIS — E11649 Type 2 diabetes mellitus with hypoglycemia without coma: Secondary | ICD-10-CM | POA: Diagnosis not present

## 2023-03-06 DIAGNOSIS — E785 Hyperlipidemia, unspecified: Secondary | ICD-10-CM | POA: Diagnosis present

## 2023-03-06 DIAGNOSIS — Z8249 Family history of ischemic heart disease and other diseases of the circulatory system: Secondary | ICD-10-CM | POA: Diagnosis not present

## 2023-03-06 DIAGNOSIS — R7401 Elevation of levels of liver transaminase levels: Secondary | ICD-10-CM | POA: Diagnosis present

## 2023-03-06 DIAGNOSIS — M329 Systemic lupus erythematosus, unspecified: Secondary | ICD-10-CM | POA: Diagnosis not present

## 2023-03-06 DIAGNOSIS — Z801 Family history of malignant neoplasm of trachea, bronchus and lung: Secondary | ICD-10-CM | POA: Diagnosis not present

## 2023-03-06 DIAGNOSIS — E1122 Type 2 diabetes mellitus with diabetic chronic kidney disease: Secondary | ICD-10-CM | POA: Diagnosis present

## 2023-03-06 DIAGNOSIS — E669 Obesity, unspecified: Secondary | ICD-10-CM | POA: Diagnosis present

## 2023-03-06 DIAGNOSIS — N200 Calculus of kidney: Secondary | ICD-10-CM | POA: Diagnosis present

## 2023-03-06 DIAGNOSIS — I129 Hypertensive chronic kidney disease with stage 1 through stage 4 chronic kidney disease, or unspecified chronic kidney disease: Secondary | ICD-10-CM | POA: Diagnosis present

## 2023-03-06 DIAGNOSIS — N289 Disorder of kidney and ureter, unspecified: Secondary | ICD-10-CM | POA: Diagnosis not present

## 2023-03-06 DIAGNOSIS — Z90710 Acquired absence of both cervix and uterus: Secondary | ICD-10-CM | POA: Diagnosis not present

## 2023-03-06 DIAGNOSIS — N1832 Chronic kidney disease, stage 3b: Secondary | ICD-10-CM | POA: Diagnosis present

## 2023-03-06 DIAGNOSIS — K8061 Calculus of gallbladder and bile duct with cholecystitis, unspecified, with obstruction: Secondary | ICD-10-CM | POA: Diagnosis present

## 2023-03-06 DIAGNOSIS — E782 Mixed hyperlipidemia: Secondary | ICD-10-CM

## 2023-03-06 DIAGNOSIS — K838 Other specified diseases of biliary tract: Secondary | ICD-10-CM | POA: Diagnosis present

## 2023-03-06 LAB — BASIC METABOLIC PANEL
Anion gap: 10 (ref 5–15)
BUN: 28 mg/dL — ABNORMAL HIGH (ref 8–23)
CO2: 26 mmol/L (ref 22–32)
Calcium: 10.6 mg/dL — ABNORMAL HIGH (ref 8.9–10.3)
Chloride: 103 mmol/L (ref 98–111)
Creatinine, Ser: 1.49 mg/dL — ABNORMAL HIGH (ref 0.44–1.00)
GFR, Estimated: 39 mL/min — ABNORMAL LOW (ref >60)
Glucose, Bld: 110 mg/dL — ABNORMAL HIGH (ref 70–99)
Potassium: 3.8 mmol/L (ref 3.5–5.1)
Sodium: 139 mmol/L (ref 135–145)

## 2023-03-06 LAB — HEPATIC FUNCTION PANEL
ALT: 64 U/L — ABNORMAL HIGH (ref 0–44)
AST: 286 U/L — ABNORMAL HIGH (ref 15–41)
Albumin: 4.4 g/dL (ref 3.5–5.0)
Alkaline Phosphatase: 106 U/L (ref 38–126)
Bilirubin, Direct: 0.7 mg/dL — ABNORMAL HIGH (ref 0.0–0.2)
Indirect Bilirubin: 0.5 mg/dL (ref 0.3–0.9)
Total Bilirubin: 1.2 mg/dL (ref 0.3–1.2)
Total Protein: 7.2 g/dL (ref 6.5–8.1)

## 2023-03-06 LAB — GLUCOSE, CAPILLARY
Glucose-Capillary: 82 mg/dL (ref 70–99)
Glucose-Capillary: 67 mg/dL — ABNORMAL LOW (ref 70–99)
Glucose-Capillary: 80 mg/dL (ref 70–99)
Glucose-Capillary: 78 mg/dL (ref 70–99)

## 2023-03-06 LAB — CBC WITH DIFFERENTIAL/PLATELET
Abs Immature Granulocytes: 0.05 10*3/uL (ref 0.00–0.07)
Basophils Absolute: 0 10*3/uL (ref 0.0–0.1)
Basophils Relative: 0 %
Eosinophils Absolute: 0.1 10*3/uL (ref 0.0–0.5)
Eosinophils Relative: 1 %
HCT: 35.3 % — ABNORMAL LOW (ref 36.0–46.0)
Hemoglobin: 11.1 g/dL — ABNORMAL LOW (ref 12.0–15.0)
Immature Granulocytes: 1 %
Lymphocytes Relative: 10 %
Lymphs Abs: 1 10*3/uL (ref 0.7–4.0)
MCH: 31.3 pg (ref 26.0–34.0)
MCHC: 31.4 g/dL (ref 30.0–36.0)
MCV: 99.4 fL (ref 80.0–100.0)
Monocytes Absolute: 0.9 10*3/uL (ref 0.1–1.0)
Monocytes Relative: 9 %
Neutro Abs: 8.2 10*3/uL — ABNORMAL HIGH (ref 1.7–7.7)
Neutrophils Relative %: 79 %
Platelets: 250 10*3/uL (ref 150–400)
RBC: 3.55 MIL/uL — ABNORMAL LOW (ref 3.87–5.11)
RDW: 15.6 % — ABNORMAL HIGH (ref 11.5–15.5)
WBC: 10.3 10*3/uL (ref 4.0–10.5)
nRBC: 0 % (ref 0.0–0.2)

## 2023-03-06 LAB — LIPASE, BLOOD: Lipase: 83 U/L — ABNORMAL HIGH (ref 11–51)

## 2023-03-06 MED ORDER — ETHAMBUTOL HCL 400 MG PO TABS
1200.0000 mg | ORAL_TABLET | ORAL | Status: DC
  Administered 2023-03-07: 1200 mg via ORAL
  Filled 2023-03-06 (×2): qty 3

## 2023-03-06 MED ORDER — IPRATROPIUM-ALBUTEROL 0.5-2.5 (3) MG/3ML IN SOLN: 3.0000 mL | Freq: Four times a day (QID) | RESPIRATORY_TRACT | Status: DC | PRN

## 2023-03-06 MED ORDER — POLYVINYL ALCOHOL 1.4 % OP SOLN
1.0000 [drp] | OPHTHALMIC | Status: DC | PRN
  Administered 2023-03-06: 1 [drp] via OPHTHALMIC
  Filled 2023-03-06: qty 15

## 2023-03-06 MED ORDER — METRONIDAZOLE 500 MG/100ML IV SOLN
500.0000 mg | Freq: Once | INTRAVENOUS | Status: AC
  Administered 2023-03-06: 500 mg via INTRAVENOUS
  Filled 2023-03-06: qty 100

## 2023-03-06 MED ORDER — PREDNISONE 5 MG PO TABS
5.0000 mg | ORAL_TABLET | Freq: Every day | ORAL | Status: DC
  Administered 2023-03-07 – 2023-03-09 (×3): 5 mg via ORAL
  Filled 2023-03-06 (×3): qty 1

## 2023-03-06 MED ORDER — TECHNETIUM TC 99M MEBROFENIN IV KIT
5.2000 | PACK | Freq: Once | INTRAVENOUS | Status: AC | PRN
  Administered 2023-03-06: 5.2 via INTRAVENOUS

## 2023-03-06 MED ORDER — ONDANSETRON HCL 4 MG/2ML IJ SOLN
4.0000 mg | Freq: Four times a day (QID) | INTRAMUSCULAR | Status: DC | PRN
  Administered 2023-03-06 (×2): 4 mg via INTRAVENOUS
  Filled 2023-03-06 (×3): qty 2

## 2023-03-06 MED ORDER — LACTATED RINGERS IV SOLN: INTRAVENOUS | Status: AC

## 2023-03-06 MED ORDER — INSULIN GLARGINE-YFGN 100 UNIT/ML ~~LOC~~ SOLN
5.0000 [IU] | Freq: Every day | SUBCUTANEOUS | Status: DC
  Administered 2023-03-07: 5 [IU] via SUBCUTANEOUS
  Filled 2023-03-06 (×3): qty 0.05

## 2023-03-06 MED ORDER — ONDANSETRON HCL 4 MG PO TABS: 4.0000 mg | ORAL_TABLET | Freq: Four times a day (QID) | ORAL | Status: DC | PRN

## 2023-03-06 MED ORDER — AZITHROMYCIN 250 MG PO TABS
250.0000 mg | ORAL_TABLET | Freq: Every day | ORAL | Status: DC
  Administered 2023-03-06 – 2023-03-09 (×4): 250 mg via ORAL
  Filled 2023-03-06 (×4): qty 1

## 2023-03-06 MED ORDER — ALFUZOSIN HCL ER 10 MG PO TB24
10.0000 mg | ORAL_TABLET | Freq: Every day | ORAL | Status: DC
  Administered 2023-03-07 – 2023-03-09 (×3): 10 mg via ORAL
  Filled 2023-03-06 (×3): qty 1

## 2023-03-06 MED ORDER — ACETAMINOPHEN 650 MG RE SUPP: 650.0000 mg | Freq: Four times a day (QID) | RECTAL | Status: DC | PRN

## 2023-03-06 MED ORDER — INSULIN ASPART 100 UNIT/ML IJ SOLN
0.0000 [IU] | Freq: Three times a day (TID) | INTRAMUSCULAR | Status: DC
  Administered 2023-03-08: 2 [IU] via SUBCUTANEOUS
  Administered 2023-03-09: 1 [IU] via SUBCUTANEOUS
  Administered 2023-03-09: 2 [IU] via SUBCUTANEOUS

## 2023-03-06 MED ORDER — HYDROCORTISONE (PERIANAL) 2.5 % EX CREA: 1.0000 | TOPICAL_CREAM | Freq: Every day | CUTANEOUS | Status: DC | PRN

## 2023-03-06 MED ORDER — PANTOPRAZOLE SODIUM 40 MG PO TBEC
40.0000 mg | DELAYED_RELEASE_TABLET | Freq: Two times a day (BID) | ORAL | Status: DC
  Administered 2023-03-06 – 2023-03-09 (×6): 40 mg via ORAL
  Filled 2023-03-06 (×7): qty 1

## 2023-03-06 MED ORDER — ACETAMINOPHEN 325 MG PO TABS
650.0000 mg | ORAL_TABLET | Freq: Four times a day (QID) | ORAL | Status: DC | PRN
  Administered 2023-03-06: 650 mg via ORAL
  Filled 2023-03-06: qty 2

## 2023-03-06 MED ORDER — MORPHINE SULFATE (PF) 4 MG/ML IV SOLN
4.0000 mg | INTRAVENOUS | Status: DC | PRN
  Administered 2023-03-06 (×2): 4 mg via INTRAVENOUS
  Filled 2023-03-06 (×2): qty 1

## 2023-03-06 MED ORDER — PREGABALIN 75 MG PO CAPS
75.0000 mg | ORAL_CAPSULE | Freq: Three times a day (TID) | ORAL | Status: DC
  Administered 2023-03-06 – 2023-03-09 (×9): 75 mg via ORAL
  Filled 2023-03-06 (×9): qty 1

## 2023-03-06 MED ORDER — ONDANSETRON HCL 4 MG/2ML IJ SOLN
4.0000 mg | Freq: Four times a day (QID) | INTRAMUSCULAR | Status: DC | PRN
  Administered 2023-03-07 – 2023-03-08 (×2): 4 mg via INTRAVENOUS
  Filled 2023-03-06: qty 2

## 2023-03-06 MED ORDER — HEPARIN SODIUM (PORCINE) 5000 UNIT/ML IJ SOLN
5000.0000 [IU] | Freq: Three times a day (TID) | INTRAMUSCULAR | Status: DC
  Administered 2023-03-06 – 2023-03-09 (×9): 5000 [IU] via SUBCUTANEOUS
  Filled 2023-03-06 (×9): qty 1

## 2023-03-06 MED ORDER — HYDROMORPHONE HCL 1 MG/ML IJ SOLN
1.0000 mg | INTRAMUSCULAR | Status: DC | PRN
  Administered 2023-03-07 – 2023-03-09 (×3): 1 mg via INTRAVENOUS
  Filled 2023-03-06 (×4): qty 1

## 2023-03-06 MED ORDER — LINEZOLID 600 MG PO TABS
600.0000 mg | ORAL_TABLET | Freq: Every day | ORAL | Status: DC
  Administered 2023-03-06 – 2023-03-09 (×4): 600 mg via ORAL
  Filled 2023-03-06 (×5): qty 1

## 2023-03-06 MED ORDER — STERILE WATER FOR INJECTION IJ SOLN
INTRAMUSCULAR | Status: AC
  Filled 2023-03-06: qty 10

## 2023-03-06 MED ORDER — SODIUM CHLORIDE 0.9 % IV SOLN
2.0000 g | Freq: Once | INTRAVENOUS | Status: AC
  Administered 2023-03-06: 2 g via INTRAVENOUS
  Filled 2023-03-06: qty 20

## 2023-03-06 MED ORDER — SINCALIDE 5 MCG IJ SOLR
INTRAMUSCULAR | Status: AC
  Filled 2023-03-06: qty 5

## 2023-03-06 NOTE — ED Provider Notes (Signed)
Care assumed from Dr. Rhunette Croft, patient with abdominal pain and CT scan showing, cholelithiasis and gallbladder wall thickening and questionable stones in the cystic duct.  She is pending lipase and hepatic function studies.  Gallbladder ultrasound has been ordered for the morning.  I have reviewed hepatic function panel and lipase and my interpretation is mildly elevated lipase and ALT, moderately elevated AST and minimally elevated direct bilirubin.  These are all concerning for biliary obstruction.  Ultrasound is still pending.  I am ordering antibiotics for possible cholecystitis.  Case is signed out to Dr. Estell Harpin to evaluate ultrasound report.   Dione Booze, MD 03/06/23 [phone]

## 2023-03-06 NOTE — Assessment & Plan Note (Signed)
Continue with antiacid therapy.  

## 2023-03-06 NOTE — ED Notes (Signed)
Gave report to Mallory.

## 2023-03-06 NOTE — Assessment & Plan Note (Signed)
-  History of lupus -Continue outpatient follow-up with rheumatology service -Continue chronic daily use of prednisone. -Continue as needed analgesics and supportive care.

## 2023-03-06 NOTE — Assessment & Plan Note (Addendum)
-  Planning to resume the use of zetia and Tricor at discharge -Heart healthy diet discussed with patient

## 2023-03-06 NOTE — H&P (Signed)
History and Physical    Patient: Joy Leach BJY:[id number] DOB: [birth year] DOA: 03/05/2023 DOS: the patient was seen and examined on 03/06/2023 PCP: Chapell, Waldron Session, NP  Patient coming from: Home  Chief Complaint:  Chief Complaint  Patient presents with   Flank Pain   HPI: Joy Leach is a 67 y.o. 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  Symptom has been present for the last 24 to 48 hours and worsening; patient reported 1 episode of vomiting at home.   No fever, no chest pain, no shortness of breath, no sick contacts, denies dysuria, hematuria, focal weaknesses and any other complaints.    Patient reports good compliance with her medication.  Workup in the ED demonstrated CT abdomen and pelvis with bilateral nephrolithiasis without evidence of obstructive uropathy, cholelithiasis with gallbladder wall thickening, mild transaminitis and mild elevation in her lipase.  Review of Systems: As mentioned in the history of present illness. All other systems reviewed and are negative. Past Medical History:  Diagnosis Date   Chronic kidney disease    Diabetes mellitus without complication (HCC)    H/O blood clots    Headache    History of hypertension    Hyperlipidemia    Lupus (HCC)    Mycobacterial disease    Sicca (HCC)    Past Surgical History:  Procedure Laterality Date   ABDOMINAL HYSTERECTOMY     BIOPSY  10/26/2021   Procedure: BIOPSY;  Surgeon: Dolores Frame, MD;  [facility]: [facility];  Service: Gastroenterology;;   BIOPSY  04/02/2022    Procedure: BIOPSY;  Surgeon: Dolores Frame, MD;  [facility]: [facility];  Service: Gastroenterology;;   COLONOSCOPY  05/26/2015   Dr Teena Dunk, mild diverticulosis in sigmoid colon, biosies taken from [facility] and sigmoid colon-negative   COLONOSCOPY WITH PROPOFOL N/A 04/02/2022   Procedure: COLONOSCOPY WITH PROPOFOL;  Surgeon: Dolores Frame, MD;  [facility]: [facility];  Service: Gastroenterology;  Laterality: N/A;  815   CYSTOSCOPY WITH RETROGRADE PYELOGRAM, URETEROSCOPY AND STENT PLACEMENT Right 01/30/2023   Procedure: CYSTOSCOPY WITH RETROGRADE PYELOGRAM, URETEROSCOPY AND STENT PLACEMENT;  Surgeon: Malen Gauze, MD;  Location: [facility];  Service: Urology;  Laterality: Right;   ESOPHAGOGASTRODUODENOSCOPY (EGD) WITH PROPOFOL N/A 10/26/2021   Procedure: ESOPHAGOGASTRODUODENOSCOPY (EGD) WITH PROPOFOL;  Surgeon: Dolores Frame, MD;  [facility]: [facility];  Service: Gastroenterology;  Laterality: N/A;  1045   EXTRACORPOREAL SHOCK WAVE LITHOTRIPSY Right 12/03/2022   Procedure: EXTRACORPOREAL SHOCK WAVE LITHOTRIPSY (ESWL);  Surgeon: Malen Gauze, MD;  Location: [facility];  Service: Urology;  Laterality: Right;   HEMOSTASIS CLIP PLACEMENT  10/26/2021   Procedure: HEMOSTASIS CLIP PLACEMENT;  Surgeon: Dolores Frame, MD;  [facility];  Service: Gastroenterology;;   HOLMIUM LASER APPLICATION Right 01/30/2023   Procedure: HOLMIUM LASER APPLICATION;  Surgeon: Malen Gauze, MD;  Location: [facility];  Service: Urology;  Laterality: Right;   HOT HEMOSTASIS  10/26/2021   Procedure: HOT HEMOSTASIS (ARGON PLASMA COAGULATION/BICAP);  Surgeon: Marguerita Merles, Reuel Boom, MD;  Location: [facility];  Service: Gastroenterology;;   hystorectomy     POLYPECTOMY  10/26/2021   Procedure: POLYPECTOMY;  Surgeon: Levon Hedger  Alisia Ferrari, MD;  [facility]: [facility];  Service: Gastroenterology;;   POLYPECTOMY  04/02/2022   Procedure: POLYPECTOMY;  Surgeon:  Dolores Frame, MD;  [facility];  Service: Gastroenterology;;   SINUS SURGERY WITH INSTATRAK  2015   Social History:  reports that she has never smoked. She has never been exposed to tobacco smoke. She has never used smokeless tobacco. She reports that she does not drink alcohol and does not use drugs.  Allergies  Allergen Reactions   Sulfa Antibiotics Hives   Caffeine Other (See Comments)    Bladder infections   Codeine Nausea And Vomiting    Family History  Problem Relation Age of Onset   Hypertension Mother    Diabetes Mother    Kidney disease Mother    Heart disease Mother    High Cholesterol Mother    Lung cancer Mother    Hypertension Father    Diabetes Father    Kidney disease Father    Heart disease Father     Prior to Admission medications   Medication Sig Start Date End Date Taking? Authorizing Provider  albuterol (VENTOLIN HFA) 108 (90 Base) MCG/ACT inhaler Inhale 1 puff into the lungs every 6 (six) hours as needed for shortness of breath.   Yes [provider]  alfuzosin (UROXATRAL) 10 MG 24 hr tablet Take 1 tablet (10 mg total) by mouth daily with breakfast. 01/03/23  Yes McKenzie, Mardene Celeste, MD  apixaban (ELIQUIS) 5 MG TABS tablet Take 5 mg by mouth 2 (two) times daily. 03/26/21  Yes [provider]  azelastine (ASTELIN) 0.1 % nasal spray Place 1 spray into both nostrils daily. 01/14/23 01/14/24 Yes [provider]  azithromycin (ZITHROMAX) 250 MG tablet Take 250 mg by mouth daily. 02/19/22  Yes [provider]  ethambutol (MYAMBUTOL) 400 MG tablet Take 1,200 mg by mouth every Monday, Wednesday, and Friday.   Yes [provider]  ezetimibe (ZETIA) 10 MG tablet Take 10 mg by mouth daily.   Yes [provider]  fenofibrate (TRICOR) 145 MG tablet Take 145 mg by mouth every evening. 03/13/21  Yes [provider]  ferrous sulfate 325 (65 FE) MG EC tablet Take 325 mg by mouth daily with  breakfast.   Yes [provider]  furosemide (LASIX) 20 MG tablet TAKE (1) TABLET BY MOUTH ONCE DAILY AS NEEDED. Patient taking differently: Take 5 mg by mouth daily as needed (leg swelling). Half of 10 mg 06/19/17  Yes Jodelle Gross, NP  HYDROcodone-acetaminophen (NORCO/VICODIN) 5-325 MG tablet Take 1-2 tablets by mouth every 6 (six) hours as needed for moderate pain or severe pain. 01/30/23  Yes McKenzie, Mardene Celeste, MD  hydrocortisone (ANUSOL-HC) 2.5 % rectal cream Place 1 Application rectally 4 (four) times daily. Use four times per day x10 days then PRN thereafter Patient taking differently: Place 1 Application rectally daily as needed for hemorrhoids or anal itching. 06/24/22  Yes Carlan, Chelsea L, NP  imiquimod (ALDARA) 5 % cream Apply 1 Application topically 3 (three) times a week.   Yes [provider]  JANUMET 50-1000 MG tablet Take 1 tablet by mouth 2 (two) times daily. 04/24/21  Yes [provider]  linezolid (ZYVOX) 600 MG tablet Take 600 mg by mouth daily. 02/18/23 05/19/23 Yes [provider]  loperamide (IMODIUM) 2 MG capsule Take 2 mg by mouth as needed for diarrhea or loose stools.   Yes [provider]  meclizine (ANTIVERT) 25 MG tablet Take 25  mg by mouth 3 (three) times daily as needed for dizziness or nausea.   Yes [provider]  Multiple Vitamin (MULTIVITAMIN) tablet Take 1 tablet by mouth in the morning.   Yes [provider]  ondansetron (ZOFRAN-ODT) 4 MG disintegrating tablet Take 1 tablet (4 mg total) by mouth every 8 (eight) hours as needed for nausea or vomiting. 01/30/23  Yes McKenzie, Mardene Celeste, MD  pantoprazole (PROTONIX) 40 MG tablet TAKE ONE TABLET BY MOUTH EVERY DAY 11/01/22  Yes Carlan, Chelsea L, NP  pioglitazone (ACTOS) 15 MG tablet Take 15 mg by mouth daily. 02/25/22  Yes [provider]  predniSONE 5 MG TBEC Take 5 mg by mouth daily with breakfast.   Yes [provider]  pregabalin  (LYRICA) 75 MG capsule Take 75 mg by mouth every 8 (eight) hours. 01/24/22  Yes [provider]  Turmeric 450 MG CAPS Take 450 mg by mouth at bedtime.   Yes [provider]    Physical Exam: Vitals:   03/06/23 0500 03/06/23 0719 03/06/23 1000 03/06/23 1143  BP: 104/61 126/60 113/62 119/68  Pulse: 75 72 72 72  Resp: 17 19  16   Temp:  98.1 F (36.7 C)  98 F (36.7 C)  TempSrc:  Oral  Oral  SpO2: 90% 97% 91% 97%  Weight:      Height:       General exam: Alert, awake, oriented x 3; no fever, reports feeling slightly nauseous but denies chest pain. Respiratory system: Clear to auscultation. Respiratory effort normal.  Good saturation on room air. Cardiovascular system:RRR. No rubs or gallops. Gastrointestinal system: Abdomen is nondistended, soft and with tenderness to deep palpation in her right upper quadrant and lower abdomen.  No guarding.  Positive bowel sounds. Central nervous system: Alert and oriented. No focal neurological deficits. Extremities: No cyanosis, clubbing or edema. Skin: No petechiae. Psychiatry: Judgement and insight appear normal. Mood & affect appropriate.   Data Reviewed: Direct bilirubin 0.7 CBC: WBC 10.3, hemoglobin 11.1 and platelets count 250 K Lipase: 83 Basic metabolic panel: Sodium 139, potassium 3.8, chloride 103, bicarb 26, BUN 28, creatinine 1.49 and GFR 39 Hepatic function panel: Total protein 7.2, albumin 4.4, AST 286, ALT 64, alkaline phosphatase 106, total bilirubin 1.2  Assessment and Plan: * RUQ pain -With some images results concerning for cholelithiasis and early cholecystitis -Patient reporting intermittent nausea and some vomiting at home. -Afebrile -Mild LFTs elevation possible secondary to chronic use of ethambutol. -Continue conservative management, follow HIDA scan results and further recommendations by general surgery. -Adequate IV hydration, antiemetics and analgesics will be provided. -Normal WBCs appreciated;  holding on any other antibiotics at the time. -After HIDA scan procedure completed will allow patient to have liquid diet.  Lupus (HCC) -History of lupus -Continue outpatient follow-up with rheumatology service -Continue chronic daily use of prednisone. -Continue as needed analgesics and supportive care.  DVT (deep venous thrombosis) (HCC) -Patient with history of DVT -Chronically on Eliquis; on hold in preparation of possible surgical intervention -Using heparin for DVT prophylaxis at the moment.  Controlled type 2 diabetes mellitus without complication, without long-term current use of insulin (HCC) -Holding oral hypoglycemic agents while inpatient -Update A1c -Sliding scale insulin and Semglee will be provided. -Follow CBG fluctuation.  Class 1 obesity -Body mass index is 30.16 kg/m. -Low-calorie diet and portion control discussed with patient.  Mycobacterial infection -Continue outpatient follow-up with infectious disease service -Continue treatment with Zithromax, Zyvox and ethambutol.  Gastroesophageal reflux disease -Continue treatment  with PPI.  Hyperlipidemia -Planning to resume the use of Setia and Tricor at discharge -Heart healthy diet discussed with patient  Chronic kidney disease stage IIIb -Appears to be stable and at baseline -Continue minimizing nephrotoxic agents -Maintain adequate hydration -Follow renal function trend.   Advance Care Planning:   Code Status: Full Code   Consults: General surgery  Family Communication: Sister at bedside.  Severity of Illness: The appropriate patient status for this patient is OBSERVATION. Observation status is judged to be reasonable and necessary in order to provide the required intensity of service to ensure the patient's safety. The patient's presenting symptoms, physical exam findings, and initial radiographic and laboratory data in the context of their medical condition is felt to place them at decreased risk  for further clinical deterioration. Furthermore, it is anticipated that the patient will be medically stable for discharge from the hospital within 2 midnights of admission.   Author: Vassie Loll, MD 03/06/2023 4:25 PM  For on call review [email or web address].

## 2023-03-06 NOTE — Assessment & Plan Note (Addendum)
-  A1c 5.3 -Continue holding oral hypoglycemic agent -Continue to follow CBGs fluctuation and provide adequate coverage by sliding scale insulin. -Mild hypoglycemia present earlier while NPO.

## 2023-03-06 NOTE — Assessment & Plan Note (Signed)
-  Body mass index is 30.16 kg/m. -Low-calorie diet and portion control discussed with patient.

## 2023-03-06 NOTE — ED Notes (Signed)
Patient transported to CT 

## 2023-03-06 NOTE — ED Notes (Signed)
[facility] called to clarify no PO or Pain Meds

## 2023-03-06 NOTE — Assessment & Plan Note (Addendum)
-  Patient with history of DVT -Chronically on Eliquis; on hold for anticipated surgical procedure -Planning to resume Eliquis once cleared by general surgery.

## 2023-03-06 NOTE — Consult Note (Addendum)
[facility] Consult  Reason for Consult: Gallstones, question acute cholecystitis Referring Physician: Dr. Estell Harpin  Chief Complaint   Flank Pain     HPI: Joy Leach is a 67 y.o. female with who presents with the several day history of lower abdominal pain and a 1 day history of nausea, vomiting, and epigastric abdominal pain.  She states that her lower abdominal pain feels very similar to the last time she had a kidney stone, which she needed to have surgically removed.  She intermittently has epigastric abdominal pain with some associated nausea and vomiting.  She states that this does happen after eating.  She currently denies any upper abdominal pain.  She believes that she has been having this issue over the last several months.  She denies any known history of gallbladder disease or liver disease.  Her past medical history significant for chronic kidney disease, diabetes, history of blood clots on Eliquis, hypertension, hyperlipidemia, lupus on 5 mg of prednisone daily, and mycobacterial disease.  She denies any history of abdominal surgeries.  She last took her Eliquis yesterday afternoon.  In the ED, she underwent a CT abdomen and pelvis which demonstrated bilateral nephrolithiasis without evidence of obstructive uropathy, and cholelithiasis with gallbladder wall thickening.  She subsequently underwent an abdominal ultrasound which demonstrated steatosis of the liver, cholelithiasis and sludge, and wall thickening.  She has no leukocytosis.  Her AST and ALT are mildly elevated.  Her total bilirubin is normal, but direct bilirubin is 0.7.  Past Medical History:  Diagnosis Date   Chronic kidney disease    Diabetes mellitus without complication (HCC)    H/O blood clots    Headache    History of hypertension    Hyperlipidemia    Lupus (HCC)    Mycobacterial disease    Sicca (HCC)     Past Surgical History:  Procedure Laterality Date   ABDOMINAL HYSTERECTOMY      BIOPSY  10/26/2021   Procedure: BIOPSY;  Surgeon: Dolores Frame, MD;  [facility];  Service: Gastroenterology;;   BIOPSY  04/02/2022   Procedure: BIOPSY;  Surgeon: Dolores Frame, MD;  [facility];  Service: Gastroenterology;;   COLONOSCOPY  05/26/2015   Dr Teena Dunk, mild diverticulosis in sigmoid colon, biosies taken from [facility] and sigmoid colon-negative   COLONOSCOPY WITH PROPOFOL N/A 04/02/2022   Procedure: COLONOSCOPY WITH PROPOFOL;  Surgeon: Dolores Frame, MD;  [facility];  Service: Gastroenterology;  Laterality: N/A;  815   CYSTOSCOPY WITH RETROGRADE PYELOGRAM, URETEROSCOPY AND STENT PLACEMENT Right 01/30/2023   Procedure: CYSTOSCOPY WITH RETROGRADE PYELOGRAM, URETEROSCOPY AND STENT PLACEMENT;  Surgeon: Malen Gauze, MD;  Location: [facility];  Service: Urology;  Laterality: Right;   ESOPHAGOGASTRODUODENOSCOPY (EGD) WITH PROPOFOL N/A 10/26/2021   Procedure: ESOPHAGOGASTRODUODENOSCOPY (EGD) WITH PROPOFOL;  Surgeon: Dolores Frame, MD;  [facility];  Service: Gastroenterology;  Laterality: N/A;  1045   EXTRACORPOREAL SHOCK WAVE LITHOTRIPSY Right 12/03/2022   Procedure: EXTRACORPOREAL SHOCK WAVE LITHOTRIPSY (ESWL);  Surgeon: Malen Gauze, MD;  Location: [facility];  Service: Urology;  Laterality: Right;   HEMOSTASIS CLIP PLACEMENT  10/26/2021   Procedure: HEMOSTASIS CLIP PLACEMENT;  Surgeon: Dolores Frame, MD;  [facility];  Service: Gastroenterology;;   HOLMIUM LASER APPLICATION Right 01/30/2023   Procedure: HOLMIUM LASER APPLICATION;  Surgeon: Malen Gauze, MD;  Location: [facility];  Service: Urology;  Laterality: Right;   HOT HEMOSTASIS  10/26/2021   Procedure: HOT  HEMOSTASIS (ARGON PLASMA COAGULATION/BICAP);  Surgeon: Marguerita Merles, Reuel Boom, MD;  [facility];  Service: Gastroenterology;;   hystorectomy     POLYPECTOMY  10/26/2021   Procedure: POLYPECTOMY;   Surgeon: Dolores Frame, MD;  [facility];  Service: Gastroenterology;;   POLYPECTOMY  04/02/2022   Procedure: POLYPECTOMY;  Surgeon: Marguerita Merles, Reuel Boom, MD;  [facility];  Service: Gastroenterology;;   SINUS SURGERY WITH INSTATRAK  2015    Family History  Problem Relation Age of Onset   Hypertension Mother    Diabetes Mother    Kidney disease Mother    Heart disease Mother    High Cholesterol Mother    Lung cancer Mother    Hypertension Father    Diabetes Father    Kidney disease Father    Heart disease Father     Social History   Tobacco Use   Smoking status: Never    Passive exposure: Never   Smokeless tobacco: Never  Vaping Use   Vaping Use: Never used  Substance Use Topics   Alcohol use: No   Drug use: No    Medications: I have reviewed the patient's current medications. Prior to Admission:  Medications Prior to Admission  Medication Sig Dispense Refill Last Dose   albuterol (VENTOLIN HFA) 108 (90 Base) MCG/ACT inhaler Inhale 1 puff into the lungs every 6 (six) hours as needed for shortness of breath.      alfuzosin (UROXATRAL) 10 MG 24 hr tablet Take 1 tablet (10 mg total) by mouth daily with breakfast. 30 tablet 0    apixaban (ELIQUIS) 5 MG TABS tablet Take 5 mg by mouth 2 (two) times daily.      azelastine (ASTELIN) 0.1 % nasal spray Place 1 spray into both nostrils daily.      azithromycin (ZITHROMAX) 250 MG tablet Take 250 mg by mouth daily.      ethambutol (MYAMBUTOL) 400 MG tablet Take 1,200 mg by mouth every Monday, Wednesday, and Friday.      ezetimibe (ZETIA) 10 MG tablet Take 10 mg by mouth daily.      fenofibrate (TRICOR) 145 MG tablet Take 145 mg by mouth every evening.      ferrous sulfate 325 (65 FE) MG EC tablet Take 325 mg by mouth daily with breakfast.      furosemide (LASIX) 20 MG tablet TAKE (1) TABLET BY MOUTH ONCE DAILY AS NEEDED. (Patient taking differently: Take 5 mg by mouth daily as needed (leg  swelling). Half of 10 mg) 30 tablet 0    HYDROcodone-acetaminophen (NORCO/VICODIN) 5-325 MG tablet Take 1-2 tablets by mouth every 6 (six) hours as needed for moderate pain or severe pain. 30 tablet 0    hydrocortisone (ANUSOL-HC) 2.5 % rectal cream Place 1 Application rectally 4 (four) times daily. Use four times per day x10 days then PRN thereafter (Patient taking differently: Place 1 Application rectally daily as needed for hemorrhoids or anal itching.) 56 g 1    imiquimod (ALDARA) 5 % cream Apply 1 Application topically 3 (three) times a week.      JANUMET 50-1000 MG tablet Take 1 tablet by mouth 2 (two) times daily.      linezolid (ZYVOX) 600 MG tablet Take 600 mg by mouth daily.      loperamide (IMODIUM) 2 MG capsule Take 2 mg by mouth as needed for diarrhea or loose stools.      meclizine (ANTIVERT) 25 MG tablet Take 25 mg by mouth 3 (three)  times daily as needed for dizziness or nausea.      Multiple Vitamin (MULTIVITAMIN) tablet Take 1 tablet by mouth in the morning.      ondansetron (ZOFRAN-ODT) 4 MG disintegrating tablet Take 1 tablet (4 mg total) by mouth every 8 (eight) hours as needed for nausea or vomiting. 30 tablet 0    pantoprazole (PROTONIX) 40 MG tablet TAKE ONE TABLET BY MOUTH EVERY DAY 90 tablet 2    pioglitazone (ACTOS) 15 MG tablet Take 15 mg by mouth daily.      predniSONE 5 MG TBEC Take 5 mg by mouth daily with breakfast.      pregabalin (LYRICA) 75 MG capsule Take 75 mg by mouth every 8 (eight) hours.      Turmeric 450 MG CAPS Take 450 mg by mouth at bedtime.      Scheduled:  [START ON 03/07/2023] alfuzosin  10 mg Oral Q breakfast   azithromycin  250 mg Oral Daily   [START ON 03/07/2023] ethambutol  1,200 mg Oral Q M,W,F   heparin injection (subcutaneous)  5,000 Units Subcutaneous Q8H   insulin aspart  0-9 Units Subcutaneous TID WC   insulin glargine-yfgn  5 Units Subcutaneous QHS   pantoprazole  40 mg Oral BID   [START ON 03/07/2023] predniSONE  5 mg Oral Q breakfast    pregabalin  75 mg Oral Q8H   Continuous:  Allergies  Allergen Reactions   Sulfa Antibiotics Hives   Caffeine Other (See Comments)    Bladder infections   Codeine Nausea And Vomiting     ROS:  Pertinent items are noted in HPI.  Blood pressure 113/62, pulse 72, temperature 98.1 F (36.7 C), temperature source Oral, resp. rate 19, height 5\' 5"  (1.651 m), weight 82.2 kg, SpO2 91 %. Physical Exam Vitals reviewed.  Constitutional:      Appearance: Normal appearance.  HENT:     Head: Normocephalic and atraumatic.  Eyes:     Extraocular Movements: Extraocular movements intact.     Pupils: Pupils are equal, round, and reactive to light.  Cardiovascular:     Rate and Rhythm: Normal rate.  Pulmonary:     Effort: Pulmonary effort is normal.  Abdominal:     Comments: Abdomen soft, nondistended, no percussion tenderness, mild tenderness to palpation in left lower quadrant; no rigidity, guarding, rebound tenderness; negative Murphy sign  Musculoskeletal:        General: Normal range of motion.     Cervical back: Normal range of motion.  Skin:    General: Skin is warm and dry.  Neurological:     General: No focal deficit present.     Mental Status: She is alert and oriented to person, place, and time.  Psychiatric:        Mood and Affect: Mood normal.        Behavior: Behavior normal.     Results: Results for orders placed or performed during the hospital encounter of 03/05/23 (from the past 48 hour(s))  Urinalysis, Routine w reflex microscopic -Urine, Clean Catch     Status: Abnormal   Collection Time: 03/05/23  8:25 PM  Result Value Ref Range   Color, Urine YELLOW YELLOW   APPearance CLEAR CLEAR   Specific Gravity, Urine 1.023 1.005 - 1.030   pH 7.0 5.0 - 8.0   Glucose, UA NEGATIVE NEGATIVE mg/dL   Hgb urine dipstick NEGATIVE NEGATIVE   Bilirubin Urine NEGATIVE NEGATIVE   Ketones, ur NEGATIVE NEGATIVE mg/dL   Protein, ur 30 (  A) NEGATIVE mg/dL   Nitrite NEGATIVE  NEGATIVE   Leukocytes,Ua TRACE (A) NEGATIVE   RBC / HPF 0-5 0 - 5 RBC/hpf   WBC, UA 11-20 0 - 5 WBC/hpf   Bacteria, UA RARE (A) NONE SEEN   Squamous Epithelial / HPF 0-5 0 - 5 /HPF   WBC Clumps PRESENT    Mucus PRESENT     Comment: Performed at [facility], [address] [place], [place] [address]  Basic metabolic panel     Status: Abnormal   Collection Time: 03/05/23 11:55 PM  Result Value Ref Range   Sodium 139 135 - 145 mmol/L   Potassium 3.8 3.5 - 5.1 mmol/L   Chloride 103 98 - 111 mmol/L   CO2 26 22 - 32 mmol/L   Glucose, Bld 110 (H) 70 - 99 mg/dL    Comment: Glucose reference range applies only to samples taken after fasting for at least 8 hours.   BUN 28 (H) 8 - 23 mg/dL   Creatinine, Ser 6.04 (H) 0.44 - 1.00 mg/dL   Calcium 54.0 (H) 8.9 - 10.3 mg/dL   GFR, Estimated 39 (L) >60 mL/min    Comment: (NOTE) Calculated using the CKD-EPI Creatinine Equation (2021)    Anion gap 10 5 - 15    Comment: Performed at [facility], [address] [place], [place] [address]  CBC with Differential     Status: Abnormal   Collection Time: 03/05/23 11:55 PM  Result Value Ref Range   WBC 10.3 4.0 - 10.5 K/uL   RBC 3.55 (L) 3.87 - 5.11 MIL/uL   Hemoglobin 11.1 (L) 12.0 - 15.0 g/dL   HCT 14.7 (L) 82.9 - 56.2 %   MCV 99.4 80.0 - 100.0 fL   MCH 31.3 26.0 - 34.0 pg   MCHC 31.4 30.0 - 36.0 g/dL   RDW 13.0 (H) 86.5 - 78.4 %   Platelets 250 150 - 400 K/uL   nRBC 0.0 0.0 - 0.2 %   Neutrophils Relative % 79 %   Neutro Abs 8.2 (H) 1.7 - 7.7 K/uL   Lymphocytes Relative 10 %   Lymphs Abs 1.0 0.7 - 4.0 K/uL   Monocytes Relative 9 %   Monocytes Absolute 0.9 0.1 - 1.0 K/uL   Eosinophils Relative 1 %   Eosinophils Absolute 0.1 0.0 - 0.5 K/uL   Basophils Relative 0 %   Basophils Absolute 0.0 0.0 - 0.1 K/uL   Immature Granulocytes 1 %   Abs Immature Granulocytes 0.05 0.00 - 0.07 K/uL    Comment: Performed at [facility], [address], [place], [place] [address]  Hepatic function panel      Status: Abnormal   Collection Time: 03/06/23 12:05 AM  Result Value Ref Range   Total Protein 7.2 6.5 - 8.1 g/dL   Albumin 4.4 3.5 - 5.0 g/dL   AST 528 (H) 15 - 41 U/L   ALT 64 (H) 0 - 44 U/L   Alkaline Phosphatase 106 38 - 126 U/L   Total Bilirubin 1.2 0.3 - 1.2 mg/dL   Bilirubin, Direct 0.7 (H) 0.0 - 0.2 mg/dL   Indirect Bilirubin 0.5 0.3 - 0.9 mg/dL    Comment: Performed at [facility], [address], [place], [place] [address]  Lipase, blood     Status: Abnormal   Collection Time: 03/06/23 12:05 AM  Result Value Ref Range   Lipase 83 (H) 11 - 51 U/L    Comment: Performed at [facility], [address],  [place], [place] [address]    US Abdomen Limited RUQ (LIVER/GB)  Result Date: 03/06/2023 CLINICAL DATA:  Epigastric abdominal pain for 1 week EXAM: ULTRASOUND ABDOMEN LIMITED RIGHT UPPER QUADRANT COMPARISON:  Renal stone protocol CT 03/06/2023 FINDINGS: Gallbladder: No pericholecystic fluid.  Sonographic Murphy's sign is negative. Diffusely abnormal appearance of the gallbladder which may be due to lumen being filled with stone and sludge. The gallbladder wall appears diffusely thickened. Common bile duct: Diameter: 6 mm Liver: Parenchymal echogenicity: Diffusely increased Contours: Normal Lesions: None Portal vein: Patent.  Hepatopetal flow Other: None. IMPRESSION: 1. Diffuse increased echogenicity of the hepatic parenchyma is a nonspecific indicator of hepatocellular dysfunction, most commonly steatosis. 2. Abnormal appearance of the gallbladder which is likely a combination of stone and sludge filling the gallbladder lumen with gallbladder wall thickening. Sloughed gallbladder mucosa with gangrenous cholecystitis is difficult to completely exclude. If there is high clinical suspicion for acute cholecystitis, further evaluation with MRI or HIDA scan should be performed. Electronically Signed   By: Acquanetta Belling M.D.   On: 03/06/2023 09:37   CT Renal Stone Study  Result Date:  03/06/2023 CLINICAL DATA:  Abdominal/flank pain, stone suspected. Persistent left flank pain for 1 week. Recent kidney stone surgery on right side 1 month ago. EXAM: CT ABDOMEN AND PELVIS WITHOUT CONTRAST TECHNIQUE: Multidetector CT imaging of the abdomen and pelvis was performed following the standard protocol without IV contrast. RADIATION DOSE REDUCTION: This exam was performed according to the departmental dose-optimization program which includes automated exposure control, adjustment of the mA and/or kV according to patient size and/or use of iterative reconstruction technique. COMPARISON:  11/27/2022. FINDINGS: Lower chest: Heart is enlarged and coronary artery calcifications are noted. Strandy atelectasis is present at the lung bases. Hepatobiliary: No focal liver abnormality is seen. No intrahepatic biliary ductal dilatation. The common bile duct measures 8 mm and there is a 5 mm calculus in the region of the ampulla. Stones are present within the gallbladder and there is gallbladder wall thickening measuring up to 4 mm. There is a questionable stone in the cystic duct. Pancreas: Unremarkable. No pancreatic ductal dilatation or surrounding inflammatory changes. Spleen: Normal in size without focal abnormality. Adrenals/Urinary Tract: No adrenal nodule or mass. Multiple renal calculi are noted bilaterally measuring up to 3 mm on the left. No ureteral calculus or obstructive uropathy bilaterally. The bladder is unremarkable. Stomach/Bowel: A linear radiopaque density is noted in the stomach measuring 1.8 cm. Appendix appears normal. No evidence of bowel wall thickening, distention, or inflammatory changes. No free air or pneumatosis. Vascular/Lymphatic: Aortic atherosclerosis. Prominent lymph nodes are present at the porta hepatis, which may be reactive. Reproductive: Status post hysterectomy. No adnexal masses. Other: No abdominopelvic ascites. A fat containing umbilical hernia is noted. Musculoskeletal:  Degenerative changes are present in the thoracolumbar spine. No acute osseous abnormality. IMPRESSION: 1. Bilateral nephrolithiasis without evidence of obstructive uropathy. 2. Cholelithiasis with gallbladder wall thickening. There is a questionable stone in the cystic duct. There is distention of the common bile duct with a 5 mm calcification in the region of the ampulla. Ultrasound is recommended for further evaluation. 3. Linear radiopaque density in the stomach. Correlate clinically to exclude foreign body. 4. Aortic atherosclerosis and coronary artery calcifications. Electronically Signed   By: Thornell Sartorius M.D.   On: 03/06/2023 00:33     Assessment & Plan:  Chirsty Bartmess is a 67 y.o. female who presents with abdominal pain.  Imaging is demonstrating cholelithiasis and sludge with some wall thickening, but  inconclusive for acute cholecystitis.  Mild elevation in AST, ALT, and direct bilirubin.  Imaging and blood work evaluated by myself.  -I explained the pathophysiology of gallbladder disease.  I discussed with the patient that some of her symptoms are likely related to gallbladder pathology, though her lower abdominal pain is not caused by her gallbladder.  While some of her imaging could be characteristic with acute cholecystitis, her exam is benign and suggest against acute cholecystitis. -Recommend HIDA scan to fully evaluate for cystic duct obstruction.   -If there is no obstruction of her cystic duct, would trial a diet and she can follow-up outpatient for elective cholecystectomy.   -If cystic duct obstruction is noted on HIDA scan, will plan for the patient to remain admitted to the hospital overnight with plan for surgery tomorrow. -NPO until completion of HIDA scan -Hold Eliquis -IV fluids -PRN pain control and antiemetics (hold narcotic pain medications until completion of HIDA scan) -Patient to be admitted to hospitalist -Further recommendations to follow imaging  All questions  were answered to the satisfaction of the patient and family.  -- Theophilus Kinds, DO [facility] [address] [place], [place] [address] [phone] (office)

## 2023-03-06 NOTE — Assessment & Plan Note (Addendum)
-  Continue outpatient follow-up with infectious disease service -Continue treatment with Zithromax, Zyvox and ethambutol.

## 2023-03-06 NOTE — Assessment & Plan Note (Signed)
-  Continue outpatient follow-up with rheumatology service. -While inpatient continue daily use of low-dose prednisone and as needed analgesic.

## 2023-03-06 NOTE — Assessment & Plan Note (Addendum)
-  With some images results concerning for cholelithiasis and early cholecystitis -No fever, normal WBCs appreciated -Patient LFTs slightly higher today -HIDA scan not demonstrating cholecystitis process but with positive finding that suggested CBD obstruction. -Initially with plan for MRCP; after further discussing with general surgery and following patient's wishes planning for patient to be taken to the OR for cholecystectomy and intraoperative cholangiogram. -Will continue supportive care and follow postoperative care and recommendations by general surgery. -Continue as needed analgesics and antiemetics.

## 2023-03-06 NOTE — ED Provider Notes (Signed)
[place] EMERGENCY DEPARTMENT AT [facility] Provider Note   CSN: [id number] Arrival date & time: 03/05/23  2012     History  Chief Complaint  Patient presents with   Flank Pain    Joy Leach is a 67 y.o. female.  HPI    67 year old female with history of CKD, multiple kidney stones with recent procedure for kidney stone comes in with chief complaint of abdominal pain, back pain, nausea and vomiting.  Patient states that she started having bilateral flank pain about a week ago.  Over the last 2 days her symptoms have progressed.  Her symptoms are worse after p.o. intake.  She also has nausea and vomiting, which is worsened today.  Pain is similar to her kidney stone pain.  She has no UTI-like symptoms.  Home Medications Prior to Admission medications   Medication Sig Start Date End Date Taking? Authorizing Provider  albuterol (VENTOLIN HFA) 108 (90 Base) MCG/ACT inhaler Inhale 1 puff into the lungs every 6 (six) hours as needed for shortness of breath.    [provider]  alfuzosin (UROXATRAL) 10 MG 24 hr tablet Take 1 tablet (10 mg total) by mouth daily with breakfast. 01/03/23   McKenzie, Mardene Celeste, MD  apixaban (ELIQUIS) 5 MG TABS tablet Take 5 mg by mouth 2 (two) times daily. 03/26/21   [provider]  azelastine (ASTELIN) 0.1 % nasal spray Place 1 spray into both nostrils daily. 01/14/23 01/14/24  [provider]  azithromycin (ZITHROMAX) 250 MG tablet Take 250 mg by mouth daily. 02/19/22   [provider]  ethambutol (MYAMBUTOL) 400 MG tablet Take 1,200 mg by mouth every Monday, Wednesday, and Friday.    [provider]  ezetimibe (ZETIA) 10 MG tablet Take 10 mg by mouth daily.    [provider]  fenofibrate (TRICOR) 145 MG tablet Take 145 mg by mouth every evening. 03/13/21   [provider]  ferrous sulfate 325 (65 FE) MG EC tablet Take 325 mg by mouth daily with breakfast.    [provider]   furosemide (LASIX) 20 MG tablet TAKE (1) TABLET BY MOUTH ONCE DAILY AS NEEDED. Patient taking differently: Take 5 mg by mouth daily as needed (leg swelling). Half of 10 mg 06/19/17   Jodelle Gross, NP  HYDROcodone-acetaminophen (NORCO/VICODIN) 5-325 MG tablet Take 1-2 tablets by mouth every 6 (six) hours as needed for moderate pain or severe pain. 01/30/23   McKenzie, Mardene Celeste, MD  hydrocortisone (ANUSOL-HC) 2.5 % rectal cream Place 1 Application rectally 4 (four) times daily. Use four times per day x10 days then PRN thereafter Patient taking differently: Place 1 Application rectally daily as needed for hemorrhoids or anal itching. 06/24/22   Carlan, Chelsea L, NP  imiquimod (ALDARA) 5 % cream Apply 1 Application topically 3 (three) times a week.    [provider]  JANUMET 50-1000 MG tablet Take 1 tablet by mouth 2 (two) times daily. 04/24/21   [provider]  linezolid (ZYVOX) 600 MG tablet Take 600 mg by mouth daily. 02/18/23 05/19/23  [provider]  loperamide (IMODIUM) 2 MG capsule Take 2 mg by mouth as needed for diarrhea or loose stools.    [provider]  meclizine (ANTIVERT) 25 MG tablet Take 25 mg by mouth 3 (three) times daily as needed for dizziness or nausea.    [provider]  Multiple Vitamin (MULTIVITAMIN) tablet Take 1 tablet by mouth in the morning.    [provider]  ondansetron (ZOFRAN-ODT) 4 MG disintegrating tablet Take 1 tablet (4 mg total) by mouth every 8 (eight) hours as needed for nausea or vomiting. 01/30/23   McKenzie, Mardene Celeste, MD  pantoprazole (PROTONIX) 40 MG tablet TAKE ONE TABLET BY MOUTH EVERY DAY 11/01/22   Carlan, Chelsea L, NP  pioglitazone (ACTOS) 15 MG tablet Take 15 mg by mouth daily. 02/25/22   [provider]  predniSONE 5 MG TBEC Take 5 mg by mouth daily with breakfast.    [provider]  pregabalin (LYRICA) 75 MG capsule Take 75 mg by mouth every 8 (eight) hours. 01/24/22    [provider]  Turmeric 450 MG CAPS Take 450 mg by mouth at bedtime.    [provider]      Allergies    Sulfa antibiotics, Caffeine, and Codeine    Review of Systems   Review of Systems  All other systems reviewed and are negative.   Physical Exam Updated Vital Signs BP (!) 124/96 (BP Location: Left Arm)   Pulse 79   Temp 98.5 F (36.9 C) (Oral)   Resp 15   Ht 5\' 5"  (1.651 m)   Wt 82.2 kg   SpO2 97%   BMI 30.16 kg/m  Physical Exam Vitals and nursing note reviewed.  Constitutional:      Appearance: She is well-developed.  HENT:     Head: Normocephalic and atraumatic.  Eyes:     Extraocular Movements: Extraocular movements intact.  Cardiovascular:     Rate and Rhythm: Normal rate.  Pulmonary:     Effort: Pulmonary effort is normal.  Abdominal:     Tenderness: There is abdominal tenderness. There is no guarding or rebound.     Comments: Patient has epigastric tenderness and bilateral lower quadrant tenderness, flank tenderness bilaterally. Negative Murphy's, negative McBurney's  Musculoskeletal:     Cervical back: Normal range of motion and neck supple.  Skin:    General: Skin is dry.  Neurological:     Mental Status: She is alert and oriented to person, place, and time.     ED Results / Procedures / Treatments   Labs (all labs ordered are listed, but only abnormal results are displayed) Labs Reviewed  URINALYSIS, ROUTINE W REFLEX MICROSCOPIC - Abnormal; Notable for the following components:      Result Value   Protein, ur 30 (*)    Leukocytes,Ua TRACE (*)    Bacteria, UA RARE (*)    All other components within normal limits  BASIC METABOLIC PANEL - Abnormal; Notable for the following components:   Glucose, Bld 110 (*)    BUN 28 (*)    Creatinine, Ser 1.49 (*)    Calcium 10.6 (*)    GFR, Estimated 39 (*)    All other components within normal limits  CBC WITH DIFFERENTIAL/PLATELET - Abnormal; Notable for the following components:    RBC 3.55 (*)    Hemoglobin 11.1 (*)    HCT 35.3 (*)    RDW 15.6 (*)    Neutro Abs 8.2 (*)    All other components within normal limits  HEPATIC FUNCTION PANEL  LIPASE, BLOOD    EKG None  Radiology CT Renal Stone Study  Result Date: 03/06/2023 CLINICAL DATA:  Abdominal/flank pain, stone suspected. Persistent left flank pain for 1 week. Recent kidney stone surgery on right side 1 month ago. EXAM: CT ABDOMEN AND PELVIS WITHOUT CONTRAST TECHNIQUE: Multidetector CT imaging of the abdomen and pelvis was performed following the standard  protocol without IV contrast. RADIATION DOSE REDUCTION: This exam was performed according to the departmental dose-optimization program which includes automated exposure control, adjustment of the mA and/or kV according to patient size and/or use of iterative reconstruction technique. COMPARISON:  11/27/2022. FINDINGS: Lower chest: Heart is enlarged and coronary artery calcifications are noted. Strandy atelectasis is present at the lung bases. Hepatobiliary: No focal liver abnormality is seen. No intrahepatic biliary ductal dilatation. The common bile duct measures 8 mm and there is a 5 mm calculus in the region of the ampulla. Stones are present within the gallbladder and there is gallbladder wall thickening measuring up to 4 mm. There is a questionable stone in the cystic duct. Pancreas: Unremarkable. No pancreatic ductal dilatation or surrounding inflammatory changes. Spleen: Normal in size without focal abnormality. Adrenals/Urinary Tract: No adrenal nodule or mass. Multiple renal calculi are noted bilaterally measuring up to 3 mm on the left. No ureteral calculus or obstructive uropathy bilaterally. The bladder is unremarkable. Stomach/Bowel: A linear radiopaque density is noted in the stomach measuring 1.8 cm. Appendix appears normal. No evidence of bowel wall thickening, distention, or inflammatory changes. No free air or pneumatosis. Vascular/Lymphatic: Aortic  atherosclerosis. Prominent lymph nodes are present at the porta hepatis, which may be reactive. Reproductive: Status post hysterectomy. No adnexal masses. Other: No abdominopelvic ascites. A fat containing umbilical hernia is noted. Musculoskeletal: Degenerative changes are present in the thoracolumbar spine. No acute osseous abnormality. IMPRESSION: 1. Bilateral nephrolithiasis without evidence of obstructive uropathy. 2. Cholelithiasis with gallbladder wall thickening. There is a questionable stone in the cystic duct. There is distention of the common bile duct with a 5 mm calcification in the region of the ampulla. Ultrasound is recommended for further evaluation. 3. Linear radiopaque density in the stomach. Correlate clinically to exclude foreign body. 4. Aortic atherosclerosis and coronary artery calcifications. Electronically Signed   By: Thornell Sartorius M.D.   On: 03/06/2023 00:33    Procedures Procedures    Medications Ordered in ED Medications  morphine (PF) 4 MG/ML injection 4 mg (has no administration in time range)  ondansetron (ZOFRAN) injection 4 mg (has no administration in time range)  lactated ringers infusion (has no administration in time range)  sodium chloride 0.9 % bolus 500 mL (0 mLs Intravenous Stopped 03/06/23 0030)  ketorolac (TORADOL) 15 MG/ML injection 10 mg (10 mg Intravenous Given 03/06/23 0008)  ondansetron (ZOFRAN) injection 4 mg (4 mg Intravenous Given 03/06/23 0008)    ED Course/ Medical Decision Making/ A&P                             Medical Decision Making Amount and/or Complexity of Data Reviewed Labs: ordered. Radiology: ordered.  Risk Prescription drug management.  This patient presents to the ED with chief complaint(s) of abdominal pain with pertinent past medical history of CKD, renal stones.The complaint involves an extensive differential diagnosis and also carries with it a high risk of complications and morbidity.    The differential diagnosis  includes : Pancreatitis, Hepatobiliary pathology including cholecystitis, Gastritis/PUD, small bowel obstruction, acute colitis,  Intra abdominal abscess, diverticulitis, appendicitis, hernia, Mesenteric ischemia Nephrolithiasis, Pyelonephritis, UTI/Cystitis, Ovarian cyst.  The initial plan is to get CT scan of the abdomen pelvis without contrast.  Given that patient suspects this is kidney stone, we will give her Toradol 10 mg only along with Zofran.  Additional history obtained: Additional history obtained from family Records reviewed previous admission documents.  I reviewed  patient's recent urology visit and previous CT scans.  Independent labs interpretation:  The following labs were independently interpreted: CBC, BMP are reassuring.  Independent visualization and interpretation of imaging: - I independently visualized the following imaging with scope of interpretation limited to determining acute life threatening conditions related to emergency care: CT abdomen pelvis, which revealed no evidence of hydronephrosis. There is concerns for gallstones.  On repeat evaluation, patient continues to have epigastric tenderness.  She indicates that the pain is worse postprandial.  She has no Murphy's.  Patient feels better, but only slightly.  She is not comfortable going home at this time.  Plan is to get ultrasound right upper quadrant, LFTs and lipase.  Her care will be signed off to incoming team.  If patient remains symptomatic and is noted to have ultrasound evidence of gallstones, then she will need surgery consultation for symptomatic cholelithiasis.    If symptomatically she is better, she can be discharged unless there is cholecystitis.  Final Clinical Impression(s) / ED Diagnoses Final diagnoses:  Generalized abdominal pain    Rx / DC Orders ED Discharge Orders     None         Derwood Kaplan, MD 03/06/23 0107

## 2023-03-07 ENCOUNTER — Inpatient Hospital Stay (HOSPITAL_COMMUNITY): Payer: Medicare HMO

## 2023-03-07 ENCOUNTER — Encounter (HOSPITAL_COMMUNITY)
Admission: EM | Disposition: A | Discharge status: Other Acute Inpatient Hospital | Payer: Self-pay | Source: Home / Self Care | Attending: Internal Medicine

## 2023-03-07 ENCOUNTER — Encounter (HOSPITAL_COMMUNITY): Payer: Self-pay | Admitting: Internal Medicine

## 2023-03-07 ENCOUNTER — Inpatient Hospital Stay (HOSPITAL_COMMUNITY): Payer: Medicare HMO | Admitting: Certified Registered Nurse Anesthetist

## 2023-03-07 ENCOUNTER — Other Ambulatory Visit: Payer: Self-pay

## 2023-03-07 DIAGNOSIS — K8020 Calculus of gallbladder without cholecystitis without obstruction: Secondary | ICD-10-CM

## 2023-03-07 DIAGNOSIS — E119 Type 2 diabetes mellitus without complications: Secondary | ICD-10-CM

## 2023-03-07 DIAGNOSIS — E669 Obesity, unspecified: Secondary | ICD-10-CM | POA: Diagnosis not present

## 2023-03-07 DIAGNOSIS — Z7984 Long term (current) use of oral hypoglycemic drugs: Secondary | ICD-10-CM

## 2023-03-07 DIAGNOSIS — A319 Mycobacterial infection, unspecified: Secondary | ICD-10-CM | POA: Diagnosis not present

## 2023-03-07 DIAGNOSIS — I825Y9 Chronic embolism and thrombosis of unspecified deep veins of unspecified proximal lower extremity: Secondary | ICD-10-CM

## 2023-03-07 DIAGNOSIS — M329 Systemic lupus erythematosus, unspecified: Secondary | ICD-10-CM | POA: Diagnosis not present

## 2023-03-07 DIAGNOSIS — N289 Disorder of kidney and ureter, unspecified: Secondary | ICD-10-CM

## 2023-03-07 DIAGNOSIS — R1011 Right upper quadrant pain: Secondary | ICD-10-CM | POA: Diagnosis not present

## 2023-03-07 HISTORY — PX: CHOLECYSTECTOMY: SHX55

## 2023-03-07 LAB — CBC
HCT: 28.8 % — ABNORMAL LOW (ref 36.0–46.0)
Hemoglobin: 9.2 g/dL — ABNORMAL LOW (ref 12.0–15.0)
MCH: 31.5 pg (ref 26.0–34.0)
MCHC: 31.9 g/dL (ref 30.0–36.0)
MCV: 98.6 fL (ref 80.0–100.0)
Platelets: 187 10*3/uL (ref 150–400)
RBC: 2.92 MIL/uL — ABNORMAL LOW (ref 3.87–5.11)
RDW: 15.5 % (ref 11.5–15.5)
WBC: 5 10*3/uL (ref 4.0–10.5)
nRBC: 0 % (ref 0.0–0.2)

## 2023-03-07 LAB — HEMOGLOBIN A1C
Hgb A1c MFr Bld: 5.3 % (ref 4.8–5.6)
Mean Plasma Glucose: 105 mg/dL

## 2023-03-07 LAB — COMPREHENSIVE METABOLIC PANEL
ALT: 177 U/L — ABNORMAL HIGH (ref 0–44)
AST: 307 U/L — ABNORMAL HIGH (ref 15–41)
Albumin: 3.3 g/dL — ABNORMAL LOW (ref 3.5–5.0)
Alkaline Phosphatase: 115 U/L (ref 38–126)
Anion gap: 7 (ref 5–15)
BUN: 18 mg/dL (ref 8–23)
CO2: 22 mmol/L (ref 22–32)
Calcium: 9.4 mg/dL (ref 8.9–10.3)
Chloride: 107 mmol/L (ref 98–111)
Creatinine, Ser: 1.19 mg/dL — ABNORMAL HIGH (ref 0.44–1.00)
GFR, Estimated: 51 mL/min — ABNORMAL LOW (ref >60)
Glucose, Bld: 81 mg/dL (ref 70–99)
Potassium: 3.8 mmol/L (ref 3.5–5.1)
Sodium: 136 mmol/L (ref 135–145)
Total Bilirubin: 1.6 mg/dL — ABNORMAL HIGH (ref 0.3–1.2)
Total Protein: 5.6 g/dL — ABNORMAL LOW (ref 6.5–8.1)

## 2023-03-07 LAB — GLUCOSE, CAPILLARY
Glucose-Capillary: 90 mg/dL (ref 70–99)
Glucose-Capillary: 76 mg/dL (ref 70–99)
Glucose-Capillary: 72 mg/dL (ref 70–99)
Glucose-Capillary: 97 mg/dL (ref 70–99)
Glucose-Capillary: 167 mg/dL — ABNORMAL HIGH (ref 70–99)
Glucose-Capillary: 178 mg/dL — ABNORMAL HIGH (ref 70–99)

## 2023-03-07 LAB — MAGNESIUM: Magnesium: 1.9 mg/dL (ref 1.7–2.4)

## 2023-03-07 LAB — HIV ANTIBODY (ROUTINE TESTING W REFLEX): HIV Screen 4th Generation wRfx: NONREACTIVE

## 2023-03-07 SURGERY — LAPAROSCOPIC CHOLECYSTECTOMY WITH INTRAOPERATIVE CHOLANGIOGRAM
Anesthesia: General | Site: Abdomen

## 2023-03-07 MED ORDER — ROCURONIUM BROMIDE 10 MG/ML (PF) SYRINGE
PREFILLED_SYRINGE | INTRAVENOUS | Status: AC
  Filled 2023-03-07: qty 10

## 2023-03-07 MED ORDER — DIPHENHYDRAMINE HCL 12.5 MG/5ML PO ELIX: 12.5000 mg | ORAL_SOLUTION | Freq: Three times a day (TID) | ORAL | Status: DC | PRN

## 2023-03-07 MED ORDER — ROCURONIUM BROMIDE 100 MG/10ML IV SOLN
INTRAVENOUS | Status: DC | PRN
  Administered 2023-03-07: 70 mg via INTRAVENOUS
  Administered 2023-03-07: 10 mg via INTRAVENOUS

## 2023-03-07 MED ORDER — DIATRIZOATE MEGLUMINE 30 % UR SOLN
URETHRAL | Status: AC
  Filled 2023-03-07: qty 100

## 2023-03-07 MED ORDER — SODIUM CHLORIDE 0.9 % IV SOLN
2.0000 g | INTRAVENOUS | Status: AC
  Administered 2023-03-07: 2 g via INTRAVENOUS
  Filled 2023-03-07: qty 2

## 2023-03-07 MED ORDER — BUPIVACAINE HCL (PF) 0.5 % IJ SOLN
INTRAMUSCULAR | Status: DC | PRN
  Administered 2023-03-07: 30 mL

## 2023-03-07 MED ORDER — LIDOCAINE HCL (CARDIAC) PF 100 MG/5ML IV SOSY
PREFILLED_SYRINGE | INTRAVENOUS | Status: DC | PRN
  Administered 2023-03-07: 60 mg via INTRAVENOUS

## 2023-03-07 MED ORDER — FENTANYL CITRATE (PF) 100 MCG/2ML IJ SOLN
INTRAMUSCULAR | Status: DC | PRN
  Administered 2023-03-07: 50 ug via INTRAVENOUS
  Administered 2023-03-07: 100 ug via INTRAVENOUS
  Administered 2023-03-07 (×2): 50 ug via INTRAVENOUS

## 2023-03-07 MED ORDER — SUCCINYLCHOLINE CHLORIDE 200 MG/10ML IV SOSY
PREFILLED_SYRINGE | INTRAVENOUS | Status: AC
  Filled 2023-03-07: qty 10

## 2023-03-07 MED ORDER — HYDROMORPHONE HCL 1 MG/ML IJ SOLN: 0.2500 mg | INTRAMUSCULAR | Status: DC | PRN

## 2023-03-07 MED ORDER — CHLORHEXIDINE GLUCONATE CLOTH 2 % EX PADS
6.0000 | MEDICATED_PAD | Freq: Once | CUTANEOUS | Status: AC
  Administered 2023-03-07: 6 via TOPICAL

## 2023-03-07 MED ORDER — PROPOFOL 500 MG/50ML IV EMUL
INTRAVENOUS | Status: DC | PRN
  Administered 2023-03-07: 50 ug/kg/min via INTRAVENOUS

## 2023-03-07 MED ORDER — GADOBUTROL 1 MMOL/ML IV SOLN
8.0000 mL | Freq: Once | INTRAVENOUS | Status: AC | PRN
  Administered 2023-03-07: 8 mL via INTRAVENOUS

## 2023-03-07 MED ORDER — HEMOSTATIC AGENTS (NO CHARGE) OPTIME
TOPICAL | Status: DC | PRN
  Administered 2023-03-07: 1

## 2023-03-07 MED ORDER — 0.9 % SODIUM CHLORIDE (POUR BTL) OPTIME
TOPICAL | Status: DC | PRN
  Administered 2023-03-07: 1000 mL

## 2023-03-07 MED ORDER — LACTATED RINGERS IV SOLN: INTRAVENOUS | Status: DC | PRN

## 2023-03-07 MED ORDER — RINGERS IV SOLN: INTRAVENOUS | Status: AC

## 2023-03-07 MED ORDER — ONDANSETRON HCL 4 MG/2ML IJ SOLN
INTRAMUSCULAR | Status: DC | PRN
  Administered 2023-03-07: 4 mg via INTRAVENOUS

## 2023-03-07 MED ORDER — LACTATED RINGERS IV SOLN: INTRAVENOUS | Status: DC

## 2023-03-07 MED ORDER — DEXTROSE 50 % IV SOLN
INTRAVENOUS | Status: AC
  Administered 2023-03-07: 25 mL via INTRAVENOUS
  Filled 2023-03-07: qty 50

## 2023-03-07 MED ORDER — CHLORHEXIDINE GLUCONATE CLOTH 2 % EX PADS: 6.0000 | MEDICATED_PAD | Freq: Once | CUTANEOUS | Status: DC

## 2023-03-07 MED ORDER — ORAL CARE MOUTH RINSE: 15.0000 mL | Freq: Once | OROMUCOSAL | Status: AC

## 2023-03-07 MED ORDER — ACETAMINOPHEN 500 MG PO TABS
1000.0000 mg | ORAL_TABLET | Freq: Four times a day (QID) | ORAL | Status: DC
  Administered 2023-03-07 – 2023-03-09 (×10): 1000 mg via ORAL
  Filled 2023-03-07 (×11): qty 2

## 2023-03-07 MED ORDER — BUPIVACAINE HCL (PF) 0.5 % IJ SOLN
INTRAMUSCULAR | Status: AC
  Filled 2023-03-07: qty 30

## 2023-03-07 MED ORDER — PROPOFOL 10 MG/ML IV BOLUS
INTRAVENOUS | Status: DC | PRN
  Administered 2023-03-07: 120 mg via INTRAVENOUS

## 2023-03-07 MED ORDER — FENTANYL CITRATE (PF) 250 MCG/5ML IJ SOLN
INTRAMUSCULAR | Status: AC
  Filled 2023-03-07: qty 5

## 2023-03-07 MED ORDER — PROPOFOL 500 MG/50ML IV EMUL
INTRAVENOUS | Status: AC
  Filled 2023-03-07: qty 100

## 2023-03-07 MED ORDER — SUGAMMADEX SODIUM 200 MG/2ML IV SOLN
INTRAVENOUS | Status: DC | PRN
  Administered 2023-03-07: 164.4 mg via INTRAVENOUS

## 2023-03-07 MED ORDER — MORPHINE SULFATE (PF) 2 MG/ML IV SOLN: 2.0000 mg | INTRAVENOUS | Status: DC | PRN

## 2023-03-07 MED ORDER — METOCLOPRAMIDE HCL 5 MG/ML IJ SOLN
INTRAMUSCULAR | Status: AC
  Filled 2023-03-07: qty 2

## 2023-03-07 MED ORDER — OXYCODONE HCL 5 MG PO TABS: 5.0000 mg | ORAL_TABLET | Freq: Four times a day (QID) | ORAL | Status: DC | PRN

## 2023-03-07 MED ORDER — DEXTROSE 50 % IV SOLN: 25.0000 mL | Freq: Once | INTRAVENOUS | Status: AC

## 2023-03-07 MED ORDER — DEXAMETHASONE SODIUM PHOSPHATE 10 MG/ML IJ SOLN
INTRAMUSCULAR | Status: DC | PRN
  Administered 2023-03-07: 10 mg via INTRAVENOUS

## 2023-03-07 MED ORDER — SUCCINYLCHOLINE CHLORIDE 200 MG/10ML IV SOSY
PREFILLED_SYRINGE | INTRAVENOUS | Status: DC | PRN
  Administered 2023-03-07: 120 mg via INTRAVENOUS

## 2023-03-07 MED ORDER — LIDOCAINE HCL (PF) 2 % IJ SOLN
INTRAMUSCULAR | Status: AC
  Filled 2023-03-07: qty 15

## 2023-03-07 MED ORDER — CHLORHEXIDINE GLUCONATE 0.12 % MT SOLN
15.0000 mL | Freq: Once | OROMUCOSAL | Status: AC
  Administered 2023-03-07: 15 mL via OROMUCOSAL

## 2023-03-07 MED ORDER — SODIUM CHLORIDE 0.9 % IV SOLN
INTRAVENOUS | Status: AC
  Filled 2023-03-07: qty 2

## 2023-03-07 MED ORDER — METOCLOPRAMIDE HCL 5 MG/ML IJ SOLN
10.0000 mg | Freq: Once | INTRAMUSCULAR | Status: AC
  Administered 2023-03-07: 10 mg via INTRAVENOUS

## 2023-03-07 SURGICAL SUPPLY — 43 items
APPLIER CLIP ROT 10 11.4 M/L (STAPLE) ×1
BLADE SURG 15 STRL LF DISP TIS (BLADE) IMPLANT
BLADE SURG 15 STRL SS (BLADE) ×1
CHLORAPREP W/TINT 26 (MISCELLANEOUS) IMPLANT
CLIP APPLIE ROT 10 11.4 M/L (STAPLE) IMPLANT
CLOTH BEACON ORANGE TIMEOUT ST (SAFETY) IMPLANT
COVER LIGHT HANDLE STERIS (MISCELLANEOUS) IMPLANT
CUTTER FLEX LINEAR 45M (STAPLE) IMPLANT
DERMABOND ADVANCED .7 DNX12 (GAUZE/BANDAGES/DRESSINGS) IMPLANT
DRAPE C-ARM FOLDED MOBILE STRL (DRAPES) IMPLANT
ELECT REM PT RETURN 9FT ADLT (ELECTROSURGICAL) ×1
ELECTRODE REM PT RTRN 9FT ADLT (ELECTROSURGICAL) IMPLANT
GLOVE BIOGEL PI IND STRL 6.5 (GLOVE) IMPLANT
GLOVE BIOGEL PI IND STRL 7.0 (GLOVE) IMPLANT
GLOVE SURG SS PI 6.5 STRL IVOR (GLOVE) IMPLANT
GLOVE SURG SS PI 7.0 STRL IVOR (GLOVE) IMPLANT
GOWN STRL REUS W/TWL LRG LVL3 (GOWN DISPOSABLE) IMPLANT
HEMOSTAT SNOW SURGICEL 2X4 (HEMOSTASIS) IMPLANT
KIT TURNOVER KIT A (KITS) IMPLANT
MANIFOLD NEPTUNE II (INSTRUMENTS) IMPLANT
NDL INSUFFLATION 14GA 120MM (NEEDLE) IMPLANT
NEEDLE INSUFFLATION 14GA 120MM (NEEDLE) ×1 IMPLANT
NS IRRIG 1000ML POUR BTL (IV SOLUTION) IMPLANT
PACK LAP CHOLE LZT030E (CUSTOM PROCEDURE TRAY) IMPLANT
PAD ARMBOARD 7.5X6 YLW CONV (MISCELLANEOUS) IMPLANT
PENCIL HANDSWITCHING (ELECTRODE) IMPLANT
RELOAD STAPLE 45 3.5 BLU ETS (ENDOMECHANICALS) IMPLANT
RELOAD STAPLE TA45 3.5 REG BLU (ENDOMECHANICALS) ×1 IMPLANT
SET BASIN LINEN APH (SET/KITS/TRAYS/PACK) IMPLANT
SET TUBE SMOKE EVAC HIGH FLOW (TUBING) IMPLANT
SLEEVE Z-THREAD 5X100MM (TROCAR) IMPLANT
SUT MNCRL AB 4-0 PS2 18 (SUTURE) IMPLANT
SUT VICRYL 0 ENDOLOOP (SUTURE) IMPLANT
SUT VICRYL 0 UR6 27IN ABS (SUTURE) IMPLANT
SYR 20ML LL LF (SYRINGE) IMPLANT
SYR 30ML LL (SYRINGE) IMPLANT
SYS BAG RETRIEVAL 10MM (BASKET) ×1
SYSTEM BAG RETRIEVAL 10MM (BASKET) IMPLANT
TROCAR Z-THRD FIOS HNDL 11X100 (TROCAR) IMPLANT
TROCAR Z-THREAD FIOS 5X100MM (TROCAR) IMPLANT
TROCAR Z-THREAD SLEEVE 11X100 (TROCAR) IMPLANT
TUBE CONNECTING 12X1/4 (SUCTIONS) IMPLANT
WARMER LAPAROSCOPE (MISCELLANEOUS) IMPLANT

## 2023-03-07 NOTE — Progress Notes (Addendum)
Progress Note   Patient: Joy Leach JXB:[id number] DOB: 1956-03-12 DOA: 03/05/2023     1 DOS: the patient was seen and examined on 03/07/2023   Brief hospital admission narrative course: Joy Leach is a 67 y.o. 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  Symptom has been present for the last 24 to 48 hours and worsening; patient reported 1 episode of vomiting at home.   No fever, no chest pain, no shortness of breath, no sick contacts, denies dysuria, hematuria, focal weaknesses and any other complaints.     Patient reports good compliance with her medication.  Assessment and Plan: * RUQ pain -With some images results concerning for cholelithiasis and early cholecystitis -No fever, normal WBCs appreciated -Patient LFTs slightly higher today -HIDA scan not demonstrating cholecystitis process but with positive finding that suggested CBD obstruction. -Initially with plan for MRCP; after further discussing with general surgery and following patient's wishes planning for patient to be taken to the OR for cholecystectomy and intraoperative cholangiogram. -Will continue supportive care and follow postoperative care and recommendations by general surgery. -Continue as needed analgesics and antiemetics.  Lupus (HCC) -History of lupus -Continue outpatient follow-up with rheumatology service -Continue chronic daily use of prednisone. -Continue as needed analgesics and supportive care.  DVT (deep venous thrombosis) (HCC) -Patient with history of DVT -Chronically on Eliquis; on hold  for anticipated surgical procedure -Planning to resume Eliquis once cleared by general surgery.  Controlled type 2 diabetes mellitus without complication, without long-term current use of insulin (HCC) -A1c 5.3 -Continue holding oral hypoglycemic agent -Continue to follow CBGs fluctuation and provide adequate coverage by sliding scale insulin. -Mild hypoglycemia present earlier while NPO.  Class 1 obesity -Body mass index is 30.16 kg/m. -Low-calorie diet and portion control discussed with patient.  Mycobacterial infection -Continue outpatient follow-up with infectious disease service -Continue treatment with Zithromax, Zyvox and ethambutol.  Gastroesophageal reflux disease -Continue treatment with PPI.  Hyperlipidemia -Planning to resume the use of zetia and Tricor at discharge -Heart healthy diet discussed with patient  Subjective:  No chest pain, no vomiting, still reporting mild nausea.  Patient is afebrile.  Good saturation on room air.  Physical Exam: Vitals:   03/07/23 1145 03/07/23 1200 03/07/23 1211 03/07/23 1612  BP: (!) 156/87 (!) 152/91 (!) 157/89 134/71  Pulse: 74 74 72 84  Resp: 20 (!) 24 20 20   Temp:  97.8 F (36.6 C) 97.7 F (36.5 C) (!) 97.4 F (36.3 C)  TempSrc:   Axillary Oral  SpO2: 91% 92% 95% 92%  Weight:      Height:       General exam: Alert, awake, oriented x 3; still experiencing intermittent right upper quadrant and flank pain. Respiratory system: Clear to auscultation. Respiratory effort normal.  Good saturation on room air. Cardiovascular system:RRR. No rubs or gallops. Gastrointestinal system: Abdomen is nondistended, soft and demonstrating positive bowel sounds.  Midepigastric/right upper quadrant discomfort on deep palpation reported. Central nervous system: Alert and oriented. No focal neurological deficits. Extremities: No cyanosis or clubbing. Skin: No petechiae. Psychiatry: Judgement and insight appear normal. Mood & affect  appropriate.   Data Reviewed: Magnesium 1.9 CBC: WBCs 5.0, hemoglobin  9.2 and platelet count 187 K CMET: Sodium 136, potassium 3.8, chloride 107, bicarb 22, BUN 18, creatinine 1.19, AST 307, ALT 177, GFR 51 and total bilirubin 1.6  Family Communication: Sister at bedside.  Disposition: Status is: Inpatient Remains inpatient appropriate because: Continue IV fluids, supportive care and follow surgical service recommendation as the plan is for laparoscopic cholecystectomy.   Planned Discharge Destination: Home   Time spent: 35 minutes  Author: Vassie Loll, MD 03/07/2023 6:05 PM  For on call review [email or web address].

## 2023-03-07 NOTE — Progress Notes (Signed)
Patient NPO since midnight. 

## 2023-03-07 NOTE — Transfer of Care (Signed)
Immediate Anesthesia Transfer of Care Note  Patient: Joy Leach  Procedure(s) Performed: LAPAROSCOPIC CHOLECYSTECTOMY (Abdomen)  Patient Location: PACU  Anesthesia Type:General  Level of Consciousness: awake and alert   Airway & Oxygen Therapy: Patient Spontanous Breathing and Patient connected to nasal cannula oxygen  Post-op Assessment: Report given to RN and Post -op Vital signs reviewed and stable  Post vital signs: Reviewed and stable  Last Vitals:  Vitals Value Taken Time  BP 153/76 03/07/23 1131  Temp 98   Pulse 76 03/07/23 1138  Resp 22 03/07/23 1138  SpO2 91 % 03/07/23 1138  Vitals shown include unvalidated device data.  Last Pain:  Vitals:   03/07/23 0913  TempSrc: Oral  PainSc: 0-No pain         Complications: No notable events documented.

## 2023-03-07 NOTE — Anesthesia Postprocedure Evaluation (Signed)
Anesthesia Post Note  Patient: Joy Leach  Procedure(s) Performed: LAPAROSCOPIC CHOLECYSTECTOMY (Abdomen)  Patient location during evaluation: Phase II Anesthesia Type: General Level of consciousness: awake and alert and oriented Pain management: pain level controlled Vital Signs Assessment: post-procedure vital signs reviewed and stable Respiratory status: spontaneous breathing, nonlabored ventilation and respiratory function stable Cardiovascular status: blood pressure returned to baseline and stable Postop Assessment: no apparent nausea or vomiting Anesthetic complications: no  No notable events documented.   Last Vitals:  Vitals:   03/07/23 1200 03/07/23 1211  BP: (!) 152/91 (!) 157/89  Pulse: 74 72  Resp: (!) 24 20  Temp: 36.6 C 36.5 C  SpO2: 92% 95%    Last Pain:  Vitals:   03/07/23 1211  TempSrc: Axillary  PainSc:                  [name]

## 2023-03-07 NOTE — Anesthesia Preprocedure Evaluation (Signed)
Anesthesia Evaluation  Patient identified by MRN, date of birth, ID band Patient awake    Reviewed: Allergy & Precautions, H&P , NPO status , Patient's Chart, lab work & pertinent test results  Airway Mallampati: II  TM Distance: >3 FB Neck ROM: Full    Dental  (+) Dental Advisory Given, Chipped,    Pulmonary neg pulmonary ROS, with exertion   Pulmonary exam normal breath sounds clear to auscultation       Cardiovascular Exercise Tolerance: Good + DVT  Normal cardiovascular exam Rhythm:Regular Rate:Normal  27-Nov-2022 08:30:26 [facility] ROUTINE RECORD 01-Nov-1955 (66 yr) Female Caucasian Vent. rate 69 BPM PR interval 147 ms QRS duration 90 ms QT/QTcB 377/404 ms P-R-T axes 66 21 36 Sinus rhythm Low voltage, precordial leads Confirmed by Benjiman Core [id number]) on 11/27/2022 9:45:36 AM  Left ventricle: The cavity size was normal. Wall thickness was    normal. Doppler parameters are consistent with abnormal left    ventricular relaxation (grade 1 diastolic dysfunction).  - Mitral valve: There was mild regurgitation.     Neuro/Psych  Headaches  negative psych ROS   GI/Hepatic ,GERD  Medicated and Poorly Controlled,,  Endo/Other  negative endocrine ROSdiabetes, Well Controlled, Type 2, Oral Hypoglycemic Agents    Renal/GU Renal InsufficiencyRenal disease  negative genitourinary   Musculoskeletal negative musculoskeletal ROS (+)    Abdominal   Peds negative pediatric ROS (+)  Hematology negative hematology ROS (+)   Anesthesia Other Findings Lupus   Reproductive/Obstetrics negative OB ROS                              Anesthesia Physical Anesthesia Plan  ASA: 3  Anesthesia Plan: General   Post-op Pain Management: Dilaudid IV   Induction: Rapid sequence, Intravenous and Cricoid pressure planned  PONV Risk Score and Plan: 4 or greater and Ondansetron,  Dexamethasone and Metaclopromide  Airway Management Planned: Oral ETT  Additional Equipment:   Intra-op Plan:   Post-operative Plan: Extubation in OR  Informed Consent: I have reviewed the patients History and Physical, chart, labs and discussed the procedure including the risks, benefits and alternatives for the proposed anesthesia with the patient or authorized representative who has indicated his/her understanding and acceptance.     Dental advisory given  Plan Discussed with: CRNA and Surgeon  Anesthesia Plan Comments:          Anesthesia Quick Evaluation

## 2023-03-07 NOTE — Op Note (Signed)
[facility]  Operative Note   Preoperative Diagnosis: Symptomatic cholelithiasis   Postoperative Diagnosis: Same   Procedure(s) Performed: Laparoscopic cholecystectomy   Surgeon: Theophilus Kinds, DO    Assistants: Cecile Sheerer, RNFA   Anesthesia: General endotracheal   Anesthesiologist: Dr. Alva Garnet   Specimens: Gallbladder    Estimated Blood Loss: Minimal    Blood Replacement: None    Complications: None   Indications:  Patient is 67 year old female who was admitted with RUQ abdominal pain. She was noted to have gallstones and wall thickening with concern for possible acute cholecystitis.  She underwent HIDA which demonstrated patent cystic duct with no filling of the small bowel at 2 hours, concerning for common bile duct obstruction.  Decision was made to perform cholecystectomy with attempts at cholangiogram to evaluate for choledocholithiasis.  All risks and benefits of performing this procedure were discussed with the patient including pain, infection, bleeding, damage to the surrounding structures, and need for more procedures or surgery. The patient voiced understanding of the procedure, all questions were sought and answered, and consent was obtained.   Operative Findings:  Mildly inflamed gallbladder, significantly enlarged cystic duct- decision made to not perform cholangiogram secondary to size and concern to not be able to get clip distal to cystic duct opening Critical view of safety obtained Clips and staple line noted to be intact at the completion of case Hemostasis noted at the completion of case   Procedure: The patient was taken to the operating room and placed supine. General endotracheal anesthesia was induced. Intravenous antibiotics were administered per protocol. An orogastric tube positioned to decompress the stomach. The abdomen was prepared and draped in the usual sterile fashion. A time-out was completed verifying correct  patient, procedure, site, positioning, and implant(s) and/or special equipment prior to beginning this procedure.   A supraumbilical incision was made and a Veress technique was utilized to achieve pneumoperitoneum to 15 mmHg with carbon dioxide. A 11 mm optiview port was placed through the supraumbilical region, and a 10 mm 0-degree operative laparoscope was introduced. The area underlying the trocar and Veress needle were inspected and without evidence of injury.  Remaining trocars were placed under direct vision. Two 5 mm ports were placed in the right abdomen, between the anterior axillary and midclavicular line.  A final 11 mm port was placed through the mid-epigastrium, near the falciform ligament.    The gallbladder fundus was elevated cephalad and the infundibulum was retracted to the patient's right. The gallbladder/cystic duct junction was skeletonized. The cystic artery noted in the triangle of Calot and was also skeletonized.  We then continued liberal medial and lateral dissection until the critical view of safety was achieved. The cystic duct was noted to be significantly dilated.  Decision was made to not perform cholangiogram, as I was concerned I would not be able to get a clip across the cystic duct distal to my opening for cholangiogram   The cystic artery was doubly clipped and divided. The cystic duct was divided with laparoscopic linear cutting stapler.  There was a small area of bleeding noted along the staple line, so endoloop was placed just below the staple line.  The gallbladder was then dissected from the liver bed with electrocautery. The specimen was placed in an Endopouch and was retrieved through the epigastric site.   Final inspection revealed acceptable hemostasis. Surgical SNOW was placed in the gallbladder bed.  Trocars were removed and pneumoperitoneum was released.  0 Vicryl fascial sutures were  used to close the epigastric and umbilical port sites. Skin incisions were  closed with 4-0 Monocryl subcuticular sutures and Dermabond. The patient was awakened from anesthesia and extubated without complication.    Theophilus Kinds, DO  [facility] [address] [place], [place] [address] [phone] (office)

## 2023-03-07 NOTE — Progress Notes (Signed)
[facility]  Spoke with the patient's sister in the consultation room.  I explained that she tolerated the procedure without difficulty.  She has dissolvable stitches under the skin with overlying skin glue.  I explained that I did not perform the cholangiogram, as I did not believe I could perform the imaging study safely.  For that reason, we will obtain MRCP today to evaluate for choledocholithiasis.  Further recommendations about disposition will be determined after this imaging study. All questions were answered to her expressed satisfaction.  Plan: -Return to floor -NPO for MRCP -MRCP ordered -PRN pain control and antiemetics -Ok for diet after MRCP -Further disposition to be determined pending MRCP -AM labs -Appreciate hospitalist recommendations  Theophilus Kinds, DO [facility] [address] [place], [place] [address] [phone] (office)

## 2023-03-07 NOTE — Progress Notes (Signed)
Glucose 78. Semglee held, MD Zierle-Ghosh made aware.

## 2023-03-07 NOTE — Progress Notes (Signed)
Patient alert and oriented x4. C/o pain 3/10. PRN tylenol given.

## 2023-03-07 NOTE — Progress Notes (Addendum)
Tolerated clear liquid diet and drank about 50% of meal  Drinking coffee now.  Diet advanced to soft for breakfast.   Sister at bedside.  Lap sites dry and intact

## 2023-03-07 NOTE — Progress Notes (Signed)
[facility] Progress Note  Day of Surgery  Subjective: Patient seen and examined.  She is resting comfortably in bed.  She denies significant abdominal pain, nausea and vomiting at this time.  She has no other complaints.  Objective: Vital signs in last 24 hours: Temp:  [97.9 F (36.6 C)-98.7 F (37.1 C)] 98 F (36.7 C) (05/24 0508) Pulse Rate:  [65-77] 65 (05/24 0508) Resp:  [16-20] 20 (05/24 0508) BP: (103-140)/(62-69) 112/64 (05/24 0508) SpO2:  [91 %-100 %] 93 % (05/24 0508) Last BM Date : 03/05/23 (Per patient)  Intake/Output from previous day: 05/23 0701 - 05/24 0700 In: 1590 [P.O.:600; I.V.:990] Out: -  Intake/Output this shift: No intake/output data recorded.  General appearance: alert, cooperative, and no distress GI: Abdomen soft, nondistended, no percussion tenderness, nontender to palpation; no rigidity, guarding or rebound tenderness; negative murphy's sign  Lab Results:  Recent Labs    03/05/23 2355 03/07/23 0425  WBC 10.3 5.0  HGB 11.1* 9.2*  HCT 35.3* 28.8*  PLT 250 187   BMET Recent Labs    03/05/23 2355 03/07/23 0425  NA 139 136  K 3.8 3.8  CL 103 107  CO2 26 22  GLUCOSE 110* 81  BUN 28* 18  CREATININE 1.49* 1.19*  CALCIUM 10.6* 9.4   PT/INR No results for input(s): "LABPROT", "INR" in the last 72 hours.  Studies/Results: NM Hepato W/EF  Result Date: 03/06/2023 CLINICAL DATA:  Abdominal pain. EXAM: NUCLEAR MEDICINE HEPATOBILIARY IMAGING WITH GALLBLADDER EF TECHNIQUE: Sequential images of the abdomen were obtained out to 60 minutes following intravenous administration of radiopharmaceutical. After slow intravenous infusion of 1.6 micrograms Cholecystokinin, gallbladder ejection fraction was determined. RADIOPHARMACEUTICALS:  5.2 mCi Tc-61m Choletec IV COMPARISON:  Abdominal sonogram 03/06/2023 FINDINGS: Prompt uptake and biliary excretion of activity by the liver is seen. After the first hour of imaging there was no  gallbladder or small bowel activity identified with persistent diffuse increased uptake throughout both lobes of the liver. At the beginning of the second hour gallbladder activity was visualized. However there was persistent diffuse increased uptake of the radiotracer throughout both lobes of liver with paucity of small bowel activity. Calculated gallbladder ejection fraction is 0%. (At 60 min, normal ejection fraction is greater than 40%.) IMPRESSION: 1. There is a patent cystic duct without evidence for acute cholecystitis. 2. No significant response to CCK infusion with gallbladder activity essentially equal to 0. 3. Paucity of small bowel activity identified with persistent diffuse radiotracer uptake throughout both lobes of the liver after 2 hours of imaging. These findings may be seen with hepatic dysfunction versus common bile duct obstruction. On the CT from 03/06/2023 a stone was identified with in the expected location of the distal common bile duct measuring 5 mm. These findings strongly suggest CBD obstruction as etiology of decreased small bowel activity. Electronically Signed   By: Signa Kell M.D.   On: 03/06/2023 16:58   US Abdomen Limited RUQ (LIVER/GB)  Result Date: 03/06/2023 CLINICAL DATA:  Epigastric abdominal pain for 1 week EXAM: ULTRASOUND ABDOMEN LIMITED RIGHT UPPER QUADRANT COMPARISON:  Renal stone protocol CT 03/06/2023 FINDINGS: Gallbladder: No pericholecystic fluid.  Sonographic Murphy's sign is negative. Diffusely abnormal appearance of the gallbladder which may be due to lumen being filled with stone and sludge. The gallbladder wall appears diffusely thickened. Common bile duct: Diameter: 6 mm Liver: Parenchymal echogenicity: Diffusely increased Contours: Normal Lesions: None Portal vein: Patent.  Hepatopetal flow Other: None. IMPRESSION: 1. Diffuse increased echogenicity of the hepatic  parenchyma is a nonspecific indicator of hepatocellular dysfunction, most commonly  steatosis. 2. Abnormal appearance of the gallbladder which is likely a combination of stone and sludge filling the gallbladder lumen with gallbladder wall thickening. Sloughed gallbladder mucosa with gangrenous cholecystitis is difficult to completely exclude. If there is high clinical suspicion for acute cholecystitis, further evaluation with MRI or HIDA scan should be performed. Electronically Signed   By: Acquanetta Belling M.D.   On: 03/06/2023 09:37   CT Renal Stone Study  Result Date: 03/06/2023 CLINICAL DATA:  Abdominal/flank pain, stone suspected. Persistent left flank pain for 1 week. Recent kidney stone surgery on right side 1 month ago. EXAM: CT ABDOMEN AND PELVIS WITHOUT CONTRAST TECHNIQUE: Multidetector CT imaging of the abdomen and pelvis was performed following the standard protocol without IV contrast. RADIATION DOSE REDUCTION: This exam was performed according to the departmental dose-optimization program which includes automated exposure control, adjustment of the mA and/or kV according to patient size and/or use of iterative reconstruction technique. COMPARISON:  11/27/2022. FINDINGS: Lower chest: Heart is enlarged and coronary artery calcifications are noted. Strandy atelectasis is present at the lung bases. Hepatobiliary: No focal liver abnormality is seen. No intrahepatic biliary ductal dilatation. The common bile duct measures 8 mm and there is a 5 mm calculus in the region of the ampulla. Stones are present within the gallbladder and there is gallbladder wall thickening measuring up to 4 mm. There is a questionable stone in the cystic duct. Pancreas: Unremarkable. No pancreatic ductal dilatation or surrounding inflammatory changes. Spleen: Normal in size without focal abnormality. Adrenals/Urinary Tract: No adrenal nodule or mass. Multiple renal calculi are noted bilaterally measuring up to 3 mm on the left. No ureteral calculus or obstructive uropathy bilaterally. The bladder is unremarkable.  Stomach/Bowel: A linear radiopaque density is noted in the stomach measuring 1.8 cm. Appendix appears normal. No evidence of bowel wall thickening, distention, or inflammatory changes. No free air or pneumatosis. Vascular/Lymphatic: Aortic atherosclerosis. Prominent lymph nodes are present at the porta hepatis, which may be reactive. Reproductive: Status post hysterectomy. No adnexal masses. Other: No abdominopelvic ascites. A fat containing umbilical hernia is noted. Musculoskeletal: Degenerative changes are present in the thoracolumbar spine. No acute osseous abnormality. IMPRESSION: 1. Bilateral nephrolithiasis without evidence of obstructive uropathy. 2. Cholelithiasis with gallbladder wall thickening. There is a questionable stone in the cystic duct. There is distention of the common bile duct with a 5 mm calcification in the region of the ampulla. Ultrasound is recommended for further evaluation. 3. Linear radiopaque density in the stomach. Correlate clinically to exclude foreign body. 4. Aortic atherosclerosis and coronary artery calcifications. Electronically Signed   By: Thornell Sartorius M.D.   On: 03/06/2023 00:33    Anti-infectives: Anti-infectives (From admission, onward)    Start     Dose/Rate Route Frequency Ordered Stop   03/07/23 1000  ethambutol (MYAMBUTOL) tablet 1,200 mg        1,200 mg Oral Every M-W-F 03/06/23 1022     03/06/23 1800  linezolid (ZYVOX) tablet 600 mg        600 mg Oral Daily 03/06/23 1624     03/06/23 1030  azithromycin (ZITHROMAX) tablet 250 mg        250 mg Oral Daily 03/06/23 1022     03/06/23 0230  cefTRIAXone (ROCEPHIN) 2 g in sodium chloride 0.9 % 100 mL IVPB        2 g 200 mL/hr over 30 Minutes Intravenous  Once 03/06/23 0223 03/06/23 1610  03/06/23 0230  metroNIDAZOLE (FLAGYL) IVPB 500 mg        500 mg 100 mL/hr over 60 Minutes Intravenous  Once 03/06/23 1610 03/06/23 0419       Assessment/Plan:  Patient is a 68 y.o. female who presents with abdominal  pain.  Imaging is demonstrating cholelithiasis and sludge with some wall thickening, but inconclusive for acute cholecystitis.  Mild elevation in AST, ALT, and direct bilirubin.  Imaging and blood work evaluated by myself.   -HIDA scan demonstrates concern for choledocholithiasis with no filling of the small bowel at 2 hours. Cystic duct is patent -Patient has increasing AST/ALT/Tbili today -Will plan for laparoscopic cholecystectomy with intraoperative cholangiogram today -I counseled the patient about the indication, risks and benefits of laparoscopic cholecystectomy.  She understands there is a very small chance for bleeding, infection, injury to normal structures (including common bile duct), conversion to open surgery, persistent symptoms, evolution of postcholecystectomy diarrhea, need for secondary interventions, anesthesia reaction, cardiopulmonary issues and other risks not specifically detailed here. I described the expected recovery, the plan for follow-up and the restrictions during the recovery phase.  All questions were answered. -I explained to the patient that if the IOC demonstrates choledocholithiasis, she will need to be transferred to [facility] or [facility] for ERCP.  I also explained that we will need to perform MRCP if I am unable to complete IOC -NPO -IV fluids -Continue to hold eliquis -Further recommendations to follow surgery -Appreciate hospitalist recommendations   LOS: 1 day    Aftyn Nott A Manpreet Kemmer 03/07/2023

## 2023-03-07 NOTE — Anesthesia Procedure Notes (Signed)
Procedure Name: Intubation Date/Time: 03/07/2023 9:40 AM  Performed by: Darryl Nestle, CRNAPre-anesthesia Checklist: Patient identified, Emergency Drugs available, Suction available and Patient being monitored Patient Re-evaluated:Patient Re-evaluated prior to induction Oxygen Delivery Method: Circle system utilized Preoxygenation: Pre-oxygenation with 100% oxygen Induction Type: IV induction and Rapid sequence Ventilation: Mask ventilation without difficulty Laryngoscope Size: Mac and 3 Grade View: Grade I Tube type: Oral Tube size: 7.0 mm Number of attempts: 1 Airway Equipment and Method: Stylet and Oral airway Placement Confirmation: ETT inserted through vocal cords under direct vision, positive ETCO2 and breath sounds checked- equal and bilateral Secured at: 22 cm Tube secured with: Tape Dental Injury: Teeth and Oropharynx as per pre-operative assessment

## 2023-03-07 NOTE — Discharge Instructions (Signed)
Ambulatory Surgery Discharge Instructions ? ?Activity ? You are advised to go directly home from the hospital.  Resume light activity. No heavy lifting over 10 lbs or strenuous exercise. ? ?Fluids and Diet ?Regular diet ? ?Medications ? If you have not had a bowel movement in 24 hours, take 2 tablespoons over the counter Milk of mag. ?            You May resume your blood thinners tomorrow (Aspirin, coumadin, or other).  ?You are being discharged with prescriptions for Opioid/Narcotic Medications: There are some specific considerations for these medications that you should know. ?Opioid Meds have risks & benefits. Addiction to these meds is always a concern with prolonged use ?Take medication only as directed ?Do not drive while taking narcotic pain medication ?Do not crush tablets or capsules ?Do not use a different container than medication was dispensed in ?Lock the container of medication in a cool, dry place out of reach of children and pets. ?Opioid medication can cause addiction ?Do not share with anyone else (this is a felony) ?Do not store medications for future use. Dispose of them properly. ?    Disposal:  ?Find a [place] household drug take back site near you.  ?If you can't get to a drug take back site, use the recipe below as a last resort to dispose of expired, unused or unwanted drugs. ?Disposal  ?(Do not dispose chemotherapy drugs this way, talk to your prescribing doctor instead.) Step 1: Mix drugs (do not crush) with dirt, kitty litter, or used coffee grounds and add a small amount of water to dissolve any solid medications. Step 2: Seal drugs in plastic bag. Step 3: Place plastic bag in trash. Step 4: Take prescription container and scratch out personal information, then recycle or throw away. ? ?Operative Site ? You have a liquid bandage over your incisions, this will begin to flake off in about a week. Ok to shower tomorrow. Keep wound clean and dry. No baths or swimming. No lifting more  than 10 pounds. ? ?Contact Information: ?If you have questions or concerns, please call our office, [phone], Monday- Thursday 8AM-5PM and Friday 8AM-12Noon.  ?If it is after hours or on the weekend, please call [facility] Main Number, [phone], and ask to speak to the surgeon on call for Dr. Ardit Danh at [place].  ? ?SPECIFIC COMPLICATIONS TO WATCH FOR: ?Inability to urinate ?Fever over 101? F by mouth ?Nausea and vomiting lasting longer than 24 hours. ?Pain not relieved by medication ordered ?Swelling around the operative site ?Increased redness, warmth, hardness, around operative area ?Numbness, tingling, or cold fingers or toes ?Blood -soaked dressing, (small amounts of oozing may be normal) ?Increasing and progressive drainage from surgical area or exam site ? ?

## 2023-03-07 NOTE — Progress Notes (Signed)
Returned from surgery earlier and lap chole sites dry and intact with glue.  Ambulated to bathroom and voided.  C/O pain and nausea and received IV zofran and dilaudid.  Able to take po scheduled meds with sips of water.  Transported by [facility] for MRCP post surgery.  Sister has been at bedside all day.

## 2023-03-07 NOTE — TOC Initial Note (Signed)
Transition of Care [facility]) - Initial/Assessment Note    Patient Details  Name: Joy Leach MRN: [id number] Date of Birth: 1956-04-14  Transition of [facility]) CM/SW Contact:    Annice Needy, LCSW Phone Number: 03/07/2023, 10:42 AM  Clinical Narrative:                 Transition of Care Department [facility]) has reviewed patient and no TOC needs have been identified at this time. We will continue to monitor patient advancement through interdisciplinary progression rounds. If new patient transition needs arise, please place a TOC consult.       Patient Goals and CMS Choice            Expected Discharge Plan and Services                                              Prior Living Arrangements/Services                       Activities of Daily Living Home Assistive Devices/Equipment: Shower chair with back, Eyeglasses, Environmental consultant (specify type) ADL Screening (condition at time of admission) Patient's cognitive ability adequate to safely complete daily activities?: Yes Is the patient deaf or have difficulty hearing?: No Does the patient have difficulty seeing, even when wearing glasses/contacts?: No Does the patient have difficulty concentrating, remembering, or making decisions?: No Patient able to express need for assistance with ADLs?: Yes Does the patient have difficulty dressing or bathing?: No Independently performs ADLs?: Yes (appropriate for developmental age) Does the patient have difficulty walking or climbing stairs?: Yes Weakness of Legs: Both Weakness of Arms/Hands: Both  Permission Sought/Granted                  Emotional Assessment              Admission diagnosis:  Generalized abdominal pain [R10.84] RUQ pain [R10.11] Pain of upper abdomen [R10.10] Patient Active Problem List   Diagnosis Date Noted   RUQ pain 03/06/2023   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   Class 1 obesity 03/06/2023   Controlled type 2 diabetes mellitus  without complication, without long-term current use of insulin (HCC) 03/06/2023   DVT (deep venous thrombosis) (HCC) 03/06/2023   Lupus (HCC) 03/06/2023   Other hyperparathyroidism (HCC) 02/21/2023   Hypercalcemia 02/21/2023   Rectal bleeding 06/24/2022   Abdominal pain, right upper quadrant 02/21/2022   Nausea without vomiting 02/21/2022   Diarrhea 02/21/2022   Bilious vomiting with nausea 10/21/2021   Gastroesophageal reflux disease 10/21/2021   Melena 10/21/2021   Loss of weight 10/21/2021   Hyperlipidemia 06/12/2016   Anorexia 06/12/2016   Edema 06/12/2016   Otalgia 06/12/2016   Wheezing 06/12/2016   SOB (shortness of breath) 06/12/2016   PCP:  Pollyann Savoy, NP Pharmacy:   [facility] - [place], [place] - [address] [place] [address] Phone: [phone] Fax: [phone]     Social Determinants of Health (SDOH) Social History: SDOH Screenings   Food Insecurity: No Food Insecurity (03/06/2023)  Housing: Patient Declined (03/06/2023)  Transportation Needs: No Transportation Needs (03/06/2023)  Utilities: Not At Risk (03/06/2023)  Tobacco Use: Low Risk  (03/07/2023)   SDOH Interventions:     Readmission Risk Interventions     No data to display

## 2023-03-08 DIAGNOSIS — M329 Systemic lupus erythematosus, unspecified: Secondary | ICD-10-CM | POA: Diagnosis not present

## 2023-03-08 DIAGNOSIS — R1011 Right upper quadrant pain: Secondary | ICD-10-CM | POA: Diagnosis not present

## 2023-03-08 DIAGNOSIS — A319 Mycobacterial infection, unspecified: Secondary | ICD-10-CM | POA: Diagnosis not present

## 2023-03-08 DIAGNOSIS — E669 Obesity, unspecified: Secondary | ICD-10-CM | POA: Diagnosis not present

## 2023-03-08 LAB — CBC
HCT: 30.4 % — ABNORMAL LOW (ref 36.0–46.0)
Hemoglobin: 9.7 g/dL — ABNORMAL LOW (ref 12.0–15.0)
MCH: 31.9 pg (ref 26.0–34.0)
MCHC: 31.9 g/dL (ref 30.0–36.0)
MCV: 100 fL (ref 80.0–100.0)
Platelets: 191 10*3/uL (ref 150–400)
RBC: 3.04 MIL/uL — ABNORMAL LOW (ref 3.87–5.11)
RDW: 15 % (ref 11.5–15.5)
WBC: 6.7 10*3/uL (ref 4.0–10.5)
nRBC: 0 % (ref 0.0–0.2)

## 2023-03-08 LAB — COMPREHENSIVE METABOLIC PANEL
ALT: 219 U/L — ABNORMAL HIGH (ref 0–44)
AST: 452 U/L — ABNORMAL HIGH (ref 15–41)
Albumin: 3.4 g/dL — ABNORMAL LOW (ref 3.5–5.0)
Alkaline Phosphatase: 150 U/L — ABNORMAL HIGH (ref 38–126)
Anion gap: 10 (ref 5–15)
BUN: 13 mg/dL (ref 8–23)
CO2: 22 mmol/L (ref 22–32)
Calcium: 9.7 mg/dL (ref 8.9–10.3)
Chloride: 104 mmol/L (ref 98–111)
Creatinine, Ser: 1.14 mg/dL — ABNORMAL HIGH (ref 0.44–1.00)
GFR, Estimated: 53 mL/min — ABNORMAL LOW (ref >60)
Glucose, Bld: 86 mg/dL (ref 70–99)
Potassium: 3.9 mmol/L (ref 3.5–5.1)
Sodium: 136 mmol/L (ref 135–145)
Total Bilirubin: 3.6 mg/dL — ABNORMAL HIGH (ref 0.3–1.2)
Total Protein: 6 g/dL — ABNORMAL LOW (ref 6.5–8.1)

## 2023-03-08 LAB — GLUCOSE, CAPILLARY
Glucose-Capillary: 87 mg/dL (ref 70–99)
Glucose-Capillary: 106 mg/dL — ABNORMAL HIGH (ref 70–99)
Glucose-Capillary: 159 mg/dL — ABNORMAL HIGH (ref 70–99)
Glucose-Capillary: 125 mg/dL — ABNORMAL HIGH (ref 70–99)

## 2023-03-08 MED ORDER — SODIUM CHLORIDE 0.9 % IV SOLN
2.0000 g | INTRAVENOUS | Status: DC
  Administered 2023-03-08 – 2023-03-09 (×2): 2 g via INTRAVENOUS
  Filled 2023-03-08 (×2): qty 20

## 2023-03-08 NOTE — Progress Notes (Signed)
Progress Note   Patient: Joy Leach ZOX:[id number] DOB: 02-13-1956 DOA: 03/05/2023     2 DOS: the patient was seen and examined on 03/08/2023   Brief hospital admission narrative course: Gracynn Wonderly is a 67 y.o. 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  Symptom has been present for the last 24 to 48 hours and worsening; patient reported 1 episode of vomiting at home.   No fever, no chest pain, no shortness of breath, no sick contacts, denies dysuria, hematuria, focal weaknesses and any other complaints.     Patient reports good compliance with her medication.  Assessment and Plan:  RUQ pain -With some images results concerning for cholelithiasis and early cholecystitis -No fever, normal WBCs appreciated -Patient LFTs slightly higher today -HIDA scan not demonstrating cholecystitis process but with positive finding that suggested CBD obstruction. -Status post laparoscopic cholecystectomy, overall well-tolerated and expressing improvement in her symptoms. -LFTs has continued trending up and MRCP suggested the presence of calculi in her CBD. -At this moment patient will require an ERCP. -Case has been discussed with gastroenterology for [facility] campuses but no availability for procedure to be done. -They have recommended starting Rocephin prophylactically and tried to get patient to another facility. -Waiting answer from [facility] and [facility] at the moment. -Continue supportive care and follow any other recommendations postoperatively by general surgery. -Continue as needed  analgesics and antiemetics.   Lupus (HCC) -History of lupus -Continue outpatient follow-up with rheumatology service -Continue chronic daily use of prednisone. -Continue as needed analgesics and supportive care.   DVT (deep venous thrombosis) (HCC) -Patient with history of DVT -Chronically on Eliquis; on hold for anticipated surgical procedure -Planning to resume Eliquis once cleared by general surgery.   Controlled type 2 diabetes mellitus without complication, without long-term current use of insulin (HCC) -A1c 5.3 -Continue holding oral hypoglycemic agent -Mild hypoglycemia isolated event while n.p.o. appreciated. -Continue to follow CBGs fluctuation. -Patient will no need hypoglycemic agents at discharge based on A1c and current CBGs fluctuation.   Class 1 obesity -Body mass index is 30.16 kg/m. -Low-calorie diet and portion control discussed with patient.   Mycobacterial infection -Continue outpatient follow-up with infectious disease service -Continue treatment with Zithromax, Zyvox and ethambutol. -Rocephin added as per GI recommendations given the presence of transaminitis and calculi in her CBD.   Gastroesophageal reflux disease -Continue treatment with PPI. -Lifestyle changes discussed with patient.   Hyperlipidemia -Planning to resume the use of zetia and Tricor at discharge -Heart healthy diet discussed with patient  Subjective:  No chest pain, no nausea, no vomiting.  Tolerated diet without problems.  LFTs trending up an MRCP suggesting the presence of calculi in her CBD.  Physical Exam: Vitals:   03/07/23 2024 03/08/23 0025 03/08/23 0418 03/08/23 1414  BP: 131/68 134/72 128/65 133/67  Pulse: 74 68 68 98  Resp: 20 20 18 17   Temp: 97.6 F (36.4 C) 98.5 F (36.9 C) 98.3 F (36.8 C) 98.3 F (36.8 C)  TempSrc: Oral   Oral  SpO2: 95% 96% 94% 92%  Weight:      Height:       General exam: Alert, awake, oriented x 3; reporting no  nausea or vomiting at the  moment patient has been able to tolerate soft diet without difficulties.  Expressing very little abdominal discomfort around incisional laparoscopic entrance points. Respiratory system: Clear to auscultation. Respiratory effort normal.  Good saturation on room air. Cardiovascular system:RRR. No rubs or gallops; no JVD. Gastrointestinal system: Abdomen is nondistended, soft and with positive bowel sounds.  Mild discomfort around the entrance laparoscopic wounds.  Also some mild discomfort  with deep palpation in her right/mid abdomen. Central nervous system: Alert and oriented. No focal neurological deficits. Extremities: No cyanosis or clubbing. Skin: No petechiae. Psychiatry: Judgement and insight appear normal. Mood & affect appropriate.   Data Reviewed: Magnesium 1.9 CBC: WBCs 6.7, hemoglobin 9.7 and platelet count 191 K. CMET: Sodium 136, potassium 3.9, chloride 104, bicarb 22, BUN 13, creatinine 1.14, AST 452, ALT 219, alkaline phosphatase 150, total bilirubin 3.6 and GFR 53.  Family Communication: Sister at bedside.  Disposition: Status is: Inpatient Remains inpatient appropriate because: Status post laparoscopic cholecystectomy; reporting feeling better but LFTs trending up in the wrong direction along with bilirubin.  Patient's MRCP demonstrating the presence of a small calculi in her CBD.  Waiting responses from [facility] and [facility] for potential transfer; no availability to pursued ERCP in our system at the moment.   Planned Discharge Destination: Home  Time spent: 35 minutes  Author: Vassie Loll, MD 03/08/2023 3:56 PM  For on call review [email or web address].

## 2023-03-08 NOTE — Progress Notes (Signed)
[facility] Progress Note  1 Day Post-Op  Subjective: Patient seen and examined.  She is resting comfortably in bed.  She was able to tolerate her diet without nausea and vomiting.  She complains of some abdominal soreness at her incision sites but is otherwise doing well.  Objective: Vital signs in last 24 hours: Temp:  [97.4 F (36.3 C)-98.5 F (36.9 C)] 98.3 F (36.8 C) (05/25 1414) Pulse Rate:  [68-98] 98 (05/25 1414) Resp:  [17-20] 17 (05/25 1414) BP: (128-134)/(65-72) 133/67 (05/25 1414) SpO2:  [92 %-96 %] 92 % (05/25 1414) Last BM Date : 03/05/23  Intake/Output from previous day: 05/24 0701 - 05/25 0700 In: 2642.6 [P.O.:240; I.V.:2402.6] Out: 20 [Blood:20] Intake/Output this shift: Total I/O In: 480 [P.O.:480] Out: -   General appearance: alert, cooperative, and no distress GI: Abdomen soft, nondistended, no percussion tenderness, nontender to palpation; no rigidity, guarding, rebound tenderness; incisions C/D/I with Dermabond in place, mild ecchymosis at epigastric incision site  Lab Results:  Recent Labs    03/07/23 0425 03/08/23 0430  WBC 5.0 6.7  HGB 9.2* 9.7*  HCT 28.8* 30.4*  PLT 187 191   BMET Recent Labs    03/07/23 0425 03/08/23 0430  NA 136 136  K 3.8 3.9  CL 107 104  CO2 22 22  GLUCOSE 81 86  BUN 18 13  CREATININE 1.19* 1.14*  CALCIUM 9.4 9.7   PT/INR No results for input(s): "LABPROT", "INR" in the last 72 hours.  Studies/Results: MR ABDOMEN WITH MRCP W CONTRAST  Result Date: 03/07/2023 CLINICAL DATA:  Common bile duct obstruction, status post cholecystectomy, abnormal HIDA EXAM: MRI ABDOMEN WITH CONTRAST (WITH MRCP) TECHNIQUE: Multiplanar multisequence MR imaging of the abdomen was performed following the administration of intravenous contrast. Heavily T2-weighted images of the biliary and pancreatic ducts were obtained, and three-dimensional MRCP images were rendered by post processing. CONTRAST:  8mL GADAVIST  GADOBUTROL 1 MMOL/ML IV SOLN COMPARISON:  CT abdomen pelvis, abdominal ultrasound, nuclear scintigraphic HIDA, 03/06/2019 FINDINGS: Lower chest: Small bilateral pleural effusions and associated atelectasis or consolidation. Hepatobiliary: No focal liver abnormality is seen. Status post interval cholecystectomy with expected postoperative fluid and fat stranding in the gallbladder fossa. No biliary ductal dilatation. Appearance of the ampulla is very suspicious for at least one tiny calculus measuring no larger than 0.3 cm (series 17, image 27, 26). Pancreas: Unremarkable. No pancreatic ductal dilatation or surrounding inflammatory changes. Spleen: Normal in size without significant abnormality. Adrenals/Urinary Tract: Adrenal glands are unremarkable. Kidneys are normal, without renal calculi, solid lesion, or hydronephrosis. Stomach/Bowel: Metallic susceptibility artifact obscures the majority of the stomach related to a clip identified by prior CT. No evidence of bowel wall thickening, distention, or inflammatory changes. Vascular/Lymphatic: No significant vascular findings are present. No enlarged abdominal lymph nodes. Other: No abdominal wall hernia. Epigastric and right upper quadrant subcutaneous fat stranding in keeping with laparoscopy access. No ascites. Musculoskeletal: No acute or significant osseous findings. IMPRESSION: 1. Appearance of the ampulla is very suspicious for at least one tiny calculus measuring no larger than 0.3 cm and in keeping with appearance on prior CT. No biliary ductal dilatation. 2. Status post interval cholecystectomy with expected postoperative fluid and fat stranding in the gallbladder fossa. 3. Small bilateral pleural effusions and associated atelectasis or consolidation. Electronically Signed   By: Jearld Lesch M.D.   On: 03/07/2023 17:15   NM Hepato W/EF  Result Date: 03/06/2023 CLINICAL DATA:  Abdominal pain. EXAM: NUCLEAR MEDICINE HEPATOBILIARY IMAGING WITH GALLBLADDER  EF TECHNIQUE: Sequential images of the abdomen were obtained out to 60 minutes following intravenous administration of radiopharmaceutical. After slow intravenous infusion of 1.6 micrograms Cholecystokinin, gallbladder ejection fraction was determined. RADIOPHARMACEUTICALS:  5.2 mCi Tc-67m Choletec IV COMPARISON:  Abdominal sonogram 03/06/2023 FINDINGS: Prompt uptake and biliary excretion of activity by the liver is seen. After the first hour of imaging there was no gallbladder or small bowel activity identified with persistent diffuse increased uptake throughout both lobes of the liver. At the beginning of the second hour gallbladder activity was visualized. However there was persistent diffuse increased uptake of the radiotracer throughout both lobes of liver with paucity of small bowel activity. Calculated gallbladder ejection fraction is 0%. (At 60 min, normal ejection fraction is greater than 40%.) IMPRESSION: 1. There is a patent cystic duct without evidence for acute cholecystitis. 2. No significant response to CCK infusion with gallbladder activity essentially equal to 0. 3. Paucity of small bowel activity identified with persistent diffuse radiotracer uptake throughout both lobes of the liver after 2 hours of imaging. These findings may be seen with hepatic dysfunction versus common bile duct obstruction. On the CT from 03/06/2023 a stone was identified with in the expected location of the distal common bile duct measuring 5 mm. These findings strongly suggest CBD obstruction as etiology of decreased small bowel activity. Electronically Signed   By: Signa Kell M.D.   On: 03/06/2023 16:58    Anti-infectives: Anti-infectives (From admission, onward)    Start     Dose/Rate Route Frequency Ordered Stop   03/08/23 1630  cefTRIAXone (ROCEPHIN) 2 g in sodium chloride 0.9 % 100 mL IVPB        2 g 200 mL/hr over 30 Minutes Intravenous Every 24 hours 03/08/23 1533     03/07/23 1000  ethambutol  (MYAMBUTOL) tablet 1,200 mg        1,200 mg Oral Every M-W-F 03/06/23 1022     03/07/23 0930  cefoTEtan (CEFOTAN) 2 g in sodium chloride 0.9 % 100 mL IVPB        2 g 200 mL/hr over 30 Minutes Intravenous On call to O.R. 03/07/23 3086 03/07/23 0932   03/07/23 0859  sodium chloride 0.9 % with cefoTEtan (CEFOTAN) ADS Med       Note to Pharmacy: Daphane Shepherd H: cabinet override      03/07/23 0859 03/07/23 1007   03/06/23 1800  linezolid (ZYVOX) tablet 600 mg        600 mg Oral Daily 03/06/23 1624     03/06/23 1030  azithromycin (ZITHROMAX) tablet 250 mg        250 mg Oral Daily 03/06/23 1022     03/06/23 0230  cefTRIAXone (ROCEPHIN) 2 g in sodium chloride 0.9 % 100 mL IVPB        2 g 200 mL/hr over 30 Minutes Intravenous  Once 03/06/23 0223 03/06/23 0312   03/06/23 0230  metroNIDAZOLE (FLAGYL) IVPB 500 mg        500 mg 100 mL/hr over 60 Minutes Intravenous  Once 03/06/23 0223 03/06/23 0419       Assessment/Plan:  Patient is 67 year old female who is admitted with right upper quadrant pain and concern for symptomatic cholelithiasis.  She is status post laparoscopic cholecystectomy on 5/24.  -I was unable to perform intraoperative cholangiogram, so MRCP was performed postoperatively.  MRCP is demonstrating concern for choledocholithiasis with a tiny calculus at the ampulla -LFTs are continuing to increase today with total bilirubin increasing to 3.6 from  1.6 -GI at [facility] consulted, they are unable to perform any procedures over the weekend.  Recommending Rocephin and reaching out to other hospital systems to see if GI would be available for ERCP -Dr. Gwenlyn Perking awaiting to hear back -Okay for regular diet from general surgery standpoint -PRN pain control and antiemetics -Care per primary team   LOS: 2 days    Ronnel Zuercher A Raizel Wesolowski 03/08/2023

## 2023-03-09 DIAGNOSIS — M329 Systemic lupus erythematosus, unspecified: Secondary | ICD-10-CM | POA: Diagnosis not present

## 2023-03-09 DIAGNOSIS — A319 Mycobacterial infection, unspecified: Secondary | ICD-10-CM | POA: Diagnosis not present

## 2023-03-09 DIAGNOSIS — E119 Type 2 diabetes mellitus without complications: Secondary | ICD-10-CM | POA: Diagnosis not present

## 2023-03-09 DIAGNOSIS — K8070 Calculus of gallbladder and bile duct without cholecystitis without obstruction: Secondary | ICD-10-CM

## 2023-03-09 DIAGNOSIS — R1011 Right upper quadrant pain: Secondary | ICD-10-CM | POA: Diagnosis not present

## 2023-03-09 LAB — GLUCOSE, CAPILLARY
Glucose-Capillary: 102 mg/dL — ABNORMAL HIGH (ref 70–99)
Glucose-Capillary: 196 mg/dL — ABNORMAL HIGH (ref 70–99)
Glucose-Capillary: 126 mg/dL — ABNORMAL HIGH (ref 70–99)
Glucose-Capillary: 151 mg/dL — ABNORMAL HIGH (ref 70–99)

## 2023-03-09 LAB — COMPREHENSIVE METABOLIC PANEL
ALT: 233 U/L — ABNORMAL HIGH (ref 0–44)
AST: 350 U/L — ABNORMAL HIGH (ref 15–41)
Albumin: 3 g/dL — ABNORMAL LOW (ref 3.5–5.0)
Alkaline Phosphatase: 166 U/L — ABNORMAL HIGH (ref 38–126)
Anion gap: 12 (ref 5–15)
BUN: 10 mg/dL (ref 8–23)
CO2: 19 mmol/L — ABNORMAL LOW (ref 22–32)
Calcium: 9.2 mg/dL (ref 8.9–10.3)
Chloride: 105 mmol/L (ref 98–111)
Creatinine, Ser: 1.08 mg/dL — ABNORMAL HIGH (ref 0.44–1.00)
GFR, Estimated: 57 mL/min — ABNORMAL LOW (ref >60)
Glucose, Bld: 132 mg/dL — ABNORMAL HIGH (ref 70–99)
Potassium: 3.6 mmol/L (ref 3.5–5.1)
Sodium: 136 mmol/L (ref 135–145)
Total Bilirubin: 3.8 mg/dL — ABNORMAL HIGH (ref 0.3–1.2)
Total Protein: 5.5 g/dL — ABNORMAL LOW (ref 6.5–8.1)

## 2023-03-09 MED ORDER — BISACODYL 10 MG RE SUPP: 10.0000 mg | Freq: Every day | RECTAL | Status: DC

## 2023-03-09 MED ORDER — INSULIN ASPART 100 UNIT/ML IJ SOLN: 0.0000 [IU] | Freq: Three times a day (TID) | INTRAMUSCULAR | Status: DC

## 2023-03-09 MED ORDER — OXYCODONE HCL 5 MG PO TABS: 5.0000 mg | ORAL_TABLET | Freq: Four times a day (QID) | ORAL | Status: DC | PRN

## 2023-03-09 MED ORDER — IMIQUIMOD 5 % EX CREA: 1.0000 | TOPICAL_CREAM | CUTANEOUS | Status: DC

## 2023-03-09 MED ORDER — POLYVINYL ALCOHOL 1.4 % OP SOLN: 1.0000 [drp] | OPHTHALMIC | Status: AC | PRN

## 2023-03-09 MED ORDER — ACETAMINOPHEN 500 MG PO TABS: 1000.0000 mg | ORAL_TABLET | Freq: Three times a day (TID) | ORAL | Status: AC | PRN

## 2023-03-09 MED ORDER — FERROUS SULFATE 325 (65 FE) MG PO TBEC: 325.0000 mg | DELAYED_RELEASE_TABLET | Freq: Every day | ORAL | Status: AC

## 2023-03-09 MED ORDER — APIXABAN 5 MG PO TABS: 5.0000 mg | ORAL_TABLET | Freq: Two times a day (BID) | ORAL | Status: AC

## 2023-03-09 MED ORDER — POLYETHYLENE GLYCOL 3350 17 G PO PACK
17.0000 g | PACK | Freq: Every day | ORAL | Status: DC
  Administered 2023-03-09: 17 g via ORAL
  Filled 2023-03-09: qty 1

## 2023-03-09 MED ORDER — HYDROMORPHONE HCL 1 MG/ML IJ SOLN: 1.0000 mg | INTRAMUSCULAR | Status: DC | PRN

## 2023-03-09 MED ORDER — DIPHENHYDRAMINE HCL 12.5 MG/5ML PO ELIX: 12.5000 mg | ORAL_SOLUTION | Freq: Three times a day (TID) | ORAL | Status: DC | PRN

## 2023-03-09 MED ORDER — SODIUM CHLORIDE 0.9 % IV SOLN: 2.0000 g | INTRAVENOUS | Status: DC

## 2023-03-09 MED ORDER — SENNOSIDES-DOCUSATE SODIUM 8.6-50 MG PO TABS
1.0000 | ORAL_TABLET | Freq: Two times a day (BID) | ORAL | Status: DC
  Administered 2023-03-09 (×2): 1 via ORAL
  Filled 2023-03-09 (×2): qty 1

## 2023-03-09 MED ORDER — HEPARIN SODIUM (PORCINE) 5000 UNIT/ML IJ SOLN: 5000.0000 [IU] | Freq: Three times a day (TID) | INTRAMUSCULAR | 0 refills | Status: DC

## 2023-03-09 NOTE — Discharge Summary (Signed)
Physician Discharge Summary   Patient: Joy Leach MRN: [id number] DOB: 03-08-1956  Admit date:     03/05/2023  Discharge date: 03/09/23  Discharge Physician: Vassie Loll   PCP: Pollyann Savoy, NP   Recommendations at discharge:  Medically stable and in no acute distress. Reporting mild intermittent nausea, no vomiting tolerating diet. -Patient will be transfer to [facility] for ERCP. Outpatient follow-up as instructed with general surgery.  Discharge Diagnoses: Principal Problem: RUQ pain Cholelithiasis with choledocholithiasis Transaminitis  Active Problems: Meets hyperlipidemia Gastroesophageal reflux disease Mycobacterial infection Class 1 obesity Controlled type 2 diabetes mellitus without complication, without long-term current use of insulin (HCC) DVT (deep venous thrombosis) (HCC) Lupus (HCC)  Brief hospital admission narrative course: Joy Leach is a 67 y.o. 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  Symptom has been present for the last 24 to 48 hours and worsening; patient reported 1 episode of vomiting at home.   No fever, no chest pain, no shortness of breath, no sick contacts, denies dysuria, hematuria, focal weaknesses and any other complaints.     Patient reports good compliance with her medication.  Assessment and Plan: RUQ pain -With some images results concerning for cholelithiasis and early cholecystitis -No fever, normal WBCs appreciated -Patient LFTs slightly higher today -HIDA scan not demonstrating cholecystitis  process but with positive finding that suggested CBD obstruction. -Status post laparoscopic cholecystectomy, overall well-tolerated and expressing improvement in her symptoms. -LFTs has continued trending up and MRCP suggested the presence of calculi in her CBD. -At this moment patient will require an ERCP. -Case has been discussed with gastroenterology for [facility] campuses but no availability for procedure to be done. -They have recommended starting Rocephin prophylactically and tried to get patient to another facility. -Patient has been accepted to [facility] for ERCP and further management. -Hemodynamically stable -No fever, tolerating diet and in not acute distress. -Continue to follow any further recommendations postoperatively by general surgery. -Continue as needed analgesics and antiemetics.   Lupus (HCC) -History of lupus -Continue outpatient follow-up with rheumatology service -Continue chronic daily use of prednisone. -Continue as needed analgesics and supportive care.   DVT (deep venous thrombosis) (HCC) -Patient with history of DVT -Chronically on Eliquis; on hold for anticipated surgical procedure -Planning to resume Eliquis at discharge once all anticipated procedures completed (planning for ERCP at receiving facility). -Patient has been cleared to resume anticoagulation by general surgery at this moment.   Controlled type 2 diabetes mellitus without complication, without long-term current use of insulin (HCC) -A1c 5.3 -Continue holding oral hypoglycemic agent at time of discharge; diet management will be recommended with close follow-up.  At this moment given episodes of hypoglycemia reported no need for hypoglycemic agents to control diabetes. -Continue to follow CBGs fluctuation.  Class 1 obesity -Body mass index is 30.16 kg/m. -Low-calorie diet and portion control discussed with patient.   Mycobacterial infection -Continue outpatient follow-up with  infectious disease service -Continue treatment with Zithromax, Zyvox and ethambutol. -Rocephin added as per GI recommendations given the presence of transaminitis and calculi in her CBD.   Gastroesophageal reflux disease -Continue treatment with PPI. -Lifestyle changes discussed with patient.  Hyperlipidemia -Planning to resume the use of zetia and Tricor at discharge -Heart healthy diet discussed with patient  Consultants: General surgery, gastroenterology service. Procedures performed: Laparoscopic cholecystectomy; see below for x-ray reports. Disposition: Transfer to [facility] for ERCP and further management. Diet recommendation: Heart healthy/modified carbohydrate diet.  DISCHARGE MEDICATION: Allergies as of 03/09/2023       Reactions   Sulfa Antibiotics Hives   Caffeine Other (See Comments)   Bladder infections   Codeine Nausea And Vomiting        Medication List     STOP taking these medications    HYDROcodone-acetaminophen 5-325 MG tablet Commonly known as: NORCO/VICODIN   Janumet 50-1000 MG tablet Generic drug: sitaGLIPtin-metformin   loperamide 2 MG capsule Commonly known as: IMODIUM   pioglitazone 15 MG tablet Commonly known as: ACTOS   Turmeric 450 MG Caps       TAKE these medications    acetaminophen 500 MG tablet Commonly known as: TYLENOL Take 2 tablets (1,000 mg total) by mouth every 8 (eight) hours as needed for mild pain, fever or headache.   albuterol 108 (90 Base) MCG/ACT inhaler Commonly known as: VENTOLIN HFA Inhale 1 puff into the lungs every 6 (six) hours as needed for shortness of breath.   alfuzosin 10 MG 24 hr tablet Commonly known as: UROXATRAL Take 1 tablet (10 mg total) by mouth daily with breakfast.   apixaban 5 MG Tabs tablet Commonly known as: ELIQUIS Take 1 tablet (5 mg total) by mouth 2 (two) times daily. To be resume at the moment of discharge and after all procedures completed. What changed: additional  instructions   azelastine 0.1 % nasal spray Commonly known as: ASTELIN Place 1 spray into both nostrils daily.   azithromycin 250 MG tablet Commonly known as: ZITHROMAX Take 250 mg by mouth daily.   bisacodyl 10 MG suppository Commonly known as: DULCOLAX Place 1 suppository (10 mg total) rectally daily. Start taking on: Mar 10, 2023   cefTRIAXone 2 g in sodium chloride 0.9 % 100 mL Inject 2 g into the vein daily.   diphenhydrAMINE 12.5 MG/5ML elixir Commonly known as: BENADRYL Take 5 mLs (12.5 mg total) by mouth every 8 (eight) hours as needed for itching or allergies.   ethambutol 400 MG tablet Commonly known as: MYAMBUTOL Take 1,200 mg by mouth every Monday, Wednesday, and Friday.   ezetimibe 10 MG tablet Commonly known as: ZETIA Take 10 mg by mouth daily.   fenofibrate 145 MG tablet Commonly known as: TRICOR Take 145 mg by mouth every evening.   ferrous sulfate 325 (65 FE) MG EC tablet Take 1 tablet (325 mg total) by mouth daily with breakfast. Resume at time of discharge What changed: additional instructions   furosemide 20 MG tablet Commonly known as: LASIX TAKE (1) TABLET BY MOUTH ONCE DAILY AS NEEDED. What changed: See the new instructions.   heparin 5000 UNIT/ML injection Inject 1 mL (5,000 Units total) into the skin every 8 (eight) hours.   hydrocortisone 2.5 % rectal cream Commonly known as: ANUSOL-HC Place 1 Application rectally 4 (four) times daily. Use four times per day x10 days then PRN thereafter What changed:  when to take this reasons to take this additional instructions   HYDROmorphone 1 MG/ML injection Commonly known as: DILAUDID Inject 1 mL (1 mg total) into the vein every 4 (four) hours as needed for severe pain.   imiquimod 5 % cream Commonly known as: ALDARA Apply 0.25 g topically 3 (three) times  a week. Resume at discharge. Start taking on: Mar 10, 2023 What changed: additional instructions   insulin aspart 100 UNIT/ML  injection Commonly known as: novoLOG Inject 0-9 Units into the skin 3 (three) times daily with meals.   linezolid 600 MG tablet Commonly known as: ZYVOX Take 600 mg by mouth daily.   meclizine 25 MG tablet Commonly known as: ANTIVERT Take 25 mg by mouth 3 (three) times daily as needed for dizziness or nausea.   multivitamin tablet Take 1 tablet by mouth in the morning.   ondansetron 4 MG disintegrating tablet Commonly known as: ZOFRAN-ODT Take 1 tablet (4 mg total) by mouth every 8 (eight) hours as needed for nausea or vomiting.   oxyCODONE 5 MG immediate release tablet Commonly known as: Oxy IR/ROXICODONE Take 1 tablet (5 mg total) by mouth every 6 (six) hours as needed for moderate pain.   pantoprazole 40 MG tablet Commonly known as: PROTONIX TAKE ONE TABLET BY MOUTH EVERY DAY   polyvinyl alcohol 1.4 % ophthalmic solution Commonly known as: LIQUIFILM TEARS Place 1 drop into both eyes as needed for dry eyes.   predniSONE 5 MG Tbec Take 5 mg by mouth daily with breakfast.   pregabalin 75 MG capsule Commonly known as: LYRICA Take 75 mg by mouth every 8 (eight) hours.        Follow-up Information     Pappayliou, Gustavus Messing, DO. Call.   Specialty: General Surgery Why: Call to schedule a follow up appointment in 2 weeks Contact information: [address] [place] [facility] [address] [phone]         Chapell, Waldron Session, NP. Schedule an appointment as soon as possible for a visit in 2 week(s).   Specialty: Family Medicine Why: After discharge from the hospital. Contact information: [address] [place] [address] [phone]                Discharge Exam: Ceasar Mons Weights   03/05/23 2017 03/07/23 0913  Weight: 82.2 kg 82.2 kg   General exam: Alert, awake, oriented x 3; reporting no vomiting at the moment patient has been able to tolerate soft diet without difficulties.  Reporting abdominal soreness; no icterus or jaundice appreciated on  exam.  Patient expressed mild intermittent nausea overnight that at this moment is resolved. Respiratory system: Clear to auscultation. Respiratory effort normal.  Good saturation on room air. Cardiovascular system:RRR. No rubs or gallops; no JVD. Gastrointestinal system: Abdomen is nondistended, soft and with positive bowel sounds.  Mild discomfort around the entrance laparoscopic wounds.  Also some mild discomfort  with deep palpation in her right/mid abdomen. Central nervous system: Alert and oriented. No focal neurological deficits. Extremities: No cyanosis or clubbing. Skin: No petechiae. Psychiatry: Judgement and insight appear normal. Mood & affect appropriate.   Condition at discharge: Stable and improved.  The results of significant diagnostics from this hospitalization (including imaging, microbiology, ancillary and laboratory) are listed below for reference.   Imaging Studies: MR ABDOMEN WITH MRCP W CONTRAST  Result Date: 03/07/2023 CLINICAL DATA:  Common bile duct obstruction, status post cholecystectomy, abnormal HIDA EXAM: MRI ABDOMEN WITH CONTRAST (WITH MRCP) TECHNIQUE: Multiplanar multisequence MR imaging of the abdomen was performed following the administration of intravenous contrast. Heavily T2-weighted images of the biliary and pancreatic ducts were obtained, and three-dimensional MRCP images were rendered by post processing. CONTRAST:  8mL GADAVIST GADOBUTROL 1 MMOL/ML IV SOLN COMPARISON:  CT abdomen pelvis, abdominal ultrasound, nuclear scintigraphic HIDA, 03/06/2019 FINDINGS: Lower chest: Small bilateral pleural  effusions and associated atelectasis or consolidation. Hepatobiliary: No focal liver abnormality is seen. Status post interval cholecystectomy with expected postoperative fluid and fat stranding in the gallbladder fossa. No biliary ductal dilatation. Appearance of the ampulla is very suspicious for at least one tiny calculus measuring no larger than 0.3 cm (series 17,  image 27, 26). Pancreas: Unremarkable. No pancreatic ductal dilatation or surrounding inflammatory changes. Spleen: Normal in size without significant abnormality. Adrenals/Urinary Tract: Adrenal glands are unremarkable. Kidneys are normal, without renal calculi, solid lesion, or hydronephrosis. Stomach/Bowel: Metallic susceptibility artifact obscures the majority of the stomach related to a clip identified by prior CT. No evidence of bowel wall thickening, distention, or inflammatory changes. Vascular/Lymphatic: No significant vascular findings are present. No enlarged abdominal lymph nodes. Other: No abdominal wall hernia. Epigastric and right upper quadrant subcutaneous fat stranding in keeping with laparoscopy access. No ascites. Musculoskeletal: No acute or significant osseous findings. IMPRESSION: 1. Appearance of the ampulla is very suspicious for at least one tiny calculus measuring no larger than 0.3 cm and in keeping with appearance on prior CT. No biliary ductal dilatation. 2. Status post interval cholecystectomy with expected postoperative fluid and fat stranding in the gallbladder fossa. 3. Small bilateral pleural effusions and associated atelectasis or consolidation. Electronically Signed   By: Jearld Lesch M.D.   On: 03/07/2023 17:15   NM Hepato W/EF  Result Date: 03/06/2023 CLINICAL DATA:  Abdominal pain. EXAM: NUCLEAR MEDICINE HEPATOBILIARY IMAGING WITH GALLBLADDER EF TECHNIQUE: Sequential images of the abdomen were obtained out to 60 minutes following intravenous administration of radiopharmaceutical. After slow intravenous infusion of 1.6 micrograms Cholecystokinin, gallbladder ejection fraction was determined. RADIOPHARMACEUTICALS:  5.2 mCi Tc-32m Choletec IV COMPARISON:  Abdominal sonogram 03/06/2023 FINDINGS: Prompt uptake and biliary excretion of activity by the liver is seen. After the first hour of imaging there was no gallbladder or small bowel activity identified with persistent  diffuse increased uptake throughout both lobes of the liver. At the beginning of the second hour gallbladder activity was visualized. However there was persistent diffuse increased uptake of the radiotracer throughout both lobes of liver with paucity of small bowel activity. Calculated gallbladder ejection fraction is 0%. (At 60 min, normal ejection fraction is greater than 40%.) IMPRESSION: 1. There is a patent cystic duct without evidence for acute cholecystitis. 2. No significant response to CCK infusion with gallbladder activity essentially equal to 0. 3. Paucity of small bowel activity identified with persistent diffuse radiotracer uptake throughout both lobes of the liver after 2 hours of imaging. These findings may be seen with hepatic dysfunction versus common bile duct obstruction. On the CT from 03/06/2023 a stone was identified with in the expected location of the distal common bile duct measuring 5 mm. These findings strongly suggest CBD obstruction as etiology of decreased small bowel activity. Electronically Signed   By: Signa Kell M.D.   On: 03/06/2023 16:58   US Abdomen Limited RUQ (LIVER/GB)  Result Date: 03/06/2023 CLINICAL DATA:  Epigastric abdominal pain for 1 week EXAM: ULTRASOUND ABDOMEN LIMITED RIGHT UPPER QUADRANT COMPARISON:  Renal stone protocol CT 03/06/2023 FINDINGS: Gallbladder: No pericholecystic fluid.  Sonographic Murphy's sign is negative. Diffusely abnormal appearance of the gallbladder which may be due to lumen being filled with stone and sludge. The gallbladder wall appears diffusely thickened. Common bile duct: Diameter: 6 mm Liver: Parenchymal echogenicity: Diffusely increased Contours: Normal Lesions: None Portal vein: Patent.  Hepatopetal flow Other: None. IMPRESSION: 1. Diffuse increased echogenicity of the hepatic parenchyma is a nonspecific indicator  of hepatocellular dysfunction, most commonly steatosis. 2. Abnormal appearance of the gallbladder which is likely a  combination of stone and sludge filling the gallbladder lumen with gallbladder wall thickening. Sloughed gallbladder mucosa with gangrenous cholecystitis is difficult to completely exclude. If there is high clinical suspicion for acute cholecystitis, further evaluation with MRI or HIDA scan should be performed. Electronically Signed   By: Acquanetta Belling M.D.   On: 03/06/2023 09:37   CT Renal Stone Study  Result Date: 03/06/2023 CLINICAL DATA:  Abdominal/flank pain, stone suspected. Persistent left flank pain for 1 week. Recent kidney stone surgery on right side 1 month ago. EXAM: CT ABDOMEN AND PELVIS WITHOUT CONTRAST TECHNIQUE: Multidetector CT imaging of the abdomen and pelvis was performed following the standard protocol without IV contrast. RADIATION DOSE REDUCTION: This exam was performed according to the departmental dose-optimization program which includes automated exposure control, adjustment of the mA and/or kV according to patient size and/or use of iterative reconstruction technique. COMPARISON:  11/27/2022. FINDINGS: Lower chest: Heart is enlarged and coronary artery calcifications are noted. Strandy atelectasis is present at the lung bases. Hepatobiliary: No focal liver abnormality is seen. No intrahepatic biliary ductal dilatation. The common bile duct measures 8 mm and there is a 5 mm calculus in the region of the ampulla. Stones are present within the gallbladder and there is gallbladder wall thickening measuring up to 4 mm. There is a questionable stone in the cystic duct. Pancreas: Unremarkable. No pancreatic ductal dilatation or surrounding inflammatory changes. Spleen: Normal in size without focal abnormality. Adrenals/Urinary Tract: No adrenal nodule or mass. Multiple renal calculi are noted bilaterally measuring up to 3 mm on the left. No ureteral calculus or obstructive uropathy bilaterally. The bladder is unremarkable. Stomach/Bowel: A linear radiopaque density is noted in the stomach  measuring 1.8 cm. Appendix appears normal. No evidence of bowel wall thickening, distention, or inflammatory changes. No free air or pneumatosis. Vascular/Lymphatic: Aortic atherosclerosis. Prominent lymph nodes are present at the porta hepatis, which may be reactive. Reproductive: Status post hysterectomy. No adnexal masses. Other: No abdominopelvic ascites. A fat containing umbilical hernia is noted. Musculoskeletal: Degenerative changes are present in the thoracolumbar spine. No acute osseous abnormality. IMPRESSION: 1. Bilateral nephrolithiasis without evidence of obstructive uropathy. 2. Cholelithiasis with gallbladder wall thickening. There is a questionable stone in the cystic duct. There is distention of the common bile duct with a 5 mm calcification in the region of the ampulla. Ultrasound is recommended for further evaluation. 3. Linear radiopaque density in the stomach. Correlate clinically to exclude foreign body. 4. Aortic atherosclerosis and coronary artery calcifications. Electronically Signed   By: Thornell Sartorius M.D.   On: 03/06/2023 00:33    Microbiology: Results for orders placed or performed in visit on 02/19/23  Microscopic Examination     Status: None   Collection Time: 02/19/23  1:59 PM   Urine  Result Value Ref Range Status   WBC, UA 0-5 0 - 5 /hpf Final   RBC, Urine 0-2 0 - 2 /hpf Final   Epithelial Cells (non renal) 0-10 0 - 10 /hpf Final   Bacteria, UA None seen None seen/Few Final    Labs: CBC: Recent Labs  Lab 03/05/23 2355 03/07/23 0425 03/08/23 0430  WBC 10.3 5.0 6.7  NEUTROABS 8.2*  --   --   HGB 11.1* 9.2* 9.7*  HCT 35.3* 28.8* 30.4*  MCV 99.4 98.6 100.0  PLT 250 187 191   Basic Metabolic Panel: Recent Labs  Lab 03/05/23 2355  03/07/23 0425 03/08/23 0430 03/09/23 0400  NA 139 136 136 136  K 3.8 3.8 3.9 3.6  CL 103 107 104 105  CO2 26 22 22  19*  GLUCOSE 110* 81 86 132*  BUN 28* 18 13 10   CREATININE 1.49* 1.19* 1.14* 1.08*  CALCIUM 10.6* 9.4 9.7  9.2  MG  --  1.9  --   --    Liver Function Tests: Recent Labs  Lab 03/06/23 0005 03/07/23 0425 03/08/23 0430 03/09/23 0400  AST 286* 307* 452* 350*  ALT 64* 177* 219* 233*  ALKPHOS 106 115 150* 166*  BILITOT 1.2 1.6* 3.6* 3.8*  PROT 7.2 5.6* 6.0* 5.5*  ALBUMIN 4.4 3.3* 3.4* 3.0*   CBG: Recent Labs  Lab 03/08/23 1627 03/08/23 2049 03/09/23 0710 03/09/23 1134 03/09/23 1532  GLUCAP 159* 125* 102* 196* 126*    Discharge time spent: greater than 30 minutes.  Signed: Vassie Loll, MD [facility] 03/09/2023

## 2023-03-09 NOTE — Progress Notes (Signed)
Placement nurse Jeannett Senior RN) from [facility] called regarding bed availability, there are still no beds available, the soonest they estimate having a bed is Tuesday May 28th.

## 2023-03-09 NOTE — TOC Progression Note (Addendum)
Transition of Care [facility]) - Progression Note    Patient Details  Name: Joy Leach MRN: [id number] Date of Birth: 10/14/1956  Transition of Care [facility]) CM/SW Contact  Catalina Gravel, LCSW Phone Number: 03/09/2023, 2:55 PM  Clinical Narrative:    Pt in need of a medical procedure ERCP. [facility]  Dept not available until Tues. Pt on wait list for [facility].  Addendum: Pt DC Sunday.    Barriers to Discharge: Continued Medical Work up  Expected Discharge Plan and Services                                               Social Determinants of Health (SDOH) Interventions SDOH Screenings   Food Insecurity: No Food Insecurity (03/06/2023)  Housing: Patient Declined (03/06/2023)  Transportation Needs: No Transportation Needs (03/06/2023)  Utilities: Not At Risk (03/06/2023)  Tobacco Use: Low Risk  (03/07/2023)    Readmission Risk Interventions     No data to display

## 2023-03-09 NOTE — Progress Notes (Signed)
[facility] Progress Note  2 Days Post-Op  Subjective: Patient seen and examined.  She is resting comfortably in bed.  She denies significant abdominal pain.  She is tolerating a diet without nausea and vomiting.  She is passing flatus but denies any bowel movement since surgery.  She is ready to go home.  Objective: Vital signs in last 24 hours: Temp:  [98.2 F (36.8 C)-98.8 F (37.1 C)] 98.8 F (37.1 C) (05/26 0430) Pulse Rate:  [81-104] 104 (05/26 0430) Resp:  [17-20] 20 (05/26 0430) BP: (133-153)/(67-88) 150/88 (05/26 0430) SpO2:  [92 %-94 %] 94 % (05/26 0430) Last BM Date : 03/05/23  Intake/Output from previous day: 05/25 0701 - 05/26 0700 In: 819.9 [P.O.:720; IV Piggyback:99.9] Out: -  Intake/Output this shift: Total I/O In: 240 [P.O.:240] Out: -   General appearance: alert, cooperative, and no distress GI: Abdomen soft, nondistended, no percussion tenderness, minimal incisional tenderness to palpation; no rigidity, guarding, rebound tenderness; laparoscopic incision sites with Dermabond in place, mild ecchymosis at epigastric incision, stable  Lab Results:  Recent Labs    03/07/23 0425 03/08/23 0430  WBC 5.0 6.7  HGB 9.2* 9.7*  HCT 28.8* 30.4*  PLT 187 191   BMET Recent Labs    03/08/23 0430 03/09/23 0400  NA 136 136  K 3.9 3.6  CL 104 105  CO2 22 19*  GLUCOSE 86 132*  BUN 13 10  CREATININE 1.14* 1.08*  CALCIUM 9.7 9.2   PT/INR No results for input(s): "LABPROT", "INR" in the last 72 hours.  Studies/Results: MR ABDOMEN WITH MRCP W CONTRAST  Result Date: 03/07/2023 CLINICAL DATA:  Common bile duct obstruction, status post cholecystectomy, abnormal HIDA EXAM: MRI ABDOMEN WITH CONTRAST (WITH MRCP) TECHNIQUE: Multiplanar multisequence MR imaging of the abdomen was performed following the administration of intravenous contrast. Heavily T2-weighted images of the biliary and pancreatic ducts were obtained, and three-dimensional MRCP  images were rendered by post processing. CONTRAST:  8mL GADAVIST GADOBUTROL 1 MMOL/ML IV SOLN COMPARISON:  CT abdomen pelvis, abdominal ultrasound, nuclear scintigraphic HIDA, 03/06/2019 FINDINGS: Lower chest: Small bilateral pleural effusions and associated atelectasis or consolidation. Hepatobiliary: No focal liver abnormality is seen. Status post interval cholecystectomy with expected postoperative fluid and fat stranding in the gallbladder fossa. No biliary ductal dilatation. Appearance of the ampulla is very suspicious for at least one tiny calculus measuring no larger than 0.3 cm (series 17, image 27, 26). Pancreas: Unremarkable. No pancreatic ductal dilatation or surrounding inflammatory changes. Spleen: Normal in size without significant abnormality. Adrenals/Urinary Tract: Adrenal glands are unremarkable. Kidneys are normal, without renal calculi, solid lesion, or hydronephrosis. Stomach/Bowel: Metallic susceptibility artifact obscures the majority of the stomach related to a clip identified by prior CT. No evidence of bowel wall thickening, distention, or inflammatory changes. Vascular/Lymphatic: No significant vascular findings are present. No enlarged abdominal lymph nodes. Other: No abdominal wall hernia. Epigastric and right upper quadrant subcutaneous fat stranding in keeping with laparoscopy access. No ascites. Musculoskeletal: No acute or significant osseous findings. IMPRESSION: 1. Appearance of the ampulla is very suspicious for at least one tiny calculus measuring no larger than 0.3 cm and in keeping with appearance on prior CT. No biliary ductal dilatation. 2. Status post interval cholecystectomy with expected postoperative fluid and fat stranding in the gallbladder fossa. 3. Small bilateral pleural effusions and associated atelectasis or consolidation. Electronically Signed   By: Jearld Lesch M.D.   On: 03/07/2023 17:15    Anti-infectives: Anti-infectives (From admission, onward)  Start      Dose/Rate Route Frequency Ordered Stop   03/08/23 1630  cefTRIAXone (ROCEPHIN) 2 g in sodium chloride 0.9 % 100 mL IVPB        2 g 200 mL/hr over 30 Minutes Intravenous Every 24 hours 03/08/23 1533     03/07/23 1000  ethambutol (MYAMBUTOL) tablet 1,200 mg        1,200 mg Oral Every M-W-F 03/06/23 1022     03/07/23 0930  cefoTEtan (CEFOTAN) 2 g in sodium chloride 0.9 % 100 mL IVPB        2 g 200 mL/hr over 30 Minutes Intravenous On call to O.R. 03/07/23 1610 03/07/23 0932   03/07/23 0859  sodium chloride 0.9 % with cefoTEtan (CEFOTAN) ADS Med       Note to Pharmacy: Daphane Shepherd H: cabinet override      03/07/23 0859 03/07/23 1007   03/06/23 1800  linezolid (ZYVOX) tablet 600 mg        600 mg Oral Daily 03/06/23 1624     03/06/23 1030  azithromycin (ZITHROMAX) tablet 250 mg        250 mg Oral Daily 03/06/23 1022     03/06/23 0230  cefTRIAXone (ROCEPHIN) 2 g in sodium chloride 0.9 % 100 mL IVPB        2 g 200 mL/hr over 30 Minutes Intravenous  Once 03/06/23 0223 03/06/23 0312   03/06/23 0230  metroNIDAZOLE (FLAGYL) IVPB 500 mg        500 mg 100 mL/hr over 60 Minutes Intravenous  Once 03/06/23 0223 03/06/23 0419       Assessment/Plan:  Patient is 67 year old female who is admitted with right upper quadrant pain and concern for symptomatic cholelithiasis.  She is status post laparoscopic cholecystectomy on 5/24.   -I was unable to perform intraoperative cholangiogram, so MRCP was performed postoperatively.  MRCP is demonstrating concern for choledocholithiasis with a tiny calculus at the ampulla -LFTs are continuing to increase with total bilirubin of 3.8 -GI at [facility] consulted, they are unable to perform any procedures over the weekend.  Recommending Rocephin and reaching out to other hospital systems to see if GI would be available for ERCP -No bed availability at [facility] until Tuesday -Explained to the patient that I believe she needs to stay inpatient  currently, as her bilirubin and LFTs are remaining elevated, and I do not want her to develop cholangitis -Okay for regular diet from general surgery standpoint -PRN pain control and antiemetics -Bowel regimen ordered -Care per primary team   LOS: 3 days    Adrik Khim A Makarios Madlock 03/09/2023

## 2023-03-09 NOTE — Progress Notes (Signed)
[facility] here to pick up patient.

## 2023-03-09 NOTE — Progress Notes (Signed)
Nurse spoke with annette with [facility] [phone] called report for patient to go to bed 254 @ [phone], [phone] charge nurse. Transport will arrive within the hour. Medical necessity printed at desk. Pt informed. Family informed.

## 2023-03-11 LAB — SURGICAL PATHOLOGY

## 2023-03-14 ENCOUNTER — Encounter (HOSPITAL_COMMUNITY): Payer: Self-pay | Admitting: Surgery

## 2023-03-26 ENCOUNTER — Ambulatory Visit: Payer: Medicare HMO | Admitting: "Endocrinology

## 2023-03-26 ENCOUNTER — Ambulatory Visit (INDEPENDENT_AMBULATORY_CARE_PROVIDER_SITE_OTHER): Payer: Medicare HMO | Admitting: Surgery

## 2023-03-26 ENCOUNTER — Encounter: Payer: Self-pay | Admitting: Surgery

## 2023-03-26 ENCOUNTER — Encounter: Payer: Self-pay | Admitting: "Endocrinology

## 2023-03-26 VITALS — BP 105/72 | HR 84 | Temp 98.0°F | Resp 12 | Ht 65.0 in | Wt 182.0 lb

## 2023-03-26 VITALS — BP 110/64 | HR 68 | Ht 65.0 in | Wt 180.2 lb

## 2023-03-26 DIAGNOSIS — M8580 Other specified disorders of bone density and structure, unspecified site: Secondary | ICD-10-CM

## 2023-03-26 DIAGNOSIS — Z09 Encounter for follow-up examination after completed treatment for conditions other than malignant neoplasm: Secondary | ICD-10-CM

## 2023-03-26 DIAGNOSIS — E782 Mixed hyperlipidemia: Secondary | ICD-10-CM

## 2023-03-26 DIAGNOSIS — E212 Other hyperparathyroidism: Secondary | ICD-10-CM | POA: Diagnosis not present

## 2023-03-26 MED ORDER — ONDANSETRON HCL 4 MG PO TABS: 4.0000 mg | ORAL_TABLET | Freq: Three times a day (TID) | ORAL | 0 refills | Status: DC | PRN

## 2023-03-26 NOTE — Progress Notes (Signed)
03/26/2023, 1:55 PM  Endocrinology follow-up note  Joy Leach is a 67 y.o.-year-old female, referred by her  Chapell, Waldron Session, NP  , for evaluation for hypercalcemia/hyperparathyroidism.   Past Medical History:  Diagnosis Date   Chronic kidney disease    Diabetes mellitus without complication (HCC)    H/O blood clots    Headache    History of hypertension    Hyperlipidemia    Lupus (HCC)    Mycobacterial disease    Sicca (HCC)     Past Surgical History:  Procedure Laterality Date   ABDOMINAL HYSTERECTOMY     BIOPSY  10/26/2021   Procedure: BIOPSY;  Surgeon: Dolores Frame, MD;  [facility];  Service: Gastroenterology;;   BIOPSY  04/02/2022   Procedure: BIOPSY;  Surgeon: Dolores Frame, MD;  [facility];  Service: Gastroenterology;;   CHOLECYSTECTOMY N/A 03/07/2023   Procedure: LAPAROSCOPIC CHOLECYSTECTOMY;  Surgeon: Lewie Chamber, DO;  Location: [facility];  Service: General;  Laterality: N/A;   COLONOSCOPY  05/26/2015   Dr Teena Dunk, mild diverticulosis in sigmoid colon, biosies taken from [facility] and sigmoid colon-negative   COLONOSCOPY WITH PROPOFOL N/A 04/02/2022   Procedure: COLONOSCOPY WITH PROPOFOL;  Surgeon: Dolores Frame, MD;  [facility];  Service: Gastroenterology;  Laterality: N/A;  815   CYSTOSCOPY WITH RETROGRADE PYELOGRAM, URETEROSCOPY AND STENT PLACEMENT Right 01/30/2023   Procedure: CYSTOSCOPY WITH RETROGRADE PYELOGRAM, URETEROSCOPY AND STENT PLACEMENT;  Surgeon: Malen Gauze, MD;  Location: [facility];  Service: Urology;  Laterality: Right;   ESOPHAGOGASTRODUODENOSCOPY (EGD) WITH PROPOFOL N/A 10/26/2021   Procedure: ESOPHAGOGASTRODUODENOSCOPY (EGD) WITH PROPOFOL;  Surgeon: Dolores Frame, MD;  [facility];  Service: Gastroenterology;  Laterality: N/A;  1045   EXTRACORPOREAL SHOCK WAVE LITHOTRIPSY Right 12/03/2022    Procedure: EXTRACORPOREAL SHOCK WAVE LITHOTRIPSY (ESWL);  Surgeon: Malen Gauze, MD;  Location: [facility];  Service: Urology;  Laterality: Right;   HEMOSTASIS CLIP PLACEMENT  10/26/2021   Procedure: HEMOSTASIS CLIP PLACEMENT;  Surgeon: Dolores Frame, MD;  [facility];  Service: Gastroenterology;;   HOLMIUM LASER APPLICATION Right 01/30/2023   Procedure: HOLMIUM LASER APPLICATION;  Surgeon: Malen Gauze, MD;  Location: [facility];  Service: Urology;  Laterality: Right;   HOT HEMOSTASIS  10/26/2021   Procedure: HOT HEMOSTASIS (ARGON PLASMA COAGULATION/BICAP);  Surgeon: Marguerita Merles, Reuel Boom, MD;  [facility];  Service: Gastroenterology;;   hystorectomy     POLYPECTOMY  10/26/2021   Procedure: POLYPECTOMY;  Surgeon: Dolores Frame, MD;  [facility];  Service: Gastroenterology;;   POLYPECTOMY  04/02/2022   Procedure: POLYPECTOMY;  Surgeon: Dolores Frame, MD;  [facility];  Service: Gastroenterology;;   SINUS SURGERY WITH INSTATRAK  2015    Social History   Tobacco Use   Smoking status: Never    Passive exposure: Never   Smokeless tobacco: Never  Vaping Use   Vaping Use: Never used  Substance Use Topics   Alcohol use: No   Drug use: No    Family History  Problem Relation Age of Onset   Hypertension Mother  Diabetes Mother    Kidney disease Mother    Heart disease Mother    High Cholesterol Mother    Lung cancer Mother    Hypertension Father    Diabetes Father    Kidney disease Father    Heart disease Father     Outpatient Encounter Medications as of 03/26/2023  Medication Sig   acetaminophen (TYLENOL) 500 MG tablet Take 2 tablets (1,000 mg total) by mouth every 8 (eight) hours as needed for mild pain, fever or headache.   albuterol (VENTOLIN HFA) 108 (90 Base) MCG/ACT inhaler Inhale 1 puff into the lungs every 6 (six) hours as needed for shortness of breath.   alfuzosin (UROXATRAL)  10 MG 24 hr tablet Take 1 tablet (10 mg total) by mouth daily with breakfast.   apixaban (ELIQUIS) 5 MG TABS tablet Take 1 tablet (5 mg total) by mouth 2 (two) times daily. To be resume at the moment of discharge and after all procedures completed.   azelastine (ASTELIN) 0.1 % nasal spray Place 1 spray into both nostrils daily.   azithromycin (ZITHROMAX) 250 MG tablet Take 250 mg by mouth daily.   bisacodyl (DULCOLAX) 10 MG suppository Place 1 suppository (10 mg total) rectally daily.   cefTRIAXone 2 g in sodium chloride 0.9 % 100 mL Inject 2 g into the vein daily.   diphenhydrAMINE (BENADRYL) 12.5 MG/5ML elixir Take 5 mLs (12.5 mg total) by mouth every 8 (eight) hours as needed for itching or allergies.   ethambutol (MYAMBUTOL) 400 MG tablet Take 1,200 mg by mouth every Monday, Wednesday, and Friday.   ezetimibe (ZETIA) 10 MG tablet Take 10 mg by mouth daily.   fenofibrate (TRICOR) 145 MG tablet Take 145 mg by mouth every evening.   ferrous sulfate 325 (65 FE) MG EC tablet Take 1 tablet (325 mg total) by mouth daily with breakfast. Resume at time of discharge   furosemide (LASIX) 20 MG tablet TAKE (1) TABLET BY MOUTH ONCE DAILY AS NEEDED. (Patient taking differently: Take 5 mg by mouth daily as needed (leg swelling). Half of 10 mg)   heparin 5000 UNIT/ML injection Inject 1 mL (5,000 Units total) into the skin every 8 (eight) hours.   hydrocortisone (ANUSOL-HC) 2.5 % rectal cream Place 1 Application rectally 4 (four) times daily. Use four times per day x10 days then PRN thereafter (Patient taking differently: Place 1 Application rectally daily as needed for hemorrhoids or anal itching.)   HYDROmorphone (DILAUDID) 1 MG/ML injection Inject 1 mL (1 mg total) into the vein every 4 (four) hours as needed for severe pain.   imiquimod (ALDARA) 5 % cream Apply 0.25 g topically 3 (three) times a week. Resume at discharge.   insulin aspart (NOVOLOG) 100 UNIT/ML injection Inject 0-9 Units into the skin 3  (three) times daily with meals.   linezolid (ZYVOX) 600 MG tablet Take 600 mg by mouth daily.   meclizine (ANTIVERT) 25 MG tablet Take 25 mg by mouth 3 (three) times daily as needed for dizziness or nausea.   Multiple Vitamin (MULTIVITAMIN) tablet Take 1 tablet by mouth in the morning.   ondansetron (ZOFRAN-ODT) 4 MG disintegrating tablet Take 1 tablet (4 mg total) by mouth every 8 (eight) hours as needed for nausea or vomiting.   oxyCODONE (OXY IR/ROXICODONE) 5 MG immediate release tablet Take 1 tablet (5 mg total) by mouth every 6 (six) hours as needed for moderate pain.   pantoprazole (PROTONIX) 40 MG tablet TAKE ONE TABLET BY MOUTH EVERY DAY  polyvinyl alcohol (LIQUIFILM TEARS) 1.4 % ophthalmic solution Place 1 drop into both eyes as needed for dry eyes.   predniSONE 5 MG TBEC Take 5 mg by mouth daily with breakfast.   pregabalin (LYRICA) 75 MG capsule Take 75 mg by mouth every 8 (eight) hours.   No facility-administered encounter medications on file as of 03/26/2023.    Allergies  Allergen Reactions   Sulfa Antibiotics Hives   Caffeine Other (See Comments)    Bladder infections   Codeine Nausea And Vomiting     HPI  Joy Leach was diagnosed with elevated PTH of 88 on December 06, 2022 associated with mild hypercalcemia.  Recent high calcium of 10.9 was reported.  Her complete records are not available to review.  Patient has no previously known history of parathyroid, pituitary, adrenal dysfunctions; no family history of such dysfunctions. She did have a recent bone density at her rheumatologist.  T-score of the hips were -2.0 consistent with osteopenia.  She is accompanied by her sister to clinic.  She has no new complaints.  Her previsit labs show calcium of 9.2.  No prior history of fragility fractures or falls.  She is unsure of her remote past history of nephrolithiasis. -Her labs also show improved renal function. She does not report any height loss. She does not  control bladder, wears pads and reporting urge incontinence.   she is not on HCTZ or other thiazide therapy.  Her vitamin D status is not known.  She has type 2 diabetes on Actos, Janumet.  She also has hyperlipidemia on Tricor. she is not on calcium supplements,  she eats dairy and green, leafy, vegetables on average amounts.  she does not have a family history of hypercalcemia, pituitary tumors, thyroid cancer, or osteoporosis.    ROS:  Constitutional: + Minimally fluctuating body weight,  no fatigue, no subjective hyperthermia, no subjective hypothermia Eyes: no blurry vision, no xerophthalmia   PE: BP 110/64   Pulse 68   Ht 5\' 5"  (1.651 m)   Wt 180 lb 3.2 oz (81.7 kg)   BMI 29.99 kg/m , Body mass index is 29.99 kg/m. Wt Readings from Last 3 Encounters:  03/26/23 180 lb 3.2 oz (81.7 kg)  03/07/23 181 lb 3.5 oz (82.2 kg)  02/20/23 181 lb 6.4 oz (82.3 kg)    Constitutional: + BMI of 30.19, not in acute distress, normal state of mind Eyes: PERRLA, EOMI, no exophthalmos ENT: moist mucous membranes, no gross thyromegaly, no gross cervical lymphadenopathy    CMP ( most recent) CMP     Component Value Date/Time   NA 136 03/09/2023 0400   NA 143 03/11/2018 1034   K 3.6 03/09/2023 0400   CL 105 03/09/2023 0400   CO2 19 (L) 03/09/2023 0400   GLUCOSE 132 (H) 03/09/2023 0400   BUN 10 03/09/2023 0400   BUN 18 03/11/2018 1034   CREATININE 1.08 (H) 03/09/2023 0400   CREATININE 1.06 (H) 08/20/2016 1422   CALCIUM 9.2 03/09/2023 0400   PROT 5.5 (L) 03/09/2023 0400   PROT 6.8 03/11/2018 1034   ALBUMIN 3.0 (L) 03/09/2023 0400   ALBUMIN 4.5 03/11/2018 1034   AST 350 (H) 03/09/2023 0400   ALT 233 (H) 03/09/2023 0400   ALKPHOS 166 (H) 03/09/2023 0400   BILITOT 3.8 (H) 03/09/2023 0400   BILITOT 1.3 (H) 03/11/2018 1034   GFRNONAA 57 (L) 03/09/2023 0400   GFRAA 73 03/11/2018 1034      Lab Results  Component Value Date  TSH 1.13 07/12/2016       Assessment: 1.   Elevated PTH of 88 on the background of CKD  2.  Hypercalcemia of   Plan: Patient continues to have normal glycemia with most recent labs showing calcium of 9.2. -She has osteopenia, and remote past history of nephrolithiasis. -Denies history of fragility fractures. No abdominal pain, no major mood disorders, no bone pain.  - I discussed with the patient about the physiology of calcium and parathyroid hormone, and possible  effects of  increased PTH/ Calcium , including kidney stones, cardiac dysrhythmias, osteoporosis, abdominal pain, etc.   -She will not need intervention by surgery or medications at this time.   She will be put on expectant management until next measurement of PTH/calcium and office visit in 6 months. Her PTH RP is undetectable.  Unfortunately, it is not practical to collect 24-hour urine sample in this patient with urgent continence.  Her DEXA scan was reviewed showing osteopenia of hips.  -Although there is a possibility of secondary hyperparathyroidism in the background of CKD.   She is advised to maintain close follow-up with her PMD.   I spent  23  minutes in the care of the patient today including review of labs from Thyroid Function, CMP, and other relevant labs ; imaging/biopsy records (current and previous including abstractions from other facilities); face-to-face time discussing  her lab results and symptoms, medications doses, her options of short and long term treatment based on the latest standards of care / guidelines;   and documenting the encounter.  [name]  participated in the discussions, expressed understanding, and voiced agreement with the above plans.  All questions were answered to her satisfaction. she is encouraged to contact clinic should she have any questions or concerns prior to her return visit.   - Return in about 6 months (around 09/25/2023) for Fasting Labs  in AM B4 8.   Marquis Lunch, MD [facility] [address] [place], [place] [address] Phone: [phone]  Fax: [phone]    This note was partially dictated with voice recognition software. Similar sounding words can be transcribed inadequately or may not  be corrected upon review.  03/26/2023, 1:55 PM

## 2023-03-26 NOTE — Progress Notes (Signed)
History of Present Illness: Joy Leach is a 67 y.o. female who presents today for follow up visit at [facility]. She is accompanied by her sister. - GU History: 1. Kidney stones. - Prior stone procedures have included ESWL and ureteroscopic stone manipulation.   At last visit with Dr. Ronne Binning on 02/19/2023: - Seen for postop check after right ureteroscopic stone manipulation with stenting on 01/30/2023. She removed her stent POD#3. Doing well. - The plan was follow up in 6 weeks with renal US.  Since last visit: 03/06/2023: CT stone study showed "Multiple renal calculi are noted bilaterally measuring up to 3 mm on the left. No ureteral calculus or obstructive uropathy bilaterally. The bladder is unremarkable." This imaging was performed during workup for abdominal pain with ultimate finding of acute cholecystitis. Underwent laparoscopic cholecystectomy on 03/07/23. She was subsequently admitted 03/09/2023 - 03/13/2023 for choledocholithiasis; underwent ERCP on 03/11/2023 with sludge noted in the common bile duct therefore sphincterotomy performed with biliary stent placed. Being followed by GI with plans for repeat ERCP in 2 months.   Today: She denies recent episode of stone pain / passage. She denies acute flank pain / abdominal pain. She denies fevers.  She denies nausea and has Zofran from her GI provider for as-needed use.  She denies increased urinary urgency, frequency, dysuria, gross hematuria, straining to void, or sensations of incomplete emptying.   Fall Screening: Do you usually have a device to assist in your mobility? No   Medications: Current Outpatient Medications  Medication Sig Dispense Refill   acetaminophen (TYLENOL) 500 MG tablet Take 2 tablets (1,000 mg total) by mouth every 8 (eight) hours as needed for mild pain, fever or headache.     albuterol (VENTOLIN HFA) 108 (90 Base) MCG/ACT inhaler Inhale 1 puff into the lungs every 6 (six) hours as  needed for shortness of breath.     alfuzosin (UROXATRAL) 10 MG 24 hr tablet Take 1 tablet (10 mg total) by mouth daily with breakfast. 30 tablet 0   apixaban (ELIQUIS) 5 MG TABS tablet Take 1 tablet (5 mg total) by mouth 2 (two) times daily. To be resume at the moment of discharge and after all procedures completed.     azelastine (ASTELIN) 0.1 % nasal spray Place 1 spray into both nostrils daily.     azithromycin (ZITHROMAX) 250 MG tablet Take 250 mg by mouth daily.     bisacodyl (DULCOLAX) 10 MG suppository Place 1 suppository (10 mg total) rectally daily.     diphenhydrAMINE (BENADRYL) 12.5 MG/5ML elixir Take 5 mLs (12.5 mg total) by mouth every 8 (eight) hours as needed for itching or allergies.     ethambutol (MYAMBUTOL) 400 MG tablet Take 1,200 mg by mouth every Monday, Wednesday, and Friday.     ezetimibe (ZETIA) 10 MG tablet Take 10 mg by mouth daily.     fenofibrate (TRICOR) 145 MG tablet Take 145 mg by mouth every evening.     ferrous sulfate 325 (65 FE) MG EC tablet Take 1 tablet (325 mg total) by mouth daily with breakfast. Resume at time of discharge     furosemide (LASIX) 20 MG tablet TAKE (1) TABLET BY MOUTH ONCE DAILY AS NEEDED. (Patient taking differently: Take 5 mg by mouth daily as needed (leg swelling). Half of 10 mg) 30 tablet 0   hydrocortisone (ANUSOL-HC) 2.5 % rectal cream Place 1 Application rectally 4 (four) times daily. Use four times per day x10 days then PRN thereafter (Patient  taking differently: Place 1 Application rectally daily as needed for hemorrhoids or anal itching.) 56 g 1   imiquimod (ALDARA) 5 % cream Apply 0.25 g topically 3 (three) times a week. Resume at discharge.     insulin aspart (NOVOLOG) 100 UNIT/ML injection Inject 0-9 Units into the skin 3 (three) times daily with meals.     linezolid (ZYVOX) 600 MG tablet Take 600 mg by mouth daily.     meclizine (ANTIVERT) 25 MG tablet Take 25 mg by mouth 3 (three) times daily as needed for dizziness or nausea.      Multiple Vitamin (MULTIVITAMIN) tablet Take 1 tablet by mouth in the morning.     ondansetron (ZOFRAN) 4 MG tablet Take 1 tablet (4 mg total) by mouth every 8 (eight) hours as needed for nausea or vomiting. 30 tablet 0   ondansetron (ZOFRAN-ODT) 4 MG disintegrating tablet Take 1 tablet (4 mg total) by mouth every 8 (eight) hours as needed for nausea or vomiting. 30 tablet 0   oxyCODONE (OXY IR/ROXICODONE) 5 MG immediate release tablet Take 1 tablet (5 mg total) by mouth every 6 (six) hours as needed for moderate pain.     pantoprazole (PROTONIX) 40 MG tablet TAKE ONE TABLET BY MOUTH EVERY DAY 90 tablet 2   polyvinyl alcohol (LIQUIFILM TEARS) 1.4 % ophthalmic solution Place 1 drop into both eyes as needed for dry eyes.     predniSONE 5 MG TBEC Take 5 mg by mouth daily with breakfast.     pregabalin (LYRICA) 75 MG capsule Take 75 mg by mouth every 8 (eight) hours.     No current facility-administered medications for this visit.    Allergies: Allergies  Allergen Reactions   Sulfa Antibiotics Hives   Caffeine Other (See Comments)    Bladder infections   Codeine Nausea And Vomiting    Past Medical History:  Diagnosis Date   Chronic kidney disease    Diabetes mellitus without complication (HCC)    H/O blood clots    Headache    History of hypertension    Hyperlipidemia    Lupus (HCC)    Mycobacterial disease    Sicca (HCC)    Past Surgical History:  Procedure Laterality Date   ABDOMINAL HYSTERECTOMY     BIOPSY  10/26/2021   Procedure: BIOPSY;  Surgeon: Dolores Frame, MD;  [facility];  Service: Gastroenterology;;   BIOPSY  04/02/2022   Procedure: BIOPSY;  Surgeon: Dolores Frame, MD;  [facility];  Service: Gastroenterology;;   CHOLECYSTECTOMY N/A 03/07/2023   Procedure: LAPAROSCOPIC CHOLECYSTECTOMY;  Surgeon: Lewie Chamber, DO;  Location: [facility];  Service: General;  Laterality: N/A;   COLONOSCOPY  05/26/2015   Dr Teena Dunk,  mild diverticulosis in sigmoid colon, biosies taken from [facility] and sigmoid colon-negative   COLONOSCOPY WITH PROPOFOL N/A 04/02/2022   Procedure: COLONOSCOPY WITH PROPOFOL;  Surgeon: Dolores Frame, MD;  [facility];  Service: Gastroenterology;  Laterality: N/A;  815   CYSTOSCOPY WITH RETROGRADE PYELOGRAM, URETEROSCOPY AND STENT PLACEMENT Right 01/30/2023   Procedure: CYSTOSCOPY WITH RETROGRADE PYELOGRAM, URETEROSCOPY AND STENT PLACEMENT;  Surgeon: Malen Gauze, MD;  Location: [facility];  Service: Urology;  Laterality: Right;   ESOPHAGOGASTRODUODENOSCOPY (EGD) WITH PROPOFOL N/A 10/26/2021   Procedure: ESOPHAGOGASTRODUODENOSCOPY (EGD) WITH PROPOFOL;  Surgeon: Dolores Frame, MD;  [facility];  Service: Gastroenterology;  Laterality: N/A;  1045   EXTRACORPOREAL SHOCK WAVE LITHOTRIPSY Right 12/03/2022   Procedure: EXTRACORPOREAL SHOCK  WAVE LITHOTRIPSY (ESWL);  Surgeon: Malen Gauze, MD;  Location: [facility];  Service: Urology;  Laterality: Right;   HEMOSTASIS CLIP PLACEMENT  10/26/2021   Procedure: HEMOSTASIS CLIP PLACEMENT;  Surgeon: Dolores Frame, MD;  [facility];  Service: Gastroenterology;;   HOLMIUM LASER APPLICATION Right 01/30/2023   Procedure: HOLMIUM LASER APPLICATION;  Surgeon: Malen Gauze, MD;  Location: [facility];  Service: Urology;  Laterality: Right;   HOT HEMOSTASIS  10/26/2021   Procedure: HOT HEMOSTASIS (ARGON PLASMA COAGULATION/BICAP);  Surgeon: Marguerita Merles, Reuel Boom, MD;  [facility];  Service: Gastroenterology;;   hystorectomy     POLYPECTOMY  10/26/2021   Procedure: POLYPECTOMY;  Surgeon: Dolores Frame, MD;  [facility];  Service: Gastroenterology;;   POLYPECTOMY  04/02/2022   Procedure: POLYPECTOMY;  Surgeon: Marguerita Merles, Reuel Boom, MD;  [facility];  Service: Gastroenterology;;   SINUS SURGERY WITH INSTATRAK  2015   Family History  Problem  Relation Age of Onset   Hypertension Mother    Diabetes Mother    Kidney disease Mother    Heart disease Mother    High Cholesterol Mother    Lung cancer Mother    Hypertension Father    Diabetes Father    Kidney disease Father    Heart disease Father    Social History   Socioeconomic History   Marital status: Divorced    Spouse name: Not on file   Number of children: 1   Years of education: Not on file   Highest education level: Not on file  Occupational History   Not on file  Tobacco Use   Smoking status: Never    Passive exposure: Never   Smokeless tobacco: Never  Vaping Use   Vaping Use: Never used  Substance and Sexual Activity   Alcohol use: No   Drug use: No   Sexual activity: Not Currently    Partners: Male  Other Topics Concern   Not on file  Social History Narrative   Lives home alone.  Widow.    Is Disabled.  One child.  Sister, Chip Boer.   Social Determinants of Health   Financial Resource Strain: Not on file  Food Insecurity: No Food Insecurity (03/06/2023)   Hunger Vital Sign    Worried About Running Out of Food in the Last Year: Never true    Ran Out of Food in the Last Year: Never true  Transportation Needs: No Transportation Needs (03/06/2023)   PRAPARE - Administrator, Civil Service (Medical): No    Lack of Transportation (Non-Medical): No  Physical Activity: Not on file  Stress: Not on file  Social Connections: Not on file  Intimate Partner Violence: Not At Risk (03/06/2023)   Humiliation, Afraid, Rape, and Kick questionnaire    Fear of Current or Ex-Partner: No    Emotionally Abused: No    Physically Abused: No    Sexually Abused: No    SUBJECTIVE  Review of Systems Constitutional: Patient denies any unintentional weight loss or change in strength lntegumentary: Patient denies any rashes or pruritus Eyes: Patient denies dry eyes ENT: Patient denies dry mouth Cardiovascular: Patient denies chest pain or syncope Respiratory:  Patient denies shortness of breath Gastrointestinal: Patient denies nausea, vomiting, constipation, or diarrhea Musculoskeletal: Patient denies muscle cramps or weakness Neurologic: Patient denies convulsions or seizures Psychiatric: Patient denies memory problems Allergic/Immunologic: Patient denies recent allergic reaction(s) Hematologic/Lymphatic: Patient denies bleeding tendencies Endocrine: Patient denies heat/cold intolerance  GU: As per HPI.  OBJECTIVE Vitals:   03/27/23 1518  BP: 102/68  Pulse: 80  Temp: 98.3 F (36.8 C)   There is no height or weight on file to calculate BMI.  Physical Examination  Constitutional: No obvious distress; patient is non-toxic appearing  Cardiovascular: No visible lower extremity edema.  Respiratory: The patient does not have audible wheezing/stridor; respirations do not appear labored  Gastrointestinal: Abdomen non-distended Musculoskeletal: Normal ROM of UEs  Skin: No obvious rashes/open sores  Neurologic: CN 2-12 grossly intact Psychiatric: Answered questions appropriately with normal affect  Hematologic/Lymphatic/Immunologic: No obvious bruises or sites of spontaneous bleeding  UA: positive for 3-10 RBC/hpf  ASSESSMENT Kidney stones - Plan: Urinalysis, Routine w reflex microscopic, DG Abd 1 View  Microscopic hematuria  We reviewed recent imaging results; no acute findings.  For stone prevention: Advised adequate hydration and we discussed option to consider low oxalate diet given that calcium oxalate is the most common type of stone. Handout provided about stone prevention diet.  We discussed find of microscopic hematuria per today's UA. Not acutely concerning given that her CT abdomen/pelvis w/wo contrast on 11/27/2022 showed no evidence of malignancy and there were also no acute findings at the time of her cystoscopy during her surgery on 01/30/2023 by Dr. Ronne Binning. Her renal function was acceptable on 03/09/2023. Suspect the  microscopic hematuria may be either related to her Lupus + anticoagulant use (Eliquis) or perhaps vaginal atrophy.   Will plan to follow up in 6 months with [facility] for stone surveillance or sooner if needed. Pt verbalized understanding and agreement. All questions were answered.  PLAN Advised the following: Maintain adequate fluid intake. Low oxalate diet. Return in about 6 months (around 09/26/2023) for [facility], UA, & f/u with Evette Georges NP.  Orders Placed This Encounter  Procedures   DG Abd 1 View    Standing Status:   Future    Standing Expiration Date:   03/26/2024    Order Specific Question:   Reason for Exam (SYMPTOM  OR DIAGNOSIS REQUIRED)    Answer:   kidney stone    Order Specific Question:   Preferred imaging location?    Answer:   [facility]   Urinalysis, Routine w reflex microscopic    It has been explained that the patient is to follow regularly with their PCP in addition to all other providers involved in their care and to follow instructions provided by these respective offices. Patient advised to contact urology clinic if any urologic-pertaining questions, concerns, new symptoms or problems arise in the interim period.  Patient Instructions  >80% of stones are calcium oxalate. This type of stones forms when body either isn't clearing oxalate well enough, is making too much oxalate, or too little citrate. This results in oxalate binding to form crystals, which continue to aggregate and form stones.  Limiting calcium does not help, but limiting oxalate in the diet can help. Increasing citric acid intake may also help.  The following measures may help to prevent the recurrence of stones: Increase water intake to 2-2.5 liters per day May add citrus juice (lemon, lime or orange juice) to water Moderation in dairy foods Decrease in salt content Low Oxalate diet: Oxylates are found in foods like Tomato, Spinach, red wine and chocolate (see additional resources  below).  Internet resources for information regarding low oxalate diet:  https://kidneystones.yangchunwu.com https://my.VerticalStretch.be  Foods Low in Sodium or Oxalate Foods You Can Eat  Drinks Coffee, fruit and veggie juice (using the  recommended veggies), fruit punch  Fruits Apples, apricots (fresh or canned), avocado, bananas, cherries (sweet), cranberries, grapefruit, red or green grapes, lemon and lime juice, melons, nectarines, papayas, peaches, pears, pineapples, oranges, strawberries (fresh), tangerines  Veggies Artichokes, asparagus, bamboo shoots, broccoli, brussels sprouts, cabbage, cauliflower, chayote squash, chicory, corn, cucumbers, endive, lettuce, lima beans, mushrooms, onions, peas, peppers, potatoes, radishes, rutabagas, zucchini  Breads, Cereals, Grains Egg noodles, rye bread, cooked and dry cereals without nuts or bran, crackers with unsalted tops, white or wild rice  Meat, Meat Replacements, Fish, Recruitment consultant, fish, poultry, eggs, egg whites, egg replacements  Soup Homemade soup (using the recommended veggies and meat), low-sodium bouillon, low-sodium canned  Desserts Cookies, cakes, ice cream, pudding without chocolate or nuts, candy without chocolate or nuts  Fats and Oils Butter, margarine, cream, oil, salad dressing, mayo  Other Foods Unsalted potato chips or pretzels, herbs (like garlic, garlic powder, onion powder), lemon juice, salt-free seasoning blends, vinegar  Other Foods Low in Oxalate Foods You Can Eat  Drinks Beer, cola, wine, buttermilk, lemonade or limeade (without added vitamin C), milk  Meat, Meat Replacements, Fish, [employer] meat, ham, bacon, hot dogs, bratwurst, sausage, chicken nuggets, cheddar cheese, canned fish and shellfish  Soup Tomato soup, cheese soup  Other Foods Coconuts, lemon or lime juices, sugar or sweeteners, jellies or jams (from the  recommended list)   Moderate-Oxalate Foods Foods to Limit   Drinks Fruit and veggie juices (from the list below), chocolate milk, rice milk, hot cocoa, tea   Fruits Blackberries, blueberries, black currants, cherries (sour), fruit cocktail, mangoes, orange peel, prunes, purple plums   Veggies Baked beans, carrots, celery, green beans, parsnips, summer squash, tomatoes, turnips   Breads, Cereals, Grains White bread, cornbread or cornmeal, white English muffins, saltine or soda crackers, brown rice, vanilla wafers, spaghetti and other noodles, firm tofu, bagels, oatmeal   Meat/meat replacements, fish, poultry Sardines   Desserts Chocolate cake   Fats and Oils Macadamia nuts, pistachio nuts, English walnuts   Other Foods Jams or jellies (made with the fruits above), pepper   High-Oxalate Foods Foods to Avoid Drinks Chocolate drink mixes, soy milk, Ovaltine, instant iced tea, fruit juices of fruits listed below Fruits Apricots (dried), red currants, figs, kiwi, plums, rhubarb Veggies Beans (wax, dried), beets and beet greens, chives, collard greens, eggplant, escarole, dark greens of all kinds, leeks, okra, parsley, rutabagas, spinach, Swiss chard, tomato paste, watercress Breads, Cereals, Grains Amaranth, barley, white corn flour, fried potatoes, fruitcake, grits, soybean products, sweet potatoes, wheat germ and bran, buckwheat flour, All Bran cereal, graham crackers, pretzels, whole wheat bread Meat/meat replacements, fish, poultry Dried beans, peanut butter, soy burgers, miso Desserts Carob, chocolate, marmalades Fats and Oils Nuts (peanuts, almonds, pecans, cashews, hazelnuts), nut butters, sesame seeds, tahini paste Other Foods Poppy seeds   Electronically signed by:  Donnita Falls, MSN, FNP-C, CUNP 03/27/2023 5:19 PM

## 2023-03-26 NOTE — Progress Notes (Signed)
[facility] Note   HPI:  67 y.o. Female presents to clinic for post-op follow-up status post laparoscopic cholecystectomy on 5/24 with subsequent transfer to [facility] for ERCP.  Patient states that she did undergo ERCP at [facility], at which time they found stones and sludge in her common bile duct.  She had a stent placed at that time and will need to return for ERCP for removal of stent.  Patient has overall been doing well since the surgery.  She does have intermittent nausea and vomiting that seems to be random and not associated with anything in particular.  She denies significant abdominal pain.  Denies issues at her incision sites.  Denies fevers and chills.  Review of Systems:  All other review of systems: otherwise negative   Vital Signs:  BP 105/72   Pulse 84   Temp 98 F (36.7 C) (Oral)   Resp 12   Ht 5\' 5"  (1.651 m)   Wt 182 lb (82.6 kg)   SpO2 95%   BMI 30.29 kg/m    Physical Exam:  Physical Exam Vitals reviewed.  Constitutional:      Appearance: Normal appearance.  Abdominal:     Comments: Abdomen soft, nondistended, no percussion tenderness, nontender to palpation; no rigidity, guarding, rebound tenderness; incisions healing well, mild fullness at epigastric incision site  Neurological:     Mental Status: She is alert.     Laboratory studies: None  Imaging:  None  Pathology: A. GALLBLADDER, CHOLECYSTECTOMY:  -  Chronic cholecystitis and cholelithiasis.   Assessment:  67 y.o. yo Female who presents for follow-up status post laparoscopic cholecystectomy on 5/24 with subsequent transfer to [facility] for ERCP secondary to choledocholithiasis.  Plan:  -Patient is overall doing well from a surgical standpoint-tolerating a diet, moving her bowels, and pain adequately controlled -I advised that she is far enough out from surgery that I would not expect her to continue to have nausea and vomiting related to her surgery.  She does  have upcoming follow-up with GI scheduled -Prescription provided for Zofran -Follow up as needed  All of the above recommendations were discussed with the patient and patient's family, and all of patient's and family's questions were answered to their expressed satisfaction.  Theophilus Kinds, DO [facility] [address] [place], [place] [address] [phone] (office)

## 2023-03-27 ENCOUNTER — Ambulatory Visit (INDEPENDENT_AMBULATORY_CARE_PROVIDER_SITE_OTHER): Payer: Medicare HMO | Admitting: Urology

## 2023-03-27 ENCOUNTER — Encounter: Payer: Self-pay | Admitting: Urology

## 2023-03-27 VITALS — BP 102/68 | HR 80 | Temp 98.3°F

## 2023-03-27 DIAGNOSIS — R3129 Other microscopic hematuria: Secondary | ICD-10-CM

## 2023-03-27 DIAGNOSIS — N200 Calculus of kidney: Secondary | ICD-10-CM | POA: Diagnosis not present

## 2023-03-27 NOTE — Patient Instructions (Signed)

## 2023-03-28 LAB — MICROSCOPIC EXAMINATION: Bacteria, UA: NONE SEEN

## 2023-03-28 LAB — URINALYSIS, ROUTINE W REFLEX MICROSCOPIC
Bilirubin, UA: NEGATIVE
Glucose, UA: NEGATIVE
Ketones, UA: NEGATIVE
Nitrite, UA: NEGATIVE
Protein,UA: NEGATIVE
RBC, UA: NEGATIVE
Specific Gravity, UA: 1.03 (ref 1.005–1.030)
Urobilinogen, Ur: 0.2 mg/dL (ref 0.2–1.0)
pH, UA: 5.5 (ref 5.0–7.5)

## 2023-06-30 ENCOUNTER — Encounter (INDEPENDENT_AMBULATORY_CARE_PROVIDER_SITE_OTHER): Payer: Self-pay | Admitting: Gastroenterology

## 2023-06-30 ENCOUNTER — Ambulatory Visit (INDEPENDENT_AMBULATORY_CARE_PROVIDER_SITE_OTHER): Payer: Medicare HMO | Admitting: Gastroenterology

## 2023-06-30 VITALS — BP 118/77 | HR 73 | Temp 97.1°F | Ht 65.0 in | Wt 189.6 lb

## 2023-06-30 DIAGNOSIS — K649 Unspecified hemorrhoids: Secondary | ICD-10-CM | POA: Diagnosis not present

## 2023-06-30 DIAGNOSIS — K641 Second degree hemorrhoids: Secondary | ICD-10-CM

## 2023-06-30 MED ORDER — PHENYLEPHRINE IN HARD FAT 0.25 % RE SUPP
1.0000 | Freq: Two times a day (BID) | RECTAL | 0 refills | Status: DC
Start: 2023-06-30 — End: 2023-09-29

## 2023-06-30 NOTE — Progress Notes (Unsigned)
Joy Leach, M.D. Gastroenterology & Hepatology [facility] [address] [place], [place] [address]  Primary Care Physician: Pollyann Savoy, NP [address] [place] [address]  I will communicate my assessment and recommendations to the referring MD via EMR.  Problems: Rectal bleeding secondary to hemorrhoids  History of Present Illness: Joy Leach is a 67 y.o. 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  The patient was last seen on 12/23/2022. At that time, the patient was continued on pantoprazole 40 mg a day and was advised to take Zofran as needed.  Was also advised to keep taking daily probiotics.  She was given a pamphlet for TIF procedure.  Notably, the patient was admitted to the hospital in late May 2024 she was admitted to [facility] after presenting worsening abdominal pain which was concerning for cholelithiasis and cholecystitis.  She underwent cholecystectomy, no intraoperative cholangiogram could be performed.  Due to this MRCP was performed which showed a tiny calculus at the ampulla.  Given presence of abnormalities on HIDA scan concerning for CBD obstruction, the patient had to be transferred to [facility] for endoscopic retrograde cholangiopancreatography.  This was performed on 03/11/2023 by Dr. Aviva Kluver.  CBD measuring 11 mm and there was no presence of filling defects in the bile duct, with normal intrahepatic ducts.  The biliary tree was swept with a 12 mm balloon and some sludge was removed.  Notably, the patient is following with [facility] infectious disease department for chronic mycobacterial infection.  She has been on azithromycin and renally dosed ethambutol and linezolid for this. Patient states she possibly is cured from this now.  Patient reports that she has presented episodes  of rectal bleeding for the last couple of weeks.She felt some pressure in the lower abdomen without pain. Sates the bleeding was large in amount and last time she bled was on Tuesday. She reports that she has fluctuation in her bowel movements, as sometimes can have diarrhea and sometimes constipation. She has to strain to have a bowel movement. Took Miralax in the past but was feeling soreness in the abdomen with this.   She has had chronic nausea for the last 2 years. She does not vomit. Zofran helps control the nausea some but does not completely suppress the nausea.  The patient denies having any fever, chills, hematochezia, melena, hematemesis, abdominal distention, abdominal pain, diarrhea, jaundice, pruritus or weight loss.  Last Colonoscopy:04/02/22 - Three 3 to 5 mm polyps in the ascending colon and in the cecum - One 2 mm polyp in the ascending colon - Two 4 to 5 mm polyps in the transverse colon  - Diverticulosis in the sigmoid colon. Biopsied. - Congested mucosa in the rectum. normal colon Biopsied. - Non-bleeding external and internal hemorrhoids. - Hemorrhoids found on perianal exam. (6 TAs, normal colonic biopsies) Last Endoscopy:10/26/21 normal esophagus  - Multiple gastric polyps. Fundic gland polyps - rest of the examined stomach was normal. Biopsied-normal - Normal duodenum. Biopsied-normal     Repeat Colonoscopy 3 years   Past Medical History: Past Medical History:  Diagnosis Date   Chronic kidney disease    Diabetes mellitus without complication (HCC)    H/O blood clots    Headache    History of hypertension    Hyperlipidemia    Lupus (HCC)    Mycobacterial disease  [facility])     Past Surgical History: Past Surgical History:  Procedure Laterality Date   ABDOMINAL HYSTERECTOMY     BIOPSY  10/26/2021   Procedure: BIOPSY;  Surgeon: Dolores Frame, MD;  [facility];  Service: Gastroenterology;;   BIOPSY  04/02/2022   Procedure:  BIOPSY;  Surgeon: Dolores Frame, MD;  [facility];  Service: Gastroenterology;;   CHOLECYSTECTOMY N/A 03/07/2023   Procedure: LAPAROSCOPIC CHOLECYSTECTOMY;  Surgeon: Lewie Chamber, DO;  Location: [facility];  Service: General;  Laterality: N/A;   COLONOSCOPY  05/26/2015   Dr Teena Dunk, mild diverticulosis in sigmoid colon, biosies taken from [facility] and sigmoid colon-negative   COLONOSCOPY WITH PROPOFOL N/A 04/02/2022   Procedure: COLONOSCOPY WITH PROPOFOL;  Surgeon: Dolores Frame, MD;  [facility];  Service: Gastroenterology;  Laterality: N/A;  815   CYSTOSCOPY WITH RETROGRADE PYELOGRAM, URETEROSCOPY AND STENT PLACEMENT Right 01/30/2023   Procedure: CYSTOSCOPY WITH RETROGRADE PYELOGRAM, URETEROSCOPY AND STENT PLACEMENT;  Surgeon: Malen Gauze, MD;  Location: [facility];  Service: Urology;  Laterality: Right;   ESOPHAGOGASTRODUODENOSCOPY (EGD) WITH PROPOFOL N/A 10/26/2021   Procedure: ESOPHAGOGASTRODUODENOSCOPY (EGD) WITH PROPOFOL;  Surgeon: Dolores Frame, MD;  [facility];  Service: Gastroenterology;  Laterality: N/A;  1045   EXTRACORPOREAL SHOCK WAVE LITHOTRIPSY Right 12/03/2022   Procedure: EXTRACORPOREAL SHOCK WAVE LITHOTRIPSY (ESWL);  Surgeon: Malen Gauze, MD;  Location: [facility];  Service: Urology;  Laterality: Right;   HEMOSTASIS CLIP PLACEMENT  10/26/2021   Procedure: HEMOSTASIS CLIP PLACEMENT;  Surgeon: Dolores Frame, MD;  [facility];  Service: Gastroenterology;;   HOLMIUM LASER APPLICATION Right 01/30/2023   Procedure: HOLMIUM LASER APPLICATION;  Surgeon: Malen Gauze, MD;  Location: [facility];  Service: Urology;  Laterality: Right;   HOT HEMOSTASIS  10/26/2021   Procedure: HOT HEMOSTASIS (ARGON PLASMA COAGULATION/BICAP);  Surgeon: Marguerita Merles, Reuel Boom, MD;  [facility];  Service: Gastroenterology;;   hystorectomy     POLYPECTOMY  10/26/2021   Procedure: POLYPECTOMY;   Surgeon: Dolores Frame, MD;  [facility];  Service: Gastroenterology;;   POLYPECTOMY  04/02/2022   Procedure: POLYPECTOMY;  Surgeon: Marguerita Merles, Reuel Boom, MD;  [facility];  Service: Gastroenterology;;   SINUS SURGERY WITH INSTATRAK  2015    Family History: Family History  Problem Relation Age of Onset   Hypertension Mother    Diabetes Mother    Kidney disease Mother    Heart disease Mother    High Cholesterol Mother    Lung cancer Mother    Hypertension Father    Diabetes Father    Kidney disease Father    Heart disease Father     Social History: Social History   Tobacco Use  Smoking Status Never   Passive exposure: Never  Smokeless Tobacco Never   Social History   Substance and Sexual Activity  Alcohol Use No   Social History   Substance and Sexual Activity  Drug Use No    Allergies: Allergies  Allergen Reactions   Sulfa Antibiotics Hives   Caffeine Other (See Comments)    Bladder infections   Codeine Nausea And Vomiting    Medications: Current Outpatient Medications  Medication Sig Dispense Refill   acetaminophen (TYLENOL) 500 MG tablet Take 2 tablets (1,000 mg total) by mouth every 8 (eight) hours as needed for mild pain, fever or headache.     albuterol (VENTOLIN HFA) 108 (90 Base) MCG/ACT inhaler Inhale  1 puff into the lungs every 6 (six) hours as needed for shortness of breath.     apixaban (ELIQUIS) 5 MG TABS tablet Take 1 tablet (5 mg total) by mouth 2 (two) times daily. To be resume at the moment of discharge and after all procedures completed.     azelastine (ASTELIN) 0.1 % nasal spray Place 1 spray into both nostrils daily.     ergocalciferol (VITAMIN D2) 1.25 MG (50000 UT) capsule Take 50,000 Units by mouth every 14 (fourteen) days.     ezetimibe (ZETIA) 10 MG tablet Take 10 mg by mouth daily.     fenofibrate (TRICOR) 145 MG tablet Take 145 mg by mouth every evening.     ferrous sulfate 325 (65 FE) MG EC  tablet Take 1 tablet (325 mg total) by mouth daily with breakfast. Resume at time of discharge     furosemide (LASIX) 20 MG tablet TAKE (1) TABLET BY MOUTH ONCE DAILY AS NEEDED. (Patient taking differently: Take 5 mg by mouth daily as needed (leg swelling). Half of 10 mg) 30 tablet 0   hydrocortisone (ANUSOL-HC) 2.5 % rectal cream Place 1 Application rectally 4 (four) times daily. Use four times per day x10 days then PRN thereafter (Patient taking differently: Place 1 Application rectally daily as needed for hemorrhoids or anal itching.) 56 g 1   levocetirizine (XYZAL) 5 MG tablet Take 5 mg by mouth every evening.     loperamide (IMODIUM A-D) 2 MG tablet Take 2 mg by mouth 2 (two) times daily as needed for diarrhea or loose stools.     Multiple Vitamin (MULTIVITAMIN) tablet Take 1 tablet by mouth in the morning.     ondansetron (ZOFRAN-ODT) 4 MG disintegrating tablet Take 1 tablet (4 mg total) by mouth every 8 (eight) hours as needed for nausea or vomiting. 30 tablet 0   pantoprazole (PROTONIX) 40 MG tablet TAKE ONE TABLET BY MOUTH EVERY DAY 90 tablet 2   pioglitazone (ACTOS) 15 MG tablet Take 15 mg by mouth daily.     polyvinyl alcohol (LIQUIFILM TEARS) 1.4 % ophthalmic solution Place 1 drop into both eyes as needed for dry eyes.     predniSONE 5 MG TBEC Take 5 mg by mouth daily with breakfast.     pregabalin (LYRICA) 75 MG capsule Take 75 mg by mouth every 8 (eight) hours.     sitaGLIPtin-metformin (JANUMET) 50-1000 MG tablet Take 1 tablet by mouth 2 (two) times daily with a meal.     No current facility-administered medications for this visit.    Review of Systems: GENERAL: negative for malaise, night sweats HEENT: No changes in hearing or vision, no nose bleeds or other nasal problems. NECK: Negative for lumps, goiter, pain and significant neck swelling RESPIRATORY: Negative for cough, wheezing CARDIOVASCULAR: Negative for chest pain, leg swelling, palpitations, orthopnea GI: SEE  HPI MUSCULOSKELETAL: Negative for joint pain or swelling, back pain, and muscle pain. SKIN: Negative for lesions, rash PSYCH: Negative for sleep disturbance, mood disorder and recent psychosocial stressors. HEMATOLOGY Negative for prolonged bleeding, bruising easily, and swollen nodes. ENDOCRINE: Negative for cold or heat intolerance, polyuria, polydipsia and goiter. NEURO: negative for tremor, gait imbalance, syncope and seizures. The remainder of the review of systems is noncontributory.   Physical Exam: BP 118/77 (BP Location: Left Arm, Patient Position: Sitting, Cuff Size: Normal)   Pulse 73   Temp (!) 97.1 F (36.2 C) (Temporal)   Ht 5\' 5"  (1.651 m)   Wt 189 lb 9.6 oz (  86 kg)   BMI 31.55 kg/m  GENERAL: The patient is AO x3, in no acute distress. HEENT: Head is normocephalic and atraumatic. EOMI are intact. Mouth is well hydrated and without lesions. NECK: Supple. No masses LUNGS: Clear to auscultation. No presence of rhonchi/wheezing/rales. Adequate chest expansion HEART: RRR, normal s1 and s2. ABDOMEN: Soft, nontender, no guarding, no peritoneal signs, and nondistended. BS +. No masses. RECTAL EXAM: small skin tags, there is presence of small internal hemorrhoids protruding from anal canal mildly tender, normal tone, no masses, brown stool without blood. Chaperone: Alain Honey, CMA EXTREMITIES: Without any cyanosis, clubbing, rash, lesions or edema. NEUROLOGIC: AOx3, no focal motor deficit. SKIN: no jaundice, no rashes  Imaging/Labs: as above  I personally reviewed and interpreted the available labs, imaging and endoscopic files.  Impression and Plan: Joy Leach is a 67 y.o. 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  The patient has presented recurrent intermittent episodes of rectal bleeding.  Had recent endoscopic evaluation a year ago that  did not show any masses or vascular abnormalities.  Physical exam today shows presence of small internal hemorrhoids which are mildly tender to palpation, likely the source of the rectal bleeding.  I encouraged the patient to improve her bowel movement regimen by taking a combination of Senokot and docusate daily to aid her defecation and decrease the strain effort.  I also advised her to take some Preparation H for next week.  If this fails to improve her symptoms and she has recurrent rectal bleeding, we will consider doing some hemorrhoidal banding although she may be at increased risk of bleeding given her chronic anticoagulation.  -Start Sennakot stool softener 2 pills per day -Take docusate 100 mg qday -Start Preparation H every 12 hours for 1 week  All questions were answered.      Joy Blazing, MD Gastroenterology and Hepatology [facility]

## 2023-06-30 NOTE — Patient Instructions (Addendum)
Start Sennakot stool softener 2 pills per day Take docusate 100 mg qday Start Preparation H every 12 hours for 1 week

## 2023-07-02 DIAGNOSIS — M329 Systemic lupus erythematosus, unspecified: Secondary | ICD-10-CM | POA: Insufficient documentation

## 2023-08-23 ENCOUNTER — Other Ambulatory Visit (INDEPENDENT_AMBULATORY_CARE_PROVIDER_SITE_OTHER): Payer: Self-pay | Admitting: Gastroenterology

## 2023-09-22 LAB — BASIC METABOLIC PANEL
BUN: 20 (ref 4–21)
CO2: 25 — AB (ref 13–22)
Chloride: 106 (ref 99–108)
Creatinine: 1.6 — AB (ref 0.5–1.1)
Potassium: 4.1 meq/L (ref 3.5–5.1)
Sodium: 144 (ref 137–147)

## 2023-09-22 LAB — LIPID PANEL
A1c: 5.7
Cholesterol: 139 (ref 0–200)
EGFR: 1.6
HDL: 44 (ref 35–70)
LDL Cholesterol: 70
Triglycerides: 141 (ref 40–160)

## 2023-09-22 LAB — COMPREHENSIVE METABOLIC PANEL
Albumin: 4.1 (ref 3.5–5.0)
Calcium: 10.8 — AB (ref 8.7–10.7)
Globulin: 1.8

## 2023-09-22 LAB — VITAMIN D 25 HYDROXY (VIT D DEFICIENCY, FRACTURES): Vit D, 25-Hydroxy: 83.5

## 2023-09-22 LAB — HEPATIC FUNCTION PANEL
ALT: 17 U/L (ref 7–35)
AST: 25 (ref 13–35)
Alkaline Phosphatase: 72 (ref 25–125)
Bilirubin, Total: 0.4

## 2023-09-25 ENCOUNTER — Ambulatory Visit: Payer: Medicare HMO | Admitting: "Endocrinology

## 2023-09-25 NOTE — Progress Notes (Signed)
Name: Joy Leach DOB: Apr 22, 1956 MRN: [id number]  History of Present Illness: Joy Leach is a 67 y.o. female who presents today for follow up visit at [facility] [place]. She is accompanied by her sister Joy Leach. - GU History: 1. Kidney stones. - Prior stone procedures have included ESWL and ureteroscopic stone manipulation.  - 01/30/2023: Underwent right ureteroscopic stone manipulation by Dr. Ronne Binning. - 03/06/2023: CT stone showed non-obstructing <3 mm bilateral nephrolithiasis.  At last visit on 03/27/2023: - Discussed stone prevention. - UA positive for 3-10 RBC/hpf. - Microscopic hematuria not acutely concerning given that her CT abdomen/pelvis w/wo contrast on 11/27/2022 showed no evidence of malignancy and there were also no acute findings at the time of her cystoscopy during her surgery on 01/30/2023 by Dr. Ronne Binning. Her renal function was acceptable on 03/09/2023.  - Suspect the microscopic hematuria may be either related to her Lupus + anticoagulant use (Eliquis) or perhaps vaginal atrophy.  Today: KUB today: Awaiting radiology read; no stones appreciated per provider interpretation.  She denies recent stone passage. She denies flank pain or abdominal pain. She denies increased urinary urgency, frequency, nocturia, dysuria, gross hematuria, hesitancy, straining to void, or sensations of incomplete emptying.   Fall Screening: Do you usually have a device to assist in your mobility? No   Medications: Current Outpatient Medications  Medication Sig Dispense Refill   acetaminophen (TYLENOL) 500 MG tablet Take 2 tablets (1,000 mg total) by mouth every 8 (eight) hours as needed for mild pain, fever or headache.     albuterol (VENTOLIN HFA) 108 (90 Base) MCG/ACT inhaler Inhale 1 puff into the lungs every 6 (six) hours as needed for shortness of breath.     amoxicillin-clavulanate (AUGMENTIN) 875-125 MG tablet Take 1 tablet by mouth 2 (two) times daily.     apixaban  (ELIQUIS) 5 MG TABS tablet Take 1 tablet (5 mg total) by mouth 2 (two) times daily. To be resume at the moment of discharge and after all procedures completed.     azelastine (ASTELIN) 0.1 % nasal spray Place 1 spray into both nostrils daily.     ergocalciferol (VITAMIN D2) 1.25 MG (50000 UT) capsule Take 50,000 Units by mouth every 14 (fourteen) days.     ezetimibe (ZETIA) 10 MG tablet Take 10 mg by mouth daily.     fenofibrate (TRICOR) 145 MG tablet Take 145 mg by mouth every evening.     ferrous sulfate 325 (65 FE) MG EC tablet Take 1 tablet (325 mg total) by mouth daily with breakfast. Resume at time of discharge     furosemide (LASIX) 20 MG tablet TAKE (1) TABLET BY MOUTH ONCE DAILY AS NEEDED. (Patient taking differently: Take 5 mg by mouth daily as needed (leg swelling). Half of 10 mg) 30 tablet 0   hydrocortisone (ANUSOL-HC) 2.5 % rectal cream Place 1 Application rectally 4 (four) times daily. Use four times per day x10 days then PRN thereafter (Patient taking differently: Place 1 Application rectally daily as needed for hemorrhoids or anal itching.) 56 g 1   levocetirizine (XYZAL) 5 MG tablet Take 5 mg by mouth every evening.     loperamide (IMODIUM A-D) 2 MG tablet Take 2 mg by mouth 2 (two) times daily as needed for diarrhea or loose stools.     Multiple Vitamin (MULTIVITAMIN) tablet Take 1 tablet by mouth in the morning.     ondansetron (ZOFRAN-ODT) 4 MG disintegrating tablet Take 1 tablet (4 mg total) by mouth every 8 (eight) hours  as needed for nausea or vomiting. 30 tablet 0   pantoprazole (PROTONIX) 40 MG tablet TAKE ONE TABLET BY MOUTH EVERY DAY 90 tablet 2   pioglitazone (ACTOS) 15 MG tablet Take 15 mg by mouth daily.     polyvinyl alcohol (LIQUIFILM TEARS) 1.4 % ophthalmic solution Place 1 drop into both eyes as needed for dry eyes.     predniSONE 5 MG TBEC Take 5 mg by mouth daily with breakfast.     promethazine-dextromethorphan (PROMETHAZINE-DM) 6.25-15 MG/5ML syrup SMARTSIG:1  teaspoon By Mouth Every 4-6 Hours PRN     sitaGLIPtin-metformin (JANUMET) 50-1000 MG tablet Take 1 tablet by mouth 2 (two) times daily with a meal.     No current facility-administered medications for this visit.    Allergies: Allergies  Allergen Reactions   Sulfa Antibiotics Hives   Caffeine Other (See Comments)    Bladder infections   Codeine Nausea And Vomiting    Past Medical History:  Diagnosis Date   Chronic kidney disease    Diabetes mellitus without complication (HCC)    H/O blood clots    Headache    History of hypertension    Hyperlipidemia    Lupus    Mycobacterial disease    Sicca (HCC)    Past Surgical History:  Procedure Laterality Date   ABDOMINAL HYSTERECTOMY     BIOPSY  10/26/2021   Procedure: BIOPSY;  Surgeon: Dolores Frame, MD;  [facility];  Service: Gastroenterology;;   BIOPSY  04/02/2022   Procedure: BIOPSY;  Surgeon: Dolores Frame, MD;  [facility];  Service: Gastroenterology;;   CHOLECYSTECTOMY N/A 03/07/2023   Procedure: LAPAROSCOPIC CHOLECYSTECTOMY;  Surgeon: Lewie Chamber, DO;  Location: [facility];  Service: General;  Laterality: N/A;   COLONOSCOPY  05/26/2015   Dr Teena Dunk, mild diverticulosis in sigmoid colon, biosies taken from [facility] and sigmoid colon-negative   COLONOSCOPY WITH PROPOFOL N/A 04/02/2022   Procedure: COLONOSCOPY WITH PROPOFOL;  Surgeon: Dolores Frame, MD;  [facility];  Service: Gastroenterology;  Laterality: N/A;  815   CYSTOSCOPY WITH RETROGRADE PYELOGRAM, URETEROSCOPY AND STENT PLACEMENT Right 01/30/2023   Procedure: CYSTOSCOPY WITH RETROGRADE PYELOGRAM, URETEROSCOPY AND STENT PLACEMENT;  Surgeon: Malen Gauze, MD;  Location: [facility];  Service: Urology;  Laterality: Right;   ESOPHAGOGASTRODUODENOSCOPY (EGD) WITH PROPOFOL N/A 10/26/2021   Procedure: ESOPHAGOGASTRODUODENOSCOPY (EGD) WITH PROPOFOL;  Surgeon: Dolores Frame, MD;  [facility];  Service: Gastroenterology;  Laterality: N/A;  1045   EXTRACORPOREAL SHOCK WAVE LITHOTRIPSY Right 12/03/2022   Procedure: EXTRACORPOREAL SHOCK WAVE LITHOTRIPSY (ESWL);  Surgeon: Malen Gauze, MD;  Location: [facility];  Service: Urology;  Laterality: Right;   HEMOSTASIS CLIP PLACEMENT  10/26/2021   Procedure: HEMOSTASIS CLIP PLACEMENT;  Surgeon: Dolores Frame, MD;  [facility];  Service: Gastroenterology;;   HOLMIUM LASER APPLICATION Right 01/30/2023   Procedure: HOLMIUM LASER APPLICATION;  Surgeon: Malen Gauze, MD;  Location: [facility];  Service: Urology;  Laterality: Right;   HOT HEMOSTASIS  10/26/2021   Procedure: HOT HEMOSTASIS (ARGON PLASMA COAGULATION/BICAP);  Surgeon: Marguerita Merles, Reuel Boom, MD;  [facility];  Service: Gastroenterology;;   hystorectomy     POLYPECTOMY  10/26/2021   Procedure: POLYPECTOMY;  Surgeon: Dolores Frame, MD;  [facility];  Service: Gastroenterology;;   POLYPECTOMY  04/02/2022   Procedure: POLYPECTOMY;  Surgeon: Dolores Frame, MD;  [facility];  Service: Gastroenterology;;   SINUS SURGERY  WITH INSTATRAK  2015   Family History  Problem Relation Age of Onset   Hypertension Mother    Diabetes Mother    Kidney disease Mother    Heart disease Mother    High Cholesterol Mother    Lung cancer Mother    Hypertension Father    Diabetes Father    Kidney disease Father    Heart disease Father    Social History   Socioeconomic History   Marital status: Divorced    Spouse name: Not on file   Number of children: 1   Years of education: Not on file   Highest education level: Not on file  Occupational History   Not on file  Tobacco Use   Smoking status: Never    Passive exposure: Never   Smokeless tobacco: Never  Vaping Use   Vaping status: Never Used  Substance and Sexual Activity   Alcohol use: No   Drug use: No   Sexual activity: Not Currently     Partners: Male  Other Topics Concern   Not on file  Social History Narrative   Lives home alone.  Widow.    Is Disabled.  One child.  Sister, Chip Boer.   Social Drivers of Corporate investment banker Strain: Low Risk  (11/07/2021)   [facility]   Overall Financial Resource Strain (CARDIA)    Difficulty of Paying Living Expenses: Not hard at all  Food Insecurity: Low Risk  (03/09/2023)   [facility]   Hunger Vital Sign    Worried About Running Out of Food in the Last Year: Never true    Ran Out of Food in the Last Year: Never true  Transportation Needs: Unmet Transportation Needs (03/09/2023)   [facility]   Transportation    In the past 12 months, has lack of reliable transportation kept you from medical appointments, meetings, work or from getting things needed for daily living? : Yes  Physical Activity: Not on file  Stress: Not on file  Social Connections: Not on file  Intimate Partner Violence: Not At Risk (03/06/2023)   Humiliation, Afraid, Rape, and Kick questionnaire    Fear of Current or Ex-Partner: No    Emotionally Abused: No    Physically Abused: No    Sexually Abused: No    SUBJECTIVE  Review of Systems Constitutional: Patient denies any unintentional weight loss or change in strength lntegumentary: Patient denies any rashes or pruritus Cardiovascular: Patient denies chest pain or syncope Respiratory: Patient denies shortness of breath Gastrointestinal: Patient denies nausea, vomiting, constipation, or diarrhea Musculoskeletal: Patient denies muscle cramps or weakness Neurologic: Patient denies convulsions or seizures Allergic/Immunologic: Patient denies recent allergic reaction(s) Hematologic/Lymphatic: Patient denies bleeding tendencies Endocrine: Patient denies heat/cold intolerance  GU: As per HPI.  OBJECTIVE Vitals:   09/30/23 1339   BP: 122/73  Pulse: 73  Temp: 98.1 F (36.7 C)   There is no height or weight on file to calculate BMI.  Physical Examination Constitutional: No obvious distress; patient is non-toxic appearing  Cardiovascular: No visible lower extremity edema.  Respiratory: The patient does not have audible wheezing/stridor; respirations do not appear labored  Gastrointestinal: Abdomen non-distended Musculoskeletal: Normal ROM of UEs  Skin: No obvious rashes/open sores  Neurologic: CN 2-12 grossly intact Psychiatric: Answered questions appropriately with normal affect  Hematologic/Lymphatic/Immunologic: No obvious bruises or sites of spontaneous bleeding  UA: negative  PVR: 1 ml  ASSESSMENT Kidney stones - Plan: Urinalysis, Routine w reflex microscopic, BLADDER SCAN AMB NON-IMAGING, DG Abd 1 View  We reviewed recent imaging results; awaiting radiology results, appears to have no acute findings per provider interpretation.  For stone prevention: Advised adequate hydration and we discussed option to consider low oxalate diet given that calcium oxalate is the most common type of stone. Handout provided about stone prevention diet.  We discussed recent elevation in creatinine, which has improved somewhat over the past few days. No evidence of urologic etiology - suspect that is related to her CKD and Lupus. Advised to continue follow up with her PCP and rheumatologist.  Will plan to follow up in 6 months with KUB for stone surveillance or sooner if needed. Pt verbalized understanding and agreement. All questions were answered.  PLAN Advised the following: Maintain adequate fluid intake daily. Drink citrus juice (lemon, lime or orange juice) routinely. Low oxalate diet. Return in about 6 months (around 03/30/2024) for KUB, UA, & f/u with Evette Georges NP.  Orders Placed This Encounter  Procedures   DG Abd 1 View    Standing Status:   Future    Expected Date:   03/30/2024    Expiration Date:    09/29/2024    Reason for Exam (SYMPTOM  OR DIAGNOSIS REQUIRED):   kidney stone    Preferred imaging location?:   [facility]   Urinalysis, Routine w reflex microscopic   BLADDER SCAN AMB NON-IMAGING    It has been explained that the patient is to follow regularly with their PCP in addition to all other providers involved in their care and to follow instructions provided by these respective offices. Patient advised to contact urology clinic if any urologic-pertaining questions, concerns, new symptoms or problems arise in the interim period.  There are no Patient Instructions on file for this visit.  Electronically signed by:  Donnita Falls, MSN, FNP-C, CUNP 09/30/2023 2:16 PM

## 2023-09-26 LAB — LIPID PANEL
Chol/HDL Ratio: 3.2 {ratio} (ref 0.0–4.4)
Cholesterol, Total: 145 mg/dL (ref 100–199)
HDL: 45 mg/dL (ref >39)
LDL Chol Calc (NIH): 79 mg/dL (ref 0–99)
Triglycerides: 117 mg/dL (ref 0–149)
VLDL Cholesterol Cal: 21 mg/dL (ref 5–40)

## 2023-09-26 LAB — COMPREHENSIVE METABOLIC PANEL
ALT: 12 [IU]/L (ref 0–32)
AST: 24 [IU]/L (ref 0–40)
Albumin: 4.2 g/dL (ref 3.9–4.9)
Alkaline Phosphatase: 72 [IU]/L (ref 44–121)
BUN/Creatinine Ratio: 11 — ABNORMAL LOW (ref 12–28)
BUN: 17 mg/dL (ref 8–27)
Bilirubin Total: 0.4 mg/dL (ref 0.0–1.2)
CO2: 23 mmol/L (ref 20–29)
Calcium: 10.5 mg/dL — ABNORMAL HIGH (ref 8.7–10.3)
Chloride: 106 mmol/L (ref 96–106)
Creatinine, Ser: 1.53 mg/dL — ABNORMAL HIGH (ref 0.57–1.00)
Globulin, Total: 1.8 g/dL (ref 1.5–4.5)
Glucose: 82 mg/dL (ref 70–99)
Potassium: 4.5 mmol/L (ref 3.5–5.2)
Sodium: 142 mmol/L (ref 134–144)
Total Protein: 6 g/dL (ref 6.0–8.5)
eGFR: 37 mL/min/{1.73_m2} — ABNORMAL LOW (ref >59)

## 2023-09-26 LAB — PTH, INTACT AND CALCIUM: PTH: 50 pg/mL (ref 15–65)

## 2023-09-29 ENCOUNTER — Ambulatory Visit (INDEPENDENT_AMBULATORY_CARE_PROVIDER_SITE_OTHER): Payer: Medicare HMO | Admitting: Gastroenterology

## 2023-09-29 VITALS — BP 134/69 | HR 72 | Temp 97.6°F | Ht 65.0 in | Wt 194.9 lb

## 2023-09-29 DIAGNOSIS — K641 Second degree hemorrhoids: Secondary | ICD-10-CM

## 2023-09-29 DIAGNOSIS — K219 Gastro-esophageal reflux disease without esophagitis: Secondary | ICD-10-CM

## 2023-09-29 DIAGNOSIS — K649 Unspecified hemorrhoids: Secondary | ICD-10-CM | POA: Diagnosis not present

## 2023-09-29 NOTE — Patient Instructions (Addendum)
You can continue with preparation H as needed as well as docusate and senna Avoid straining and limit long periods sitting on the toilet Please let me know if you have a recurrence of symptoms Continue protonix 40mg  daily and avoiding trigger foods   Follow up 6 months  It was a pleasure to see you today. I want to create trusting relationships with patients and provide genuine, compassionate, and quality care. I truly value your feedback! please be on the lookout for a survey regarding your visit with me today. I appreciate your input about our visit and your time in completing this!    Waynette Towers L. Jeanmarie Hubert, MSN, APRN, AGNP-C Adult-Gerontology Nurse Practitioner [facility]

## 2023-09-29 NOTE — Progress Notes (Addendum)
Referring Provider: Pollyann Savoy, NP Primary Care Physician:  Pollyann Savoy, NP Primary GI Physician: Dr. Levon Hedger   Chief Complaint  Patient presents with   Hemorrhoids    Follow up on hemorrhoids. States she has not had any bleeding in past couple of months. Still having some pain with hemorrhoids. Using prep H, stool softner and laxatives.    HPI:   Joy Leach is a 67 y.o. 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  Having a fluctuation in her bowel movements with diarrhea and constipation.  Having to strain to have a bowel movement.  Took MiraLAX in the past but felt sore in her abdomen with this.  Endorsed ongoing chronic nausea for the past 2 years.  Using Zofran with good results.  Patient recommended to start Senokot 2 pills daily, docusate 100 mg daily, Preparation H every 12 hours x 1 week.  Could consider hemorrhoid banding though given she is on chronic ACs would utilize this as a last resort  Present: Reports hemorrhoids are better for the past month, she has some mild pain in her rectal area from hemorrhoids at times but no further bleeding. She is using preparation H, a stool softener and a laxative. Having 2-3 stools per day that are soft and easy to pass. She has tried anusol in the past without improvement.   She notes some occasional abdominal pain but states given she is on chronic antibiotics this seems to be the precipitating factor.  Nausea seems some somewhat improved. She is taking zofran just as needed with good results. She is having some heartburn at times, usually depends on what she eats but she is trying to avoid certain foods  like acidic stuff such as tomatoes, citrus foods. Does pretty good if she avoids trigger foods. No dysphagia or odynophagia.   Notable history: admitted to the hospital in late May 2024 she was admitted to [facility] after presenting worsening abdominal pain which was concerning for cholelithiasis and cholecystitis.  She underwent cholecystectomy, no intraoperative cholangiogram could be performed.  Due to this MRCP was performed which showed a tiny calculus at the ampulla.  Given presence of abnormalities on HIDA scan concerning for CBD obstruction, the patient had to be transferred to [facility] for endoscopic retrograde cholangiopancreatography.  This was performed on 03/11/2023 by Dr. Aviva Kluver.  CBD measuring 11 mm and there was no presence of filling defects in the bile duct, with normal intrahepatic ducts.  The biliary tree was swept with a 12 mm balloon and some sludge was removed.   following with [facility] infectious disease department for chronic mycobacterial infection.  She has been on azithromycin and renally dosed ethambutol and linezolid for this. Patient states she possibly is cured from this now.   Last Colonoscopy:04/02/22 - Three 3 to 5 mm polyps in the ascending colon and in the cecum - One 2 mm polyp in the ascending colon - Two 4 to 5 mm polyps in the transverse colon  - Diverticulosis in the sigmoid colon. Biopsied. - Congested mucosa in the rectum. normal colon Biopsied. - Non-bleeding external and internal hemorrhoids. - Hemorrhoids found on perianal exam. (6 TAs, normal  colonic biopsies) Last Endoscopy:10/26/21 normal esophagus  - Multiple gastric polyps. Fundic gland polyps - rest of the examined stomach was normal. Biopsied-normal - Normal duodenum. Biopsied-normal     Repeat Colonoscopy 3 years    Past Medical History:  Diagnosis Date   Chronic kidney disease    Diabetes mellitus without complication (HCC)    H/O blood clots    Headache    History of  hypertension    Hyperlipidemia    Lupus (HCC)    Mycobacterial disease    Sicca (HCC)     Past Surgical History:  Procedure Laterality Date   ABDOMINAL HYSTERECTOMY     BIOPSY  10/26/2021   Procedure: BIOPSY;  Surgeon: Dolores Frame, MD;  [facility];  Service: Gastroenterology;;   BIOPSY  04/02/2022   Procedure: BIOPSY;  Surgeon: Dolores Frame, MD;  [facility];  Service: Gastroenterology;;   CHOLECYSTECTOMY N/A 03/07/2023   Procedure: LAPAROSCOPIC CHOLECYSTECTOMY;  Surgeon: Lewie Chamber, DO;  Location: [facility];  Service: General;  Laterality: N/A;   COLONOSCOPY  05/26/2015   Dr Teena Dunk, mild diverticulosis in sigmoid colon, biosies taken from [facility] and sigmoid colon-negative   COLONOSCOPY WITH PROPOFOL N/A 04/02/2022   Procedure: COLONOSCOPY WITH PROPOFOL;  Surgeon: Dolores Frame, MD;  [facility];  Service: Gastroenterology;  Laterality: N/A;  815   CYSTOSCOPY WITH RETROGRADE PYELOGRAM, URETEROSCOPY AND STENT PLACEMENT Right 01/30/2023   Procedure: CYSTOSCOPY WITH RETROGRADE PYELOGRAM, URETEROSCOPY AND STENT PLACEMENT;  Surgeon: Malen Gauze, MD;  Location: [facility];  Service: Urology;  Laterality: Right;   ESOPHAGOGASTRODUODENOSCOPY (EGD) WITH PROPOFOL N/A 10/26/2021   Procedure: ESOPHAGOGASTRODUODENOSCOPY (EGD) WITH PROPOFOL;  Surgeon: Dolores Frame, MD;  [facility];  Service: Gastroenterology;  Laterality: N/A;  1045   EXTRACORPOREAL SHOCK WAVE LITHOTRIPSY Right 12/03/2022   Procedure: EXTRACORPOREAL SHOCK WAVE LITHOTRIPSY (ESWL);  Surgeon: Malen Gauze, MD;  Location: [facility];  Service: Urology;  Laterality: Right;   HEMOSTASIS CLIP PLACEMENT  10/26/2021   Procedure: HEMOSTASIS CLIP PLACEMENT;  Surgeon: Dolores Frame, MD;  [facility];  Service: Gastroenterology;;   HOLMIUM LASER APPLICATION Right 01/30/2023   Procedure: HOLMIUM LASER APPLICATION;   Surgeon: Malen Gauze, MD;  Location: [facility];  Service: Urology;  Laterality: Right;   HOT HEMOSTASIS  10/26/2021   Procedure: HOT HEMOSTASIS (ARGON PLASMA COAGULATION/BICAP);  Surgeon: Marguerita Merles, Reuel Boom, MD;  [facility];  Service: Gastroenterology;;   hystorectomy     POLYPECTOMY  10/26/2021   Procedure: POLYPECTOMY;  Surgeon: Dolores Frame, MD;  [facility];  Service: Gastroenterology;;   POLYPECTOMY  04/02/2022   Procedure: POLYPECTOMY;  Surgeon: Marguerita Merles, Reuel Boom, MD;  [facility];  Service: Gastroenterology;;   SINUS SURGERY WITH INSTATRAK  2015    Current Outpatient Medications  Medication Sig Dispense Refill   acetaminophen (TYLENOL) 500 MG tablet Take 2 tablets (1,000 mg total) by mouth every 8 (eight) hours as needed for mild pain, fever or headache.     albuterol (VENTOLIN HFA) 108 (90 Base) MCG/ACT inhaler Inhale 1 puff into the lungs every 6 (six) hours as needed for shortness of breath.     amoxicillin-clavulanate (AUGMENTIN) 875-125 MG tablet Take 1 tablet by mouth 2 (two) times daily.     apixaban (ELIQUIS) 5 MG TABS tablet Take 1 tablet (5 mg total) by mouth 2 (two) times daily. To be resume at the moment of discharge and after  all procedures completed.     azelastine (ASTELIN) 0.1 % nasal spray Place 1 spray into both nostrils daily.     ergocalciferol (VITAMIN D2) 1.25 MG (50000 UT) capsule Take 50,000 Units by mouth every 14 (fourteen) days.     ezetimibe (ZETIA) 10 MG tablet Take 10 mg by mouth daily.     fenofibrate (TRICOR) 145 MG tablet Take 145 mg by mouth every evening.     ferrous sulfate 325 (65 FE) MG EC tablet Take 1 tablet (325 mg total) by mouth daily with breakfast. Resume at time of discharge     furosemide (LASIX) 20 MG tablet TAKE (1) TABLET BY MOUTH ONCE DAILY AS NEEDED. (Patient taking differently: Take 5 mg by mouth daily as needed (leg swelling). Half of 10 mg) 30 tablet 0    hydrocortisone (ANUSOL-HC) 2.5 % rectal cream Place 1 Application rectally 4 (four) times daily. Use four times per day x10 days then PRN thereafter (Patient taking differently: Place 1 Application rectally daily as needed for hemorrhoids or anal itching.) 56 g 1   levocetirizine (XYZAL) 5 MG tablet Take 5 mg by mouth every evening.     loperamide (IMODIUM A-D) 2 MG tablet Take 2 mg by mouth 2 (two) times daily as needed for diarrhea or loose stools.     Multiple Vitamin (MULTIVITAMIN) tablet Take 1 tablet by mouth in the morning.     ondansetron (ZOFRAN-ODT) 4 MG disintegrating tablet Take 1 tablet (4 mg total) by mouth every 8 (eight) hours as needed for nausea or vomiting. 30 tablet 0   pantoprazole (PROTONIX) 40 MG tablet TAKE ONE TABLET BY MOUTH EVERY DAY 90 tablet 2   pioglitazone (ACTOS) 15 MG tablet Take 15 mg by mouth daily.     polyvinyl alcohol (LIQUIFILM TEARS) 1.4 % ophthalmic solution Place 1 drop into both eyes as needed for dry eyes.     predniSONE 5 MG TBEC Take 5 mg by mouth daily with breakfast.     promethazine-dextromethorphan (PROMETHAZINE-DM) 6.25-15 MG/5ML syrup SMARTSIG:1 teaspoon By Mouth Every 4-6 Hours PRN     sitaGLIPtin-metformin (JANUMET) 50-1000 MG tablet Take 1 tablet by mouth 2 (two) times daily with a meal.     phenylephrine (,USE FOR PREPARATION-H,) 0.25 % suppository Place 1 suppository rectally 2 (two) times daily. (Patient not taking: Reported on 09/29/2023) 12 suppository 0   pregabalin (LYRICA) 75 MG capsule Take 75 mg by mouth every 8 (eight) hours. (Patient not taking: Reported on 09/29/2023)     No current facility-administered medications for this visit.    Allergies as of 09/29/2023 - Review Complete 09/29/2023  Allergen Reaction Noted   Sulfa antibiotics Hives 06/13/2016   Caffeine Other (See Comments) 05/01/2021   Codeine Nausea And Vomiting 06/13/2016    Family History  Problem Relation Age of Onset   Hypertension Mother    Diabetes Mother     Kidney disease Mother    Heart disease Mother    High Cholesterol Mother    Lung cancer Mother    Hypertension Father    Diabetes Father    Kidney disease Father    Heart disease Father     Social History   Socioeconomic History   Marital status: Divorced    Spouse name: Not on file   Number of children: 1   Years of education: Not on file   Highest education level: Not on file  Occupational History   Not on file  Tobacco Use   Smoking status:  Never    Passive exposure: Never   Smokeless tobacco: Never  Vaping Use   Vaping status: Never Used  Substance and Sexual Activity   Alcohol use: No   Drug use: No   Sexual activity: Not Currently    Partners: Male  Other Topics Concern   Not on file  Social History Narrative   Lives home alone.  Widow.    Is Disabled.  One child.  Sister, Chip Boer.   Social Drivers of Corporate investment banker Strain: Low Risk  (11/07/2021)   [facility]   Overall Financial Resource Strain (CARDIA)    Difficulty of Paying Living Expenses: Not hard at all  Food Insecurity: Low Risk  (03/09/2023)   [facility]   Hunger Vital Sign    Worried About Running Out of Food in the Last Year: Never true    Ran Out of Food in the Last Year: Never true  Transportation Needs: Unmet Transportation Needs (03/09/2023)   [facility]   Transportation    In the past 12 months, has lack of reliable transportation kept you from medical appointments, meetings, work or from getting things needed for daily living? : Yes  Physical Activity: Not on file  Stress: Not on file  Social Connections: Not on file    Review of systems General: negative for malaise, night sweats, fever, chills, weight loss Neck: Negative for lumps, goiter, pain and significant neck swelling Resp: Negative for cough, wheezing, dyspnea at rest CV: Negative for chest  pain, leg swelling, palpitations, orthopnea GI: denies melena, hematochezia, nausea, vomiting, diarrhea, constipation, dysphagia, odyonophagia, early satiety or unintentional weight loss. +hemorrhoids/rectal pain  MSK: Negative for joint pain or swelling, back pain, and muscle pain. Derm: Negative for itching or rash Psych: Denies depression, anxiety, memory loss, confusion. No homicidal or suicidal ideation.  Heme: Negative for prolonged bleeding, bruising easily, and swollen nodes. Endocrine: Negative for cold or heat intolerance, polyuria, polydipsia and goiter. Neuro: negative for tremor, gait imbalance, syncope and seizures. The remainder of the review of systems is noncontributory.  Physical Exam: BP 134/69 (BP Location: Left Arm, Patient Position: Sitting, Cuff Size: Normal)   Pulse 72   Temp 97.6 F (36.4 C) (Oral)   Ht 5\' 5"  (1.651 m)   Wt 194 lb 14.4 oz (88.4 kg)   BMI 32.43 kg/m  General:   Alert and oriented. No distress noted. Pleasant and cooperative.  Head:  Normocephalic and atraumatic. Eyes:  Conjuctiva clear without scleral icterus. Mouth:  Oral mucosa pink and moist. Good dentition. No lesions. Heart: Normal rate and rhythm, s1 and s2 heart sounds present.  Lungs: Clear lung sounds in all lobes. Respirations equal and unlabored. Abdomen:  +BS, soft, non-tender and non-distended. No rebound or guarding. No HSM or masses noted. Derm: No palmar erythema or jaundice Msk:  Symmetrical without gross deformities. Normal posture. Extremities:  Without edema. Neurologic:  Alert and  oriented x4 Psych:  Alert and cooperative. Normal mood and affect.  Invalid input(s): "6 MONTHS"   ASSESSMENT: Joy Leach is a 67 y.o. female presenting today for follow-up of of hemorrhoids/rectal bleeding and GERD  Rectal bleeding has resolved, hemorrhoids have improved though still with some discomfort at times.  She notes that Preparation H seems to work better than Anusol cream for  her.  Having 2-3 soft, easy to pass stools per day on stool softener and laxative.  At this time she like to continue current regimen.  As she is on chronic ACs, would only utilize hemorrhoid banding as last resort, could consider count apothecary hemorrhoid compound cream if symptoms are refractory to Preparation H.  She continue to avoid straining and limit toilet time.  GERD mostly well-managed on Protonix 40 mg daily unless she eat something citrusy or tomato-based which she is trying to avoid.  Denies any dysphagia or odynophagia.  Will continue with current regimen and good reflux precautions.  PLAN:  Continue with stool softner/laxative  2. Continue with preparation H  3. Continue protonix 40mg  daily  4. Avoid trigger foods  5. Limit toilet time, avoid straining  6. Consider CA hemorrhoid cream if refractory to prep h 7. Hemorrhoid banding to be considered a last resort given chronic ACs   All questions were answered, patient verbalized understanding and is in agreement with plan as outlined above.   Follow Up: 6 months   Chantalle Defilippo L. Jeanmarie Hubert, MSN, APRN, AGNP-C Adult-Gerontology Nurse Practitioner [facility]   I have reviewed the note and agree with the APP's assessment as described in this progress note  [name], MD Gastroenterology and Hepatology [facility]

## 2023-09-30 ENCOUNTER — Ambulatory Visit: Payer: Medicare HMO | Admitting: Urology

## 2023-09-30 ENCOUNTER — Encounter: Payer: Self-pay | Admitting: Urology

## 2023-09-30 ENCOUNTER — Ambulatory Visit (HOSPITAL_COMMUNITY)
Admission: RE | Admit: 2023-09-30 | Discharge: 2023-09-30 | Disposition: A | Discharge status: Home / Self Care | Payer: Medicare HMO | Source: Ambulatory Visit | Attending: Urology | Admitting: Urology

## 2023-09-30 VITALS — BP 122/73 | HR 73 | Temp 98.1°F

## 2023-09-30 DIAGNOSIS — Z87442 Personal history of urinary calculi: Secondary | ICD-10-CM | POA: Diagnosis not present

## 2023-09-30 DIAGNOSIS — N200 Calculus of kidney: Secondary | ICD-10-CM

## 2023-09-30 DIAGNOSIS — Z09 Encounter for follow-up examination after completed treatment for conditions other than malignant neoplasm: Secondary | ICD-10-CM | POA: Diagnosis not present

## 2023-09-30 LAB — BLADDER SCAN AMB NON-IMAGING: Scan Result: 1

## 2023-10-01 LAB — URINALYSIS, ROUTINE W REFLEX MICROSCOPIC
Bilirubin, UA: NEGATIVE
Glucose, UA: NEGATIVE
Ketones, UA: NEGATIVE
Leukocytes,UA: NEGATIVE
Nitrite, UA: NEGATIVE
Protein,UA: NEGATIVE
RBC, UA: NEGATIVE
Specific Gravity, UA: 1.03 (ref 1.005–1.030)
Urobilinogen, Ur: 0.2 mg/dL (ref 0.2–1.0)
pH, UA: 6 (ref 5.0–7.5)

## 2023-10-02 ENCOUNTER — Ambulatory Visit: Payer: Medicare HMO | Admitting: "Endocrinology

## 2023-10-02 ENCOUNTER — Encounter: Payer: Self-pay | Admitting: "Endocrinology

## 2023-10-02 VITALS — BP 116/70 | HR 68 | Ht 65.0 in | Wt 195.2 lb

## 2023-10-02 DIAGNOSIS — M8580 Other specified disorders of bone density and structure, unspecified site: Secondary | ICD-10-CM

## 2023-10-02 DIAGNOSIS — E782 Mixed hyperlipidemia: Secondary | ICD-10-CM

## 2023-10-02 DIAGNOSIS — E212 Other hyperparathyroidism: Secondary | ICD-10-CM | POA: Diagnosis not present

## 2023-10-02 NOTE — Progress Notes (Signed)
10/02/2023, 1:08 PM  Endocrinology follow-up note  Joy Leach is a 67 y.o.-year-old female, referred by her  Chapell, Waldron Session, NP  , for evaluation for hypercalcemia/hyperparathyroidism.   Past Medical History:  Diagnosis Date   Chronic kidney disease    Diabetes mellitus without complication (HCC)    H/O blood clots    Headache    History of hypertension    Hyperlipidemia    Lupus    Mycobacterial disease    Sicca (HCC)     Past Surgical History:  Procedure Laterality Date   ABDOMINAL HYSTERECTOMY     BIOPSY  10/26/2021   Procedure: BIOPSY;  Surgeon: Dolores Frame, MD;  [facility];  Service: Gastroenterology;;   BIOPSY  04/02/2022   Procedure: BIOPSY;  Surgeon: Dolores Frame, MD;  [facility]: [facility];  Service: Gastroenterology;;   CHOLECYSTECTOMY N/A 03/07/2023   Procedure: LAPAROSCOPIC CHOLECYSTECTOMY;  Surgeon: Lewie Chamber, DO;  Location: [facility];  Service: General;  Laterality: N/A;   COLONOSCOPY  05/26/2015   Dr Teena Dunk, mild diverticulosis in sigmoid colon, biosies taken from [facility] and sigmoid colon-negative   COLONOSCOPY WITH PROPOFOL N/A 04/02/2022   Procedure: COLONOSCOPY WITH PROPOFOL;  Surgeon: Dolores Frame, MD;  [facility];  Service: Gastroenterology;  Laterality: N/A;  815   CYSTOSCOPY WITH RETROGRADE PYELOGRAM, URETEROSCOPY AND STENT PLACEMENT Right 01/30/2023   Procedure: CYSTOSCOPY WITH RETROGRADE PYELOGRAM, URETEROSCOPY AND STENT PLACEMENT;  Surgeon: Malen Gauze, MD;  Location: [facility];  Service: Urology;  Laterality: Right;   ESOPHAGOGASTRODUODENOSCOPY (EGD) WITH PROPOFOL N/A 10/26/2021   Procedure: ESOPHAGOGASTRODUODENOSCOPY (EGD) WITH PROPOFOL;  Surgeon: Dolores Frame, MD;  [facility];  Service: Gastroenterology;  Laterality: N/A;  1045   EXTRACORPOREAL SHOCK WAVE LITHOTRIPSY Right 12/03/2022    Procedure: EXTRACORPOREAL SHOCK WAVE LITHOTRIPSY (ESWL);  Surgeon: Malen Gauze, MD;  Location: [facility];  Service: Urology;  Laterality: Right;   HEMOSTASIS CLIP PLACEMENT  10/26/2021   Procedure: HEMOSTASIS CLIP PLACEMENT;  Surgeon: Dolores Frame, MD;  [facility];  Service: Gastroenterology;;   HOLMIUM LASER APPLICATION Right 01/30/2023   Procedure: HOLMIUM LASER APPLICATION;  Surgeon: Malen Gauze, MD;  Location: [facility];  Service: Urology;  Laterality: Right;   HOT HEMOSTASIS  10/26/2021   Procedure: HOT HEMOSTASIS (ARGON PLASMA COAGULATION/BICAP);  Surgeon: Marguerita Merles, Reuel Boom, MD;  [facility];  Service: Gastroenterology;;   hystorectomy     POLYPECTOMY  10/26/2021   Procedure: POLYPECTOMY;  Surgeon: Dolores Frame, MD;  [facility];  Service: Gastroenterology;;   POLYPECTOMY  04/02/2022   Procedure: POLYPECTOMY;  Surgeon: Dolores Frame, MD;  [facility];  Service: Gastroenterology;;   SINUS SURGERY WITH INSTATRAK  2015    Social History   Tobacco Use   Smoking status: Never    Passive exposure: Never   Smokeless tobacco: Never  Vaping Use   Vaping status: Never Used  Substance Use Topics   Alcohol use: No   Drug use: No    Family History  Problem Relation Age of Onset   Hypertension Mother    Diabetes  Mother    Kidney disease Mother    Heart disease Mother    High Cholesterol Mother    Lung cancer Mother    Hypertension Father    Diabetes Father    Kidney disease Father    Heart disease Father     Outpatient Encounter Medications as of 10/02/2023  Medication Sig   acetaminophen (TYLENOL) 500 MG tablet Take 2 tablets (1,000 mg total) by mouth every 8 (eight) hours as needed for mild pain, fever or headache.   albuterol (VENTOLIN HFA) 108 (90 Base) MCG/ACT inhaler Inhale 1 puff into the lungs every 6 (six) hours as needed for shortness of breath.    amoxicillin-clavulanate (AUGMENTIN) 875-125 MG tablet Take 1 tablet by mouth 2 (two) times daily.   apixaban (ELIQUIS) 5 MG TABS tablet Take 1 tablet (5 mg total) by mouth 2 (two) times daily. To be resume at the moment of discharge and after all procedures completed.   azelastine (ASTELIN) 0.1 % nasal spray Place 1 spray into both nostrils daily.   ezetimibe (ZETIA) 10 MG tablet Take 10 mg by mouth daily.   fenofibrate (TRICOR) 145 MG tablet Take 145 mg by mouth every evening.   ferrous sulfate 325 (65 FE) MG EC tablet Take 1 tablet (325 mg total) by mouth daily with breakfast. Resume at time of discharge   furosemide (LASIX) 20 MG tablet TAKE (1) TABLET BY MOUTH ONCE DAILY AS NEEDED. (Patient taking differently: Take 5 mg by mouth daily as needed (leg swelling). Half of 10 mg)   hydrocortisone (ANUSOL-HC) 2.5 % rectal cream Place 1 Application rectally 4 (four) times daily. Use four times per day x10 days then PRN thereafter (Patient taking differently: Place 1 Application rectally daily as needed for hemorrhoids or anal itching.)   levocetirizine (XYZAL) 5 MG tablet Take 5 mg by mouth every evening.   loperamide (IMODIUM A-D) 2 MG tablet Take 2 mg by mouth 2 (two) times daily as needed for diarrhea or loose stools.   Multiple Vitamin (MULTIVITAMIN) tablet Take 1 tablet by mouth in the morning.   ondansetron (ZOFRAN-ODT) 4 MG disintegrating tablet Take 1 tablet (4 mg total) by mouth every 8 (eight) hours as needed for nausea or vomiting.   pantoprazole (PROTONIX) 40 MG tablet TAKE ONE TABLET BY MOUTH EVERY DAY   pioglitazone (ACTOS) 15 MG tablet Take 15 mg by mouth daily.   polyvinyl alcohol (LIQUIFILM TEARS) 1.4 % ophthalmic solution Place 1 drop into both eyes as needed for dry eyes.   predniSONE 5 MG TBEC Take 5 mg by mouth daily with breakfast.   promethazine-dextromethorphan (PROMETHAZINE-DM) 6.25-15 MG/5ML syrup SMARTSIG:1 teaspoon By Mouth Every 4-6 Hours PRN   sitaGLIPtin-metformin  (JANUMET) 50-1000 MG tablet Take 1 tablet by mouth 2 (two) times daily with a meal.   [DISCONTINUED] ergocalciferol (VITAMIN D2) 1.25 MG (50000 UT) capsule Take 50,000 Units by mouth every 14 (fourteen) days.   No facility-administered encounter medications on file as of 10/02/2023.    Allergies  Allergen Reactions   Sulfa Antibiotics Hives   Caffeine Other (See Comments)    Bladder infections   Codeine Nausea And Vomiting     HPI  Heyab Gauntt was diagnosed with elevated PTH of 88 on December 06, 2022 associated with mild hypercalcemia.   She is returning to follow-up with repeat labs after she was seen for hypercalcemia.  Her most recent labs show calcium of 10.5 and PTH of 50 on September 25, 2023.   She has  a medical history of lupus with nephropathy on regular nephrology follow-up. Her recent measurements of calcium were higher than target, maximum being 10.8 on September 22, 2023. Elevated PTH and mild hypercalcemia on background of CKD, she was put on expectant management.  She has no new complaints today.   Patient has no previously known history of parathyroid, pituitary, adrenal dysfunctions; no family history of such dysfunctions. She did have a recent bone density at her rheumatologist.  T-score of the hips were -2.0 consistent with osteopenia.  She is accompanied by her sister to clinic.    She is on a large dose of vitamin D2 50,000 units every 14 days, with her previsit 25-hydroxy vitamin D level of 83.  No prior history of fragility fractures or falls.  She is unsure of her remote past history of nephrolithiasis. - She does not report any height loss. She does not control bladder, wears pads and reporting urge incontinence.   she is not on HCTZ or other thiazide therapy.   She has type 2 diabetes on Actos, Janumet.  She also has hyperlipidemia on Tricor. she is not on calcium supplements,  she eats dairy and green, leafy, vegetables on average amounts.  she does not  have a family history of hypercalcemia, pituitary tumors, thyroid cancer, or osteoporosis.    ROS:  Constitutional: + Minimally fluctuating body weight,  no fatigue, no subjective hyperthermia, no subjective hypothermia Eyes: no blurry vision, no xerophthalmia   PE: BP 116/70   Pulse 68   Ht 5\' 5"  (1.651 m)   Wt 195 lb 3.2 oz (88.5 kg)   BMI 32.48 kg/m , Body mass index is 32.48 kg/m. Wt Readings from Last 3 Encounters:  10/02/23 195 lb 3.2 oz (88.5 kg)  09/29/23 194 lb 14.4 oz (88.4 kg)  06/30/23 189 lb 9.6 oz (86 kg)    Constitutional: + BMI of 30.19, not in acute distress, normal state of mind Eyes: PERRLA, EOMI, no exophthalmos ENT: moist mucous membranes, no gross thyromegaly, no gross cervical lymphadenopathy    CMP ( most recent) CMP     Component Value Date/Time   NA 142 09/25/2023 1100   K 4.5 09/25/2023 1100   CL 106 09/25/2023 1100   CO2 23 09/25/2023 1100   GLUCOSE 82 09/25/2023 1100   GLUCOSE 132 (H) 03/09/2023 0400   BUN 17 09/25/2023 1100   CREATININE 1.53 (H) 09/25/2023 1100   CREATININE 1.06 (H) 08/20/2016 1422   CALCIUM 10.5 (H) 09/25/2023 1100   PROT 6.0 09/25/2023 1100   ALBUMIN 4.2 09/25/2023 1100   AST 24 09/25/2023 1100   ALT 12 09/25/2023 1100   ALKPHOS 72 09/25/2023 1100   BILITOT 0.4 09/25/2023 1100   GFRNONAA 57 (L) 03/09/2023 0400   GFRAA 73 03/11/2018 1034      Lab Results  Component Value Date   TSH 1.13 07/12/2016    Recent Results (from the past 2160 hours)  VITAMIN D 25 Hydroxy (Vit-D Deficiency, Fractures)     Status: None   Collection Time: 09/22/23 12:00 AM  Result Value Ref Range   Vit D, 25-Hydroxy 83.5   Basic metabolic panel     Status: Abnormal   Collection Time: 09/22/23 12:00 AM  Result Value Ref Range   BUN 20 4 - 21   CO2 25 (A) 13 - 22   Creatinine 1.6 (A) 0.5 - 1.1   Potassium 4.1 3.5 - 5.1 mEq/L   Sodium 144 137 - 147   Chloride 106  99 - 108  Comprehensive metabolic panel     Status: Abnormal    Collection Time: 09/22/23 12:00 AM  Result Value Ref Range   Globulin 1.8    Calcium 10.8 (A) 8.7 - 10.7   Albumin 4.1 3.5 - 5.0  Lipid panel     Status: None   Collection Time: 09/22/23 12:00 AM  Result Value Ref Range   Triglycerides 141 40 - 160   Cholesterol 139 0 - 200   HDL 44 35 - 70   LDL Cholesterol 70   Hepatic function panel     Status: None   Collection Time: 09/22/23 12:00 AM  Result Value Ref Range   Alkaline Phosphatase 72 25 - 125   ALT 17 7 - 35 U/L   AST 25 13 - 35   Bilirubin, Total 0.4   PTH, intact and calcium     Status: None   Collection Time: 09/25/23 11:00 AM  Result Value Ref Range   PTH 50 15 - 65 pg/mL   PTH Interp Comment     Comment: Interpretation                 Intact PTH    Calcium                                 (pg/mL)      (mg/dL) Normal                          15 - 65     8.6 - 10.2 Primary Hyperparathyroidism         >65          >10.2 Secondary Hyperparathyroidism       >65          <10.2 Non-Parathyroid Hypercalcemia       <65          >10.2 Hypoparathyroidism                  <15          < 8.6 Non-Parathyroid Hypocalcemia    15 - 65          < 8.6   Comprehensive metabolic panel     Status: Abnormal   Collection Time: 09/25/23 11:00 AM  Result Value Ref Range   Glucose 82 70 - 99 mg/dL   BUN 17 8 - 27 mg/dL   Creatinine, Ser 3.24 (H) 0.57 - 1.00 mg/dL   eGFR 37 (L) >40 NU/UVO/5.36   BUN/Creatinine Ratio 11 (L) 12 - 28   Sodium 142 134 - 144 mmol/L   Potassium 4.5 3.5 - 5.2 mmol/L   Chloride 106 96 - 106 mmol/L   CO2 23 20 - 29 mmol/L   Calcium 10.5 (H) 8.7 - 10.3 mg/dL   Total Protein 6.0 6.0 - 8.5 g/dL   Albumin 4.2 3.9 - 4.9 g/dL   Globulin, Total 1.8 1.5 - 4.5 g/dL   Bilirubin Total 0.4 0.0 - 1.2 mg/dL   Alkaline Phosphatase 72 44 - 121 IU/L   AST 24 0 - 40 IU/L   ALT 12 0 - 32 IU/L  Lipid panel     Status: None   Collection Time: 09/25/23 11:00 AM  Result Value Ref Range   Cholesterol, Total 145 100 - 199 mg/dL    Triglycerides 644 0 - 149 mg/dL   HDL  45 >39 mg/dL   VLDL Cholesterol Cal 21 5 - 40 mg/dL   LDL Chol Calc (NIH) 79 0 - 99 mg/dL   Chol/HDL Ratio 3.2 0.0 - 4.4 ratio    Comment:                                   T. Chol/HDL Ratio                                             Men  Women                               1/2 Avg.Risk  3.4    3.3                                   Avg.Risk  5.0    4.4                                2X Avg.Risk  9.6    7.1                                3X Avg.Risk 23.4   11.0   Urinalysis, Routine w reflex microscopic     Status: None   Collection Time: 09/30/23  1:33 PM  Result Value Ref Range   Specific Gravity, UA 1.030 1.005 - 1.030   pH, UA 6.0 5.0 - 7.5   Color, UA Yellow Yellow   Appearance Ur Clear Clear   Leukocytes,UA Negative Negative   Protein,UA Negative Negative/Trace   Glucose, UA Negative Negative   Ketones, UA Negative Negative   RBC, UA Negative Negative   Bilirubin, UA Negative Negative   Urobilinogen, Ur 0.2 0.2 - 1.0 mg/dL   Nitrite, UA Negative Negative   Microscopic Examination Comment     Comment: Microscopic not indicated and not performed.  BLADDER SCAN AMB NON-IMAGING     Status: None   Collection Time: 09/30/23  1:40 PM  Result Value Ref Range   Scan Result 1       Assessment: 1.  Elevated PTH with mild hypercalcemia  on the background of CKD  2.  Hypercalcemia of 10.5  Plan: Patient continues to have mild hypercalcemia with a lower PTH of 50 than previously when it was 88.  PTH RP was undetectable in May 2024  -She has osteopenia, and remote past history of nephrolithiasis. -Denies history of fragility fractures. No abdominal pain, no major mood disorders, no bone pain.  - I discussed with the patient and her sister about the physiology of calcium and parathyroid hormone, and possible  effects of  increased PTH/ Calcium , including kidney stones, cardiac dysrhythmias, osteoporosis, abdominal pain, etc.   -It was not  practical in her case to do 24-hour urine calcium measurement to rule out FHH.  She will not need intervention by surgery or medications at this time.   The cause of her mild hypercalcemia associated with mild elevation of PTH is not set yet.  It is likely between tertiary hyperparathyroidism or  FHH. She will be put on expectant management until next measurement of PTH/calcium and office visit in 6 months. She is advised to discontinue the high-dose vitamin D supplement at this time.  She will have repeat labs before her next visit, and if necessary she will be resumed on low-dose maintenance vitamin D3. Her DEXA scan was reviewed showing osteopenia of hips. She is advised to continue Zetia 10 mg p.o. daily for hyperlipidemia, Janumet 50/1000 mg p.o. twice daily for type 2 diabetes.  She is advised to maintain close follow-up with her PMD.    I spent  26  minutes in the care of the patient today including review of labs from Thyroid Function, CMP, and other relevant labs ; imaging/biopsy records (current and previous including abstractions from other facilities); face-to-face time discussing  her lab results and symptoms, medications doses, her options of short and long term treatment based on the latest standards of care / guidelines;   and documenting the encounter.  [name]  participated in the discussions, expressed understanding, and voiced agreement with the above plans.  All questions were answered to her satisfaction. she is encouraged to contact clinic should she have any questions or concerns prior to her return visit.   - Return in about 6 months (around 04/01/2024) for F/U with Pre-visit Labs.   Marquis Lunch, MD [facility] [address] [place], [place] [address] Phone: [phone]  Fax: [phone]    This note was partially dictated with voice recognition software. Similar sounding words can be transcribed  inadequately or may not  be corrected upon review.  10/02/2023, 1:08 PM

## 2023-10-15 DIAGNOSIS — C44310 Basal cell carcinoma of skin of unspecified parts of face: Secondary | ICD-10-CM

## 2023-10-15 HISTORY — DX: Basal cell carcinoma of skin of unspecified parts of face: C44.310

## 2023-11-29 ENCOUNTER — Emergency Department (HOSPITAL_COMMUNITY): Payer: Medicare Other

## 2023-11-29 ENCOUNTER — Emergency Department (HOSPITAL_COMMUNITY)
Admission: EM | Admit: 2023-11-29 | Discharge: 2023-11-29 | Disposition: A | Discharge status: Home / Self Care | Payer: Medicare Other | Attending: Emergency Medicine | Admitting: Emergency Medicine

## 2023-11-29 ENCOUNTER — Telehealth (HOSPITAL_COMMUNITY): Payer: Self-pay

## 2023-11-29 ENCOUNTER — Encounter (HOSPITAL_COMMUNITY): Payer: Self-pay | Admitting: *Deleted

## 2023-11-29 ENCOUNTER — Other Ambulatory Visit: Payer: Self-pay

## 2023-11-29 DIAGNOSIS — M25572 Pain in left ankle and joints of left foot: Secondary | ICD-10-CM | POA: Insufficient documentation

## 2023-11-29 DIAGNOSIS — R109 Unspecified abdominal pain: Secondary | ICD-10-CM | POA: Diagnosis not present

## 2023-11-29 DIAGNOSIS — Z122 Encounter for screening for malignant neoplasm of respiratory organs: Secondary | ICD-10-CM | POA: Diagnosis not present

## 2023-11-29 DIAGNOSIS — Y92410 Unspecified street and highway as the place of occurrence of the external cause: Secondary | ICD-10-CM | POA: Insufficient documentation

## 2023-11-29 DIAGNOSIS — R0789 Other chest pain: Secondary | ICD-10-CM | POA: Diagnosis not present

## 2023-11-29 DIAGNOSIS — Z7901 Long term (current) use of anticoagulants: Secondary | ICD-10-CM | POA: Insufficient documentation

## 2023-11-29 DIAGNOSIS — M25562 Pain in left knee: Secondary | ICD-10-CM | POA: Diagnosis present

## 2023-11-29 DIAGNOSIS — M25571 Pain in right ankle and joints of right foot: Secondary | ICD-10-CM | POA: Diagnosis not present

## 2023-11-29 DIAGNOSIS — S0990XA Unspecified injury of head, initial encounter: Secondary | ICD-10-CM | POA: Diagnosis present

## 2023-11-29 LAB — CBC WITH DIFFERENTIAL/PLATELET
Abs Immature Granulocytes: 0.07 10*3/uL (ref 0.00–0.07)
Basophils Absolute: 0 10*3/uL (ref 0.0–0.1)
Basophils Relative: 0 %
Eosinophils Absolute: 0.1 10*3/uL (ref 0.0–0.5)
Eosinophils Relative: 1 %
HCT: 40 % (ref 36.0–46.0)
Hemoglobin: 12.5 g/dL (ref 12.0–15.0)
Immature Granulocytes: 1 %
Lymphocytes Relative: 9 %
Lymphs Abs: 0.8 10*3/uL (ref 0.7–4.0)
MCH: 26.8 pg (ref 26.0–34.0)
MCHC: 31.3 g/dL (ref 30.0–36.0)
MCV: 85.7 fL (ref 80.0–100.0)
Monocytes Absolute: 0.6 10*3/uL (ref 0.1–1.0)
Monocytes Relative: 7 %
Neutro Abs: 7.7 10*3/uL (ref 1.7–7.7)
Neutrophils Relative %: 82 %
Platelets: 257 10*3/uL (ref 150–400)
RBC: 4.67 MIL/uL (ref 3.87–5.11)
RDW: 15.8 % — ABNORMAL HIGH (ref 11.5–15.5)
WBC: 9.3 10*3/uL (ref 4.0–10.5)
nRBC: 0 % (ref 0.0–0.2)

## 2023-11-29 LAB — COMPREHENSIVE METABOLIC PANEL
ALT: 19 U/L (ref 0–44)
AST: 31 U/L (ref 15–41)
Albumin: 4.2 g/dL (ref 3.5–5.0)
Alkaline Phosphatase: 57 U/L (ref 38–126)
Anion gap: 10 (ref 5–15)
BUN: 23 mg/dL (ref 8–23)
CO2: 24 mmol/L (ref 22–32)
Calcium: 11.1 mg/dL — ABNORMAL HIGH (ref 8.9–10.3)
Chloride: 105 mmol/L (ref 98–111)
Creatinine, Ser: 1.41 mg/dL — ABNORMAL HIGH (ref 0.44–1.00)
GFR, Estimated: 41 mL/min — ABNORMAL LOW (ref >60)
Glucose, Bld: 115 mg/dL — ABNORMAL HIGH (ref 70–99)
Potassium: 4.4 mmol/L (ref 3.5–5.1)
Sodium: 139 mmol/L (ref 135–145)
Total Bilirubin: 0.9 mg/dL (ref 0.0–1.2)
Total Protein: 6.9 g/dL (ref 6.5–8.1)

## 2023-11-29 LAB — I-STAT CHEM 8, ED
BUN: 22 mg/dL (ref 8–23)
Calcium, Ion: 1.42 mmol/L — ABNORMAL HIGH (ref 1.15–1.40)
Chloride: 107 mmol/L (ref 98–111)
Creatinine, Ser: 1.5 mg/dL — ABNORMAL HIGH (ref 0.44–1.00)
Glucose, Bld: 108 mg/dL — ABNORMAL HIGH (ref 70–99)
HCT: 40 % (ref 36.0–46.0)
Hemoglobin: 13.6 g/dL (ref 12.0–15.0)
Potassium: 4.3 mmol/L (ref 3.5–5.1)
Sodium: 139 mmol/L (ref 135–145)
TCO2: 22 mmol/L (ref 22–32)

## 2023-11-29 MED ORDER — OXYCODONE-ACETAMINOPHEN 5-325 MG PO TABS: ORAL_TABLET | ORAL | 0 refills | Status: DC

## 2023-11-29 MED ORDER — IOHEXOL 300 MG/ML  SOLN
80.0000 mL | Freq: Once | INTRAMUSCULAR | Status: AC | PRN
  Administered 2023-11-29: 80 mL via INTRAVENOUS

## 2023-11-29 MED ORDER — SODIUM CHLORIDE 0.9 % IV BOLUS
500.0000 mL | Freq: Once | INTRAVENOUS | Status: AC
  Administered 2023-11-29: 500 mL via INTRAVENOUS

## 2023-11-29 MED ORDER — OXYCODONE-ACETAMINOPHEN 5-325 MG PO TABS: 1.0000 | ORAL_TABLET | Freq: Four times a day (QID) | ORAL | 0 refills | Status: DC | PRN
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
## 2023-11-29 NOTE — Discharge Instructions (Addendum)
Rest at home.  Keep your knee elevated.  Follow-up with your family doctor for any problems.  Make sure you get your family doctor to arrange for another scan of your chest in 6-12 months
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
## 2023-11-29 NOTE — Telephone Encounter (Signed)
The pharmacist called because the pharmacy did not have the Percocet that was prescribed by Dr. Estell Harpin.  I did send this to [facility] in [place] which close to where the patient lives.  I did ask the pharmacist to cancel the prescription sent to their location and they agreed.
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
## 2024-03-19 ENCOUNTER — Encounter (HOSPITAL_COMMUNITY): Payer: Self-pay | Admitting: Emergency Medicine

## 2024-03-19 ENCOUNTER — Emergency Department (HOSPITAL_COMMUNITY)
Admission: EM | Admit: 2024-03-19 | Discharge: 2024-03-19 | Disposition: A | Discharge status: Home / Self Care | Attending: Emergency Medicine | Admitting: Emergency Medicine

## 2024-03-19 ENCOUNTER — Other Ambulatory Visit: Payer: Self-pay

## 2024-03-19 DIAGNOSIS — R6883 Chills (without fever): Secondary | ICD-10-CM | POA: Insufficient documentation

## 2024-03-19 DIAGNOSIS — D72829 Elevated white blood cell count, unspecified: Secondary | ICD-10-CM | POA: Insufficient documentation

## 2024-03-19 DIAGNOSIS — R112 Nausea with vomiting, unspecified: Secondary | ICD-10-CM | POA: Insufficient documentation

## 2024-03-19 DIAGNOSIS — R109 Unspecified abdominal pain: Secondary | ICD-10-CM | POA: Insufficient documentation

## 2024-03-19 DIAGNOSIS — R197 Diarrhea, unspecified: Secondary | ICD-10-CM | POA: Insufficient documentation

## 2024-03-19 LAB — URINALYSIS, W/ REFLEX TO CULTURE (INFECTION SUSPECTED)
Bilirubin Urine: NEGATIVE
Glucose, UA: NEGATIVE mg/dL
Hgb urine dipstick: NEGATIVE
Ketones, ur: NEGATIVE mg/dL
Nitrite: NEGATIVE
Protein, ur: 30 mg/dL — AB
Specific Gravity, Urine: 1.018 (ref 1.005–1.030)
pH: 8 (ref 5.0–8.0)

## 2024-03-19 LAB — CBC WITH DIFFERENTIAL/PLATELET
Abs Immature Granulocytes: 0.06 10*3/uL (ref 0.00–0.07)
Basophils Absolute: 0 10*3/uL (ref 0.0–0.1)
Basophils Relative: 0 %
Eosinophils Absolute: 0.1 10*3/uL (ref 0.0–0.5)
Eosinophils Relative: 0 %
HCT: 41.2 % (ref 36.0–46.0)
Hemoglobin: 13.1 g/dL (ref 12.0–15.0)
Immature Granulocytes: 1 %
Lymphocytes Relative: 4 %
Lymphs Abs: 0.5 10*3/uL — ABNORMAL LOW (ref 0.7–4.0)
MCH: 28.3 pg (ref 26.0–34.0)
MCHC: 31.8 g/dL (ref 30.0–36.0)
MCV: 89 fL (ref 80.0–100.0)
Monocytes Absolute: 0.5 10*3/uL (ref 0.1–1.0)
Monocytes Relative: 5 %
Neutro Abs: 10.1 10*3/uL — ABNORMAL HIGH (ref 1.7–7.7)
Neutrophils Relative %: 90 %
Platelets: 234 10*3/uL (ref 150–400)
RBC: 4.63 MIL/uL (ref 3.87–5.11)
RDW: 14.5 % (ref 11.5–15.5)
WBC: 11.3 10*3/uL — ABNORMAL HIGH (ref 4.0–10.5)
nRBC: 0 % (ref 0.0–0.2)

## 2024-03-19 LAB — COMPREHENSIVE METABOLIC PANEL WITH GFR
ALT: 18 U/L (ref 0–44)
AST: 28 U/L (ref 15–41)
Albumin: 4.1 g/dL (ref 3.5–5.0)
Alkaline Phosphatase: 55 U/L (ref 38–126)
Anion gap: 9 (ref 5–15)
BUN: 22 mg/dL (ref 8–23)
CO2: 26 mmol/L (ref 22–32)
Calcium: 10.6 mg/dL — ABNORMAL HIGH (ref 8.9–10.3)
Chloride: 103 mmol/L (ref 98–111)
Creatinine, Ser: 1.57 mg/dL — ABNORMAL HIGH (ref 0.44–1.00)
GFR, Estimated: 36 mL/min — ABNORMAL LOW (ref >60)
Glucose, Bld: 169 mg/dL — ABNORMAL HIGH (ref 70–99)
Potassium: 3.8 mmol/L (ref 3.5–5.1)
Sodium: 138 mmol/L (ref 135–145)
Total Bilirubin: 0.9 mg/dL (ref 0.0–1.2)
Total Protein: 6.8 g/dL (ref 6.5–8.1)

## 2024-03-19 LAB — C DIFFICILE QUICK SCREEN W PCR REFLEX
C Diff antigen: NEGATIVE
C Diff interpretation: NOT DETECTED
C Diff toxin: NEGATIVE

## 2024-03-19 LAB — LIPASE, BLOOD: Lipase: 33 U/L (ref 11–51)

## 2024-03-19 MED ORDER — LACTATED RINGERS IV BOLUS
1000.0000 mL | Freq: Once | INTRAVENOUS | Status: AC
  Administered 2024-03-19: 1000 mL via INTRAVENOUS

## 2024-03-19 MED ORDER — ONDANSETRON HCL 4 MG/2ML IJ SOLN
4.0000 mg | Freq: Once | INTRAMUSCULAR | Status: AC
  Administered 2024-03-19: 4 mg via INTRAVENOUS
  Filled 2024-03-19: qty 2

## 2024-03-19 MED ORDER — ONDANSETRON 4 MG PO TBDP
4.0000 mg | ORAL_TABLET | Freq: Once | ORAL | Status: AC
  Administered 2024-03-19: 4 mg via ORAL
  Filled 2024-03-19: qty 1

## 2024-03-19 MED ORDER — FENTANYL CITRATE PF 50 MCG/ML IJ SOSY
50.0000 ug | PREFILLED_SYRINGE | Freq: Once | INTRAMUSCULAR | Status: AC
  Administered 2024-03-19: 50 ug via INTRAVENOUS
  Filled 2024-03-19: qty 1

## 2024-03-19 MED ORDER — ONDANSETRON HCL 4 MG PO TABS
4.0000 mg | ORAL_TABLET | Freq: Three times a day (TID) | ORAL | 0 refills | Status: AC | PRN
Start: 2024-03-19 — End: ?

## 2024-03-19 NOTE — ED Notes (Signed)
 Cleaned pt up and was unable to obtain a stool sample. Pt is aware that a sample is needed.

## 2024-03-19 NOTE — ED Notes (Signed)
 ED Provider at bedside.

## 2024-03-19 NOTE — ED Notes (Signed)
 Pt ambulated to bathroom with standby assist to void and have BM. Specimens collected and sent to lab. Pt tolerated well, denies lightheadedness or dizziness. Remains c/o nausea. MD notified, see orders.

## 2024-03-19 NOTE — ED Notes (Signed)
 Pt given water  per pt request, tolerating well. Denies N/V.

## 2024-03-19 NOTE — ED Provider Notes (Signed)
 [place] EMERGENCY DEPARTMENT AT [facility] Provider Note   CSN: [id number] Arrival date & time: 03/19/24  0244     History  Chief Complaint  Patient presents with   Emesis   Abdominal Pain    Joy Leach is a 68 y.o. female.  68 year old female presents ER today with diarrhea and emesis.  Nonbloody nonbilious.  She states that this has been going on since about 6:00 this evening.  She has some diffuse abdominal discomfort worse on the left.  She is on antibiotics currently (doxycycline) for the last couple days.  She felt some chills when this first started but no measured fevers. No known sick contacts, abnormal food intake or recent travels.    Emesis Associated symptoms: abdominal pain   Abdominal Pain Associated symptoms: vomiting        Home Medications Prior to Admission medications   Medication Sig Start Date End Date Taking? Authorizing Provider  acetaminophen  (TYLENOL ) 500 MG tablet Take 2 tablets (1,000 mg total) by mouth every 8 (eight) hours as needed for mild pain, fever or headache. 03/09/23   Justina Oman, MD  albuterol  (VENTOLIN  HFA) 108 (90 Base) MCG/ACT inhaler Inhale 1 puff into the lungs every 6 (six) hours as needed for shortness of breath.    [provider]  amoxicillin-clavulanate (AUGMENTIN) 875-125 MG tablet Take 1 tablet by mouth 2 (two) times daily. 09/22/23   [provider]  apixaban  (ELIQUIS ) 5 MG TABS tablet Take 1 tablet (5 mg total) by mouth 2 (two) times daily. To be resume at the moment of discharge and after all procedures completed. 03/09/23   Justina Oman, MD  azelastine (ASTELIN) 0.1 % nasal spray Place 1 spray into both nostrils daily. 01/14/23 01/14/24  [provider]  ezetimibe (ZETIA) 10 MG tablet Take 10 mg by mouth daily.    [provider]  fenofibrate (TRICOR) 145 MG tablet Take 145 mg by mouth every evening. 03/13/21   [provider]  ferrous sulfate  325 (65 FE) MG EC  tablet Take 1 tablet (325 mg total) by mouth daily with breakfast. Resume at time of discharge 03/09/23   Justina Oman, MD  furosemide  (LASIX ) 20 MG tablet TAKE (1) TABLET BY MOUTH ONCE DAILY AS NEEDED. Patient taking differently: Take 5 mg by mouth daily as needed (leg swelling). Half of 10 mg 06/19/17   Lawrence, Kathryn M, NP  hydrocortisone  (ANUSOL -HC) 2.5 % rectal cream Place 1 Application rectally 4 (four) times daily. Use four times per day x10 days then PRN thereafter Patient taking differently: Place 1 Application rectally daily as needed for hemorrhoids or anal itching. 06/24/22   Carlan, Chelsea L, NP  levocetirizine (XYZAL) 5 MG tablet Take 5 mg by mouth every evening.    [provider]  loperamide (IMODIUM A-D) 2 MG tablet Take 2 mg by mouth 2 (two) times daily as needed for diarrhea or loose stools.    [provider]  Multiple Vitamin (MULTIVITAMIN) tablet Take 1 tablet by mouth in the morning.    [provider]  ondansetron  (ZOFRAN -ODT) 4 MG disintegrating tablet Take 1 tablet (4 mg total) by mouth every 8 (eight) hours as needed for nausea or vomiting. 01/30/23   McKenzie, Arden Beck, MD  oxyCODONE -acetaminophen  (PERCOCET/ROXICET) 5-325 MG tablet Take 1 every 6 hours for pain that is not relieved by Tylenol  alone 11/29/23   Baxter Limber A, PA-C  oxyCODONE -acetaminophen  (PERCOCET/ROXICET) 5-325 MG tablet Take 1 tablet by mouth every 6 (six) hours  as needed for severe pain (pain score 7-10). 11/29/23   Carin Charleston, MD  pantoprazole  (PROTONIX ) 40 MG tablet TAKE ONE TABLET BY MOUTH EVERY DAY 08/25/23   Carlan, Chelsea L, NP  pioglitazone (ACTOS) 15 MG tablet Take 15 mg by mouth daily.    [provider]  polyvinyl alcohol  (LIQUIFILM TEARS) 1.4 % ophthalmic solution Place 1 drop into both eyes as needed for dry eyes. 03/09/23   Justina Oman, MD  predniSONE  5 MG TBEC Take 5 mg by mouth daily with breakfast.    [provider]   promethazine -dextromethorphan (PROMETHAZINE -DM) 6.25-15 MG/5ML syrup SMARTSIG:1 teaspoon By Mouth Every 4-6 Hours PRN 09/22/23   [provider]  sitaGLIPtin-metformin (JANUMET) 50-1000 MG tablet Take 1 tablet by mouth 2 (two) times daily with a meal.    [provider]      Allergies    Sulfa antibiotics, Caffeine, and Codeine    Review of Systems   Review of Systems  Gastrointestinal:  Positive for abdominal pain and vomiting.    Physical Exam Updated Vital Signs BP (!) 142/71 (BP Location: Left Arm)   Pulse 84   Temp 98.6 F (37 C) (Oral)   Resp 20   Ht 5\' 5"  (1.651 m)   Wt 87.5 kg   SpO2 93%   BMI 32.12 kg/m  Physical Exam Vitals and nursing note reviewed.  Constitutional:      Appearance: She is well-developed.  HENT:     Head: Normocephalic and atraumatic.  Cardiovascular:     Rate and Rhythm: Normal rate and regular rhythm.  Pulmonary:     Effort: No respiratory distress.     Breath sounds: No stridor.  Abdominal:     General: There is no distension.     Tenderness: There is generalized abdominal tenderness.  Musculoskeletal:     Cervical back: Normal range of motion.  Neurological:     Mental Status: She is alert.     ED Results / Procedures / Treatments   Labs (all labs ordered are listed, but only abnormal results are displayed) Labs Reviewed  C DIFFICILE QUICK SCREEN W PCR REFLEX    GASTROINTESTINAL PANEL BY PCR, STOOL (REPLACES STOOL CULTURE)  CBC WITH DIFFERENTIAL/PLATELET  COMPREHENSIVE METABOLIC PANEL WITH GFR  LIPASE, BLOOD  URINALYSIS, W/ REFLEX TO CULTURE (INFECTION SUSPECTED)    EKG None  Radiology No results found.  Procedures Procedures    Medications Ordered in ED Medications  lactated ringers  bolus 1,000 mL (has no administration in time range)  ondansetron  (ZOFRAN ) injection 4 mg (has no administration in time range)  fentaNYL  (SUBLIMAZE ) injection 50 mcg (has no administration in time range)    ED  Course/ Medical Decision Making/ A&P                                 Medical Decision Making Amount and/or Complexity of Data Reviewed Labs: ordered.  Risk Prescription drug management.   Likely gastroenteritis? No focal abdominal pain to suggest diverticulitis, appendicitis or other need for imaging or further workup/observation. No fever. Labs reassuring. Tolerating PO liquids/solids. Prepack for zofran  provided. Stable for d/c.     Final Clinical Impression(s) / ED Diagnoses Final diagnoses:  None    Rx / DC Orders ED Discharge Orders     None         Alishea Beaudin, Reymundo Caulk, MD 03/19/24 [phone]

## 2024-03-19 NOTE — ED Triage Notes (Signed)
 Pt been on abx for abscess behind left knee since Tues. C/o Emesis, diarrhea, headache since 0600. Pt states at least 10x emesis and diarrhea bouts. Pt denies being around anyone sick.

## 2024-03-19 NOTE — ED Notes (Signed)
 Pt attempted to sign MSE, pad not working at this time

## 2024-03-20 LAB — GASTROINTESTINAL PANEL BY PCR, STOOL (REPLACES STOOL CULTURE)

## 2024-03-20 LAB — URINE CULTURE: Culture: <10000 — AB

## 2024-03-22 ENCOUNTER — Ambulatory Visit (INDEPENDENT_AMBULATORY_CARE_PROVIDER_SITE_OTHER): Admitting: Gastroenterology

## 2024-03-22 ENCOUNTER — Encounter (INDEPENDENT_AMBULATORY_CARE_PROVIDER_SITE_OTHER): Payer: Self-pay | Admitting: Gastroenterology

## 2024-03-22 VITALS — BP 123/75 | HR 84 | Temp 97.1°F | Ht 65.0 in | Wt 185.5 lb

## 2024-03-22 DIAGNOSIS — A084 Viral intestinal infection, unspecified: Secondary | ICD-10-CM | POA: Diagnosis not present

## 2024-03-22 MED ORDER — DICYCLOMINE HCL 10 MG PO CAPS: 10.0000 mg | ORAL_CAPSULE | Freq: Three times a day (TID) | ORAL | 0 refills | Status: DC | PRN

## 2024-03-22 NOTE — Patient Instructions (Addendum)
 Implement a bland BRAT diet for the next 2-3 weeks Continue with Zofran  as needed for nausea and vomiting Can take Bentyl as needed for diarrhea and abdominal pain Follow closely with PCP regarding new symptoms ED precautions provided -if worsening or persistent symptoms, feeling unsteady or lightheaded, may need to have evaluation in the ER

## 2024-03-22 NOTE — Progress Notes (Unsigned)
 Samantha Cress, M.D. Gastroenterology & Hepatology [facility] [address] [place], [place] [address]  Primary Care Physician: Madalyn Scarce, FNP [address] [place] [address]  I will communicate my assessment and recommendations to the referring MD via EMR.  Problems: New onset of vomiting and diarrhea History of choledocholithiasis  History of Present Illness: Kyree Fedorko is a 68 y.o. 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  The patient was last seen on 09/29/2019 for. At that time, the patient was given topical management for treatment of hemorrhoids and was continued on Protonix  40 mg every day.  Patient reports that since last Thursday she has presented recurrent episodes of nausea with vomiting, as well as multiple episodes of watery black stools. She was doing well prior to this day. She has alsop presented some abdominal pain in her lower abdomen.  The patient denies having any nausea, vomiting, fever, chills, hematemesis, jaundice, pruritus or weight loss.  No sick contacts.  Patient went to the ER on 03/19/2024 after presenting diarrhea and vomiting.  Previously started on doxycycline on Tuesday for a cyst behind her knee, which she took for 3 days.  Labs showed a WBC of 11.3, normal cell lines otherwise, CMP with creatinine 1.57, normal liver function panel and electrolytes, urinalysis with 21-50 white blood cells, otherwise normal, negative C. difficile testing.  GI pathogen was positive for astrovirus.  She has been taking kaopectate for diarrhea, as well as Zofran . She reports still feeling nauseated despite taking the medication. She has had 3 episodes of diarrhea today - stools are still black. Abdominal pain is slightly better compared to  prior. Still nauseated but has not vomited since Friday. Very scant amount of blood in stool.  Last Colonoscopy:04/02/22 - Three 3 to 5 mm polyps in the ascending colon and in the cecum - One 2 mm polyp in the ascending colon - Two 4 to 5 mm polyps in the transverse colon  - Diverticulosis in the sigmoid colon. Biopsied. - Congested mucosa in the rectum. normal colon Biopsied. - Non-bleeding external and internal hemorrhoids. - Hemorrhoids found on perianal exam. (6 TAs, normal colonic biopsies)  Last Endoscopy:10/26/21 normal esophagus  - Multiple gastric polyps. Fundic gland polyps - rest of the examined stomach was normal. Biopsied-normal - Normal duodenum. Biopsied-normal  ERCP 03/11/2023 by Dr. Areatha Beecham. CBD measuring 11 mm and there was no presence of filling defects in the bile duct, with normal intrahepatic ducts. The biliary tree was swept with a 12 mm balloon and some sludge was removed.   Past Medical History: Past Medical History:  Diagnosis Date   Basal cell carcinoma (BCC) of skin of face 10/2023   left side   Chronic kidney disease    Diabetes mellitus without complication (HCC)    H/O blood clots    Headache    History of hypertension    Hyperlipidemia    Lupus    Mycobacterial disease    Sicca (HCC)     Past Surgical History: Past Surgical History:  Procedure Laterality Date   ABDOMINAL HYSTERECTOMY     BIOPSY  10/26/2021   Procedure: BIOPSY;  Surgeon: Urban Garden, MD;  [facility];  Service: Gastroenterology;;   BIOPSY  04/02/2022   Procedure: BIOPSY;  Surgeon: Urban Garden, MD;  [facility];  Service: Gastroenterology;;   CHOLECYSTECTOMY N/A 03/07/2023   Procedure: LAPAROSCOPIC CHOLECYSTECTOMY;  Surgeon: Marijo Shove, DO;  Location: [facility];  Service: General;  Laterality: N/A;   COLONOSCOPY  05/26/2015   Dr Alline Ivans, mild diverticulosis in sigmoid colon, biosies taken from [facility] and sigmoid  colon-negative   COLONOSCOPY WITH PROPOFOL  N/A 04/02/2022   Procedure: COLONOSCOPY WITH PROPOFOL ;  Surgeon: Urban Garden, MD;  [facility];  Service: Gastroenterology;  Laterality: N/A;  815   CYSTOSCOPY WITH RETROGRADE PYELOGRAM, URETEROSCOPY AND STENT PLACEMENT Right 01/30/2023   Procedure: CYSTOSCOPY WITH RETROGRADE PYELOGRAM, URETEROSCOPY AND STENT PLACEMENT;  Surgeon: Marco Severs, MD;  Location: [facility];  Service: Urology;  Laterality: Right;   ESOPHAGOGASTRODUODENOSCOPY (EGD) WITH PROPOFOL  N/A 10/26/2021   Procedure: ESOPHAGOGASTRODUODENOSCOPY (EGD) WITH PROPOFOL ;  Surgeon: Urban Garden, MD;  [facility];  Service: Gastroenterology;  Laterality: N/A;  1045   EXTRACORPOREAL SHOCK WAVE LITHOTRIPSY Right 12/03/2022   Procedure: EXTRACORPOREAL SHOCK WAVE LITHOTRIPSY (ESWL);  Surgeon: Marco Severs, MD;  Location: [facility];  Service: Urology;  Laterality: Right;   HEMOSTASIS CLIP PLACEMENT  10/26/2021   Procedure: HEMOSTASIS CLIP PLACEMENT;  Surgeon: Urban Garden, MD;  [facility];  Service: Gastroenterology;;   HOLMIUM LASER APPLICATION Right 01/30/2023   Procedure: HOLMIUM LASER APPLICATION;  Surgeon: Marco Severs, MD;  [facility]: [facility];  Service: Urology;  Laterality: Right;   HOT HEMOSTASIS  10/26/2021   Procedure: HOT HEMOSTASIS (ARGON PLASMA COAGULATION/BICAP);  Surgeon: Umberto Ganong, Bearl Limes, MD;  [facility];  Service: Gastroenterology;;   hystorectomy     POLYPECTOMY  10/26/2021   Procedure: POLYPECTOMY;  Surgeon: Urban Garden, MD;  [facility];  Service: Gastroenterology;;   POLYPECTOMY  04/02/2022   Procedure: POLYPECTOMY;  Surgeon: Umberto Ganong, Bearl Limes, MD;  [facility];  Service: Gastroenterology;;   SINUS SURGERY WITH INSTATRAK  2015   SKIN BIOPSY      Family History: Family History  Problem Relation Age of Onset    Hypertension Mother    Diabetes Mother    Kidney disease Mother    Heart disease Mother    High Cholesterol Mother    Lung cancer Mother    Hypertension Father    Diabetes Father    Kidney disease Father    Heart disease Father     Social History: Social History   Tobacco Use  Smoking Status Never   Passive exposure: Never  Smokeless Tobacco Never   Social History   Substance and Sexual Activity  Alcohol  Use No   Social History   Substance and Sexual Activity  Drug Use No    Allergies: Allergies  Allergen Reactions   Sulfa Antibiotics Hives   Caffeine Other (See Comments)    Bladder infections   Codeine Nausea And Vomiting    Medications: Current Outpatient Medications  Medication Sig Dispense Refill   acetaminophen  (TYLENOL ) 500 MG tablet Take 2 tablets (1,000 mg total) by mouth every 8 (eight) hours as needed for mild pain, fever or headache.     apixaban  (ELIQUIS ) 5 MG TABS tablet Take 1 tablet (5 mg total) by mouth 2 (two) times daily. To be resume at the moment of discharge and after all procedures completed.     azelastine (ASTELIN) 0.1 % nasal spray Place 1 spray into both nostrils daily.     doxycycline (MONODOX) 100 MG capsule Take 100 mg by mouth daily.     ezetimibe (ZETIA)  10 MG tablet Take 10 mg by mouth daily.     fenofibrate (TRICOR) 145 MG tablet Take 145 mg by mouth every evening.     ferrous sulfate  325 (65 FE) MG EC tablet Take 1 tablet (325 mg total) by mouth daily with breakfast. Resume at time of discharge     furosemide  (LASIX ) 20 MG tablet TAKE (1) TABLET BY MOUTH ONCE DAILY AS NEEDED. (Patient taking differently: Take 20 mg by mouth daily.) 30 tablet 0   hydrocortisone  (ANUSOL -HC) 2.5 % rectal cream Place 1 Application rectally 4 (four) times daily. Use four times per day x10 days then PRN thereafter (Patient taking differently: Place 1 Application rectally daily as needed for hemorrhoids or anal itching.) 56 g 1   levocetirizine (XYZAL) 5  MG tablet Take 5 mg by mouth every evening.     loperamide (IMODIUM A-D) 2 MG tablet Take 2 mg by mouth 2 (two) times daily as needed for diarrhea or loose stools.     metFORMIN (GLUCOPHAGE) 1000 MG tablet Take 1,000 mg by mouth 2 (two) times daily.     Multiple Vitamin (MULTIVITAMIN) tablet Take 1 tablet by mouth in the morning.     ondansetron  (ZOFRAN ) 4 MG tablet Take 1 tablet (4 mg total) by mouth every 8 (eight) hours as needed for nausea or vomiting. 4 tablet 0   pantoprazole  (PROTONIX ) 40 MG tablet TAKE ONE TABLET BY MOUTH EVERY DAY 90 tablet 2   pioglitazone (ACTOS) 15 MG tablet Take 15 mg by mouth daily.     polyvinyl alcohol  (LIQUIFILM TEARS) 1.4 % ophthalmic solution Place 1 drop into both eyes as needed for dry eyes.     predniSONE  5 MG TBEC Take 5 mg by mouth daily with breakfast.     albuterol  (VENTOLIN  HFA) 108 (90 Base) MCG/ACT inhaler Inhale 1 puff into the lungs every 6 (six) hours as needed for shortness of breath. (Patient not taking: Reported on 03/22/2024)     No current facility-administered medications for this visit.    Review of Systems: GENERAL: negative for malaise, night sweats HEENT: No changes in hearing or vision, no nose bleeds or other nasal problems. NECK: Negative for lumps, goiter, pain and significant neck swelling RESPIRATORY: Negative for cough, wheezing CARDIOVASCULAR: Negative for chest pain, leg swelling, palpitations, orthopnea GI: SEE HPI MUSCULOSKELETAL: Negative for joint pain or swelling, back pain, and muscle pain. SKIN: Negative for lesions, rash PSYCH: Negative for sleep disturbance, mood disorder and recent psychosocial stressors. HEMATOLOGY Negative for prolonged bleeding, bruising easily, and swollen nodes. ENDOCRINE: Negative for cold or heat intolerance, polyuria, polydipsia and goiter. NEURO: negative for tremor, gait imbalance, syncope and seizures. The remainder of the review of systems is noncontributory.   Physical Exam: BP  123/75 (BP Location: Left Arm, Patient Position: Sitting, Cuff Size: Small)   Pulse 84   Temp (!) 97.1 F (36.2 C) (Temporal)   Ht 5\' 5"  (1.651 m)   Wt 185 lb 8 oz (84.1 kg)   BMI 30.87 kg/m  GENERAL: The patient is AO x3, in no acute distress. HEENT: Head is normocephalic and atraumatic. EOMI are intact. Mouth is well hydrated and without lesions. NECK: Supple. No masses LUNGS: Clear to auscultation. No presence of rhonchi/wheezing/rales. Adequate chest expansion HEART: RRR, normal s1 and s2. ABDOMEN: Mildly tender upon palpation of the right lower quadrant, no guarding, no peritoneal signs, and nondistended. BS +. No masses. EXTREMITIES: Without any cyanosis, clubbing, rash, lesions or edema. NEUROLOGIC: AOx3, no focal motor  deficit. SKIN: no jaundice, no rashes  Imaging/Labs: as above  I personally reviewed and interpreted the available labs, imaging and endoscopic files.  Impression and Plan: Harlean Regula is a 68 y.o. female coming for follow up of ***   All questions were answered.      Samantha Cress, MD Gastroenterology and Hepatology [facility]

## 2024-03-29 ENCOUNTER — Telehealth: Payer: Self-pay

## 2024-03-29 ENCOUNTER — Ambulatory Visit (INDEPENDENT_AMBULATORY_CARE_PROVIDER_SITE_OTHER): Payer: Medicare HMO | Admitting: Gastroenterology

## 2024-03-29 NOTE — Telephone Encounter (Signed)
 Pt called to confirm she needed an xray before her upcomig appt pt notified she does need to complete KUB prior to her appt

## 2024-03-31 ENCOUNTER — Telehealth: Payer: Self-pay

## 2024-03-31 NOTE — Telephone Encounter (Signed)
Patient is made aware to get KUB prior to appointment. Voiced understanding

## 2024-03-31 NOTE — Progress Notes (Signed)
 Name: Joy Leach DOB: 02/04/1956 MRN: [id number]  History of Present Illness: Joy Leach is a 68 y.o. female who presents today for follow up visit at [facility] [place]. Relevant History includes: 1. Kidney stones. - Prior stone procedures have included ESWL and ureteroscopic stone manipulation.  - 01/30/2023: Underwent right ureteroscopic stone manipulation by Dr. Sherrilee. - 03/06/2023: CT stone showed non-obstructing <3 mm bilateral nephrolithiasis.  At last visit on 09/30/2023: Doing well.  Today: KUB today: Awaiting radiology read; two left sided 3-4 mm stones appreciated per provider interpretation.  She reports that she is getting over a stomach bug - for the past 2 weeks has had generalized abdominal pain, nausea, vomiting, diarrhea.   She denies recent stone passage. Denies flank pain, increased urinary urgency, frequency, nocturia, dysuria, gross hematuria, hesitancy, straining to void, or sensations of incomplete emptying.  Medications: Current Outpatient Medications  Medication Sig Dispense Refill   acetaminophen  (TYLENOL ) 500 MG tablet Take 2 tablets (1,000 mg total) by mouth every 8 (eight) hours as needed for mild pain, fever or headache.     amoxicillin-clavulanate (AUGMENTIN) 875-125 MG tablet Take 1 tablet by mouth 2 (two) times daily.     apixaban  (ELIQUIS ) 5 MG TABS tablet Take 1 tablet (5 mg total) by mouth 2 (two) times daily. To be resume at the moment of discharge and after all procedures completed.     azelastine (ASTELIN) 0.1 % nasal spray Place 1 spray into both nostrils daily.     dicyclomine  (BENTYL ) 10 MG capsule Take 1 capsule (10 mg total) by mouth every 8 (eight) hours as needed (abdominal pain or diarrhea). 90 capsule 0   ezetimibe (ZETIA) 10 MG tablet Take 10 mg by mouth daily.     fenofibrate (TRICOR) 145 MG tablet Take 145 mg by mouth every evening.     ferrous sulfate  325 (65 FE) MG EC tablet Take 1 tablet (325 mg total) by mouth  daily with breakfast. Resume at time of discharge     furosemide  (LASIX ) 20 MG tablet TAKE (1) TABLET BY MOUTH ONCE DAILY AS NEEDED. (Patient taking differently: Take 20 mg by mouth daily.) 30 tablet 0   hydrocortisone  (ANUSOL -HC) 2.5 % rectal cream Place 1 Application rectally 4 (four) times daily. Use four times per day x10 days then PRN thereafter (Patient taking differently: Place 1 Application rectally daily as needed for hemorrhoids or anal itching.) 56 g 1   levocetirizine (XYZAL) 5 MG tablet Take 5 mg by mouth every evening.     loperamide (IMODIUM A-D) 2 MG tablet Take 2 mg by mouth 2 (two) times daily as needed for diarrhea or loose stools.     metFORMIN (GLUCOPHAGE) 1000 MG tablet Take 1,000 mg by mouth 2 (two) times daily.     Multiple Vitamin (MULTIVITAMIN) tablet Take 1 tablet by mouth in the morning.     ondansetron  (ZOFRAN ) 4 MG tablet Take 1 tablet (4 mg total) by mouth every 8 (eight) hours as needed for nausea or vomiting. 4 tablet 0   pantoprazole  (PROTONIX ) 40 MG tablet TAKE ONE TABLET BY MOUTH EVERY DAY 90 tablet 2   pioglitazone (ACTOS) 15 MG tablet Take 15 mg by mouth daily.     polyvinyl alcohol  (LIQUIFILM TEARS) 1.4 % ophthalmic solution Place 1 drop into both eyes as needed for dry eyes.     predniSONE  5 MG TBEC Take 5 mg by mouth daily with breakfast.     albuterol  (VENTOLIN  HFA) 108 (90 Base) MCG/ACT  inhaler Inhale 1 puff into the lungs every 6 (six) hours as needed for shortness of breath. (Patient not taking: Reported on 03/22/2024)     doxycycline (MONODOX) 100 MG capsule Take 100 mg by mouth daily. (Patient not taking: Reported on 04/01/2024)     No current facility-administered medications for this visit.    Allergies: Allergies  Allergen Reactions   Sulfa Antibiotics Hives   Caffeine Other (See Comments)    Bladder infections   Codeine Nausea And Vomiting    Past Medical History:  Diagnosis Date   Basal cell carcinoma (BCC) of skin of face 10/2023   left  side   Chronic kidney disease    Diabetes mellitus without complication (HCC)    H/O blood clots    Headache    History of hypertension    Hyperlipidemia    Lupus    Mycobacterial disease    Sicca (HCC)    Past Surgical History:  Procedure Laterality Date   ABDOMINAL HYSTERECTOMY     BIOPSY  10/26/2021   Procedure: BIOPSY;  Surgeon: Eartha Angelia Sieving, MD;  [facility];  Service: Gastroenterology;;   BIOPSY  04/02/2022   Procedure: BIOPSY;  Surgeon: Eartha Angelia Sieving, MD;  [facility];  Service: Gastroenterology;;   CHOLECYSTECTOMY N/A 03/07/2023   Procedure: LAPAROSCOPIC CHOLECYSTECTOMY;  Surgeon: Evonnie Dorothyann LABOR, DO;  Location: [facility];  Service: General;  Laterality: N/A;   COLONOSCOPY  05/26/2015   Dr donnel, mild diverticulosis in sigmoid colon, biosies taken from [facility] and sigmoid colon-negative   COLONOSCOPY WITH PROPOFOL  N/A 04/02/2022   Procedure: COLONOSCOPY WITH PROPOFOL ;  Surgeon: Eartha Angelia Sieving, MD;  [facility];  Service: Gastroenterology;  Laterality: N/A;  815   CYSTOSCOPY WITH RETROGRADE PYELOGRAM, URETEROSCOPY AND STENT PLACEMENT Right 01/30/2023   Procedure: CYSTOSCOPY WITH RETROGRADE PYELOGRAM, URETEROSCOPY AND STENT PLACEMENT;  Surgeon: Sherrilee Belvie CROME, MD;  Location: [facility];  Service: Urology;  Laterality: Right;   ESOPHAGOGASTRODUODENOSCOPY (EGD) WITH PROPOFOL  N/A 10/26/2021   Procedure: ESOPHAGOGASTRODUODENOSCOPY (EGD) WITH PROPOFOL ;  Surgeon: Eartha Angelia Sieving, MD;  [facility];  Service: Gastroenterology;  Laterality: N/A;  1045   EXTRACORPOREAL SHOCK WAVE LITHOTRIPSY Right 12/03/2022   Procedure: EXTRACORPOREAL SHOCK WAVE LITHOTRIPSY (ESWL);  Surgeon: Sherrilee Belvie CROME, MD;  Location: [facility];  Service: Urology;  Laterality: Right;   HEMOSTASIS CLIP PLACEMENT  10/26/2021   Procedure: HEMOSTASIS CLIP PLACEMENT;  Surgeon: Eartha Angelia Sieving, MD;  [facility];  Service: Gastroenterology;;   HOLMIUM LASER APPLICATION Right 01/30/2023   Procedure: HOLMIUM LASER APPLICATION;  Surgeon: Sherrilee Belvie CROME, MD;  Location: [facility];  Service: Urology;  Laterality: Right;   HOT HEMOSTASIS  10/26/2021   Procedure: HOT HEMOSTASIS (ARGON PLASMA COAGULATION/BICAP);  Surgeon: Eartha Angelia, Sieving, MD;  [facility];  Service: Gastroenterology;;   hystorectomy     POLYPECTOMY  10/26/2021   Procedure: POLYPECTOMY;  Surgeon: Eartha Angelia Sieving, MD;  [facility];  Service: Gastroenterology;;   POLYPECTOMY  04/02/2022   Procedure: POLYPECTOMY;  Surgeon: Eartha Angelia Sieving, MD;  [facility];  Service: Gastroenterology;;   SINUS SURGERY WITH INSTATRAK  2015   SKIN BIOPSY     Family History  Problem Relation Age of Onset   Hypertension Mother    Diabetes Mother    Kidney disease Mother    Heart disease Mother    High Cholesterol Mother    Lung cancer Mother    Hypertension  Father    Diabetes Father    Kidney disease Father    Heart disease Father    Social History   Socioeconomic History   Marital status: Divorced    Spouse name: Not on file   Number of children: 1   Years of education: Not on file   Highest education level: Not on file  Occupational History   Not on file  Tobacco Use   Smoking status: Never    Passive exposure: Never   Smokeless tobacco: Never  Vaping Use   Vaping status: Never Used  Substance and Sexual Activity   Alcohol  use: No   Drug use: No   Sexual activity: Not Currently    Partners: Male  Other Topics Concern   Not on file  Social History Narrative   Lives home alone.  Widow.    Is Disabled.  One child.  Sister, Orie.   Social Drivers of Corporate investment banker Strain: Low Risk  (11/07/2021)   Received from [facility]   Overall Financial Resource Strain (CARDIA)    Difficulty of Paying Living Expenses: Not hard at all  Food  Insecurity: Low Risk  (03/09/2023)   Received from [facility]   Hunger Vital Sign    Within the past 12 months, you worried that your food would run out before you got money to buy more: Never true    Within the past 12 months, the food you bought just didn't last and you didn't have money to get more. : Never true  Transportation Needs: Unmet Transportation Needs (03/09/2023)   Received from [facility]    In the past 12 months, has lack of reliable transportation kept you from medical appointments, meetings, work or from getting things needed for daily living? : Yes  Physical Activity: Not on file  Stress: Not on file  Social Connections: Not on file  Intimate Partner Violence: Not At Risk (03/06/2023)   Humiliation, Afraid, Rape, and Kick questionnaire    Fear of Current or Ex-Partner: No    Emotionally Abused: No    Physically Abused: No    Sexually Abused: No    SUBJECTIVE  Review of Systems Constitutional: Patient denies any unintentional weight loss or change in strength lntegumentary: Patient denies any rashes or pruritus Cardiovascular: Patient denies chest pain or syncope Respiratory: Patient denies shortness of breath Gastrointestinal: As per HPI Musculoskeletal: Patient denies muscle cramps or weakness Neurologic: Patient denies convulsions or seizures Allergic/Immunologic: Patient denies recent allergic reaction(s) Hematologic/Lymphatic: Patient denies bleeding tendencies Endocrine: Patient denies heat/cold intolerance  GU: As per HPI.  OBJECTIVE Vitals:   04/01/24 0945  BP: 111/66  Pulse: 62   There is no height or weight on file to calculate BMI.  Physical Examination Constitutional: No obvious distress; patient is non-toxic appearing  Cardiovascular: No visible lower extremity edema.  Respiratory: The patient does not have audible wheezing/stridor; respirations do not appear labored  Gastrointestinal: Abdomen  non-distended Musculoskeletal: Normal ROM of UEs  Skin: No obvious rashes/open sores  Neurologic: CN 2-12 grossly intact Psychiatric: Answered questions appropriately with normal affect  Hematologic/Lymphatic/Immunologic: No obvious bruises or sites of spontaneous bleeding  Urine microscopy: 0-5 WBC/hpf, 3-10 RBC/hpf, 0 bacteria, calcium oxalate crystals present  ASSESSMENT Kidney stones - Plan: Urinalysis, Routine w reflex microscopic, DG Abd 1 View  We reviewed recent imaging results; awaiting radiology results. She appears to be asymptomatic for stone disease today however UA is somewhat suggestive of stone  passage, which may be masked by her viral gastroenteritis at this time. Will plan to follow up in 6 months with KUB for stone surveillance or sooner if needed. Patient verbalized understanding of and agreement with current plan. All questions were answered.  PLAN Advised the following: Maintain adequate fluid intake daily. Return in about 6 months (around 10/01/2024) for Stone, with UA & PVR, will need KUB prior.  Orders Placed This Encounter  Procedures   DG Abd 1 View    Standing Status:   Future    Expected Date:   10/01/2024    Expiration Date:   04/01/2025    Reason for Exam (SYMPTOM  OR DIAGNOSIS REQUIRED):   kidney stone    Preferred imaging location?:   [facility]   Urinalysis, Routine w reflex microscopic    It has been explained that the patient is to follow regularly with their PCP in addition to all other providers involved in their care and to follow instructions provided by these respective offices. Patient advised to contact urology clinic if any urologic-pertaining questions, concerns, new symptoms or problems arise in the interim period.  There are no Patient Instructions on file for this visit.  Electronically signed by:  Lauraine KYM Oz, MSN, FNP-C, CUNP 04/01/2024 11:08 AM

## 2024-04-01 ENCOUNTER — Ambulatory Visit: Payer: Medicare HMO | Admitting: Urology

## 2024-04-01 ENCOUNTER — Ambulatory Visit (HOSPITAL_COMMUNITY)
Admission: RE | Admit: 2024-04-01 | Discharge: 2024-04-01 | Disposition: A | Discharge status: Home / Self Care | Source: Ambulatory Visit | Attending: Urology | Admitting: Urology

## 2024-04-01 ENCOUNTER — Encounter: Payer: Self-pay | Admitting: Urology

## 2024-04-01 VITALS — BP 111/66 | HR 62

## 2024-04-01 DIAGNOSIS — Z87442 Personal history of urinary calculi: Secondary | ICD-10-CM | POA: Diagnosis not present

## 2024-04-01 DIAGNOSIS — Z09 Encounter for follow-up examination after completed treatment for conditions other than malignant neoplasm: Secondary | ICD-10-CM

## 2024-04-01 DIAGNOSIS — N200 Calculus of kidney: Secondary | ICD-10-CM

## 2024-04-01 LAB — URINALYSIS, ROUTINE W REFLEX MICROSCOPIC
Bilirubin, UA: NEGATIVE
Glucose, UA: NEGATIVE
Ketones, UA: NEGATIVE
Leukocytes,UA: NEGATIVE
Nitrite, UA: NEGATIVE
Protein,UA: NEGATIVE
Specific Gravity, UA: 1.025 (ref 1.005–1.030)
Urobilinogen, Ur: 0.2 mg/dL (ref 0.2–1.0)
pH, UA: 5.5 (ref 5.0–7.5)

## 2024-04-01 LAB — COMPREHENSIVE METABOLIC PANEL WITH GFR
ALT: 17 IU/L (ref 0–32)
AST: 18 IU/L (ref 0–40)
Albumin: 4.2 g/dL (ref 3.9–4.9)
Alkaline Phosphatase: 70 IU/L (ref 44–121)
BUN/Creatinine Ratio: 18 (ref 12–28)
BUN: 22 mg/dL (ref 8–27)
Bilirubin Total: 0.4 mg/dL (ref 0.0–1.2)
CO2: 21 mmol/L (ref 20–29)
Calcium: 11.2 mg/dL — ABNORMAL HIGH (ref 8.7–10.3)
Chloride: 105 mmol/L (ref 96–106)
Creatinine, Ser: 1.23 mg/dL — ABNORMAL HIGH (ref 0.57–1.00)
Globulin, Total: 1.7 g/dL (ref 1.5–4.5)
Glucose: 99 mg/dL (ref 70–99)
Potassium: 4.7 mmol/L (ref 3.5–5.2)
Sodium: 142 mmol/L (ref 134–144)
Total Protein: 5.9 g/dL — ABNORMAL LOW (ref 6.0–8.5)
eGFR: 48 mL/min/{1.73_m2} — ABNORMAL LOW (ref >59)

## 2024-04-01 LAB — MICROSCOPIC EXAMINATION: Bacteria, UA: NONE SEEN

## 2024-04-01 LAB — PTH, INTACT AND CALCIUM: PTH: 76 pg/mL — ABNORMAL HIGH (ref 15–65)

## 2024-04-01 LAB — VITAMIN D 25 HYDROXY (VIT D DEFICIENCY, FRACTURES): Vit D, 25-Hydroxy: 48.4 ng/mL (ref 30.0–100.0)

## 2024-04-05 ENCOUNTER — Encounter: Payer: Self-pay | Admitting: "Endocrinology

## 2024-04-05 ENCOUNTER — Ambulatory Visit: Payer: Medicare HMO | Admitting: "Endocrinology

## 2024-04-05 VITALS — BP 116/62 | HR 56 | Ht 65.0 in | Wt 191.4 lb

## 2024-04-05 DIAGNOSIS — Z7984 Long term (current) use of oral hypoglycemic drugs: Secondary | ICD-10-CM | POA: Diagnosis not present

## 2024-04-05 DIAGNOSIS — E212 Other hyperparathyroidism: Secondary | ICD-10-CM | POA: Diagnosis not present

## 2024-04-05 DIAGNOSIS — E782 Mixed hyperlipidemia: Secondary | ICD-10-CM | POA: Diagnosis not present

## 2024-04-05 DIAGNOSIS — E119 Type 2 diabetes mellitus without complications: Secondary | ICD-10-CM | POA: Diagnosis not present

## 2024-04-05 NOTE — Progress Notes (Signed)
 04/05/2024, 12:40 PM  Endocrinology follow-up note  Joy Leach is a 68 y.o.-year-old female, referred by her  Francis Alan CROME, FNP  , for evaluation for hypercalcemia/hyperparathyroidism.   Past Medical History:  Diagnosis Date   Basal cell carcinoma (BCC) of skin of face 10/2023   left side   Chronic kidney disease    Diabetes mellitus without complication (HCC)    H/O blood clots    Headache    History of hypertension    Hyperlipidemia    Lupus    Mycobacterial disease    Sicca (HCC)     Past Surgical History:  Procedure Laterality Date   ABDOMINAL HYSTERECTOMY     BIOPSY  10/26/2021   Procedure: BIOPSY;  Surgeon: Eartha Angelia Sieving, MD;  [facility];  Service: Gastroenterology;;   BIOPSY  04/02/2022   Procedure: BIOPSY;  Surgeon: Eartha Angelia Sieving, MD;  [facility];  Service: Gastroenterology;;   CHOLECYSTECTOMY N/A 03/07/2023   Procedure: LAPAROSCOPIC CHOLECYSTECTOMY;  Surgeon: Evonnie Dorothyann LABOR, DO;  Location: [facility];  Service: General;  Laterality: N/A;   COLONOSCOPY  05/26/2015   Dr donnel, mild diverticulosis in sigmoid colon, biosies taken from [facility] and sigmoid colon-negative   COLONOSCOPY WITH PROPOFOL  N/A 04/02/2022   Procedure: COLONOSCOPY WITH PROPOFOL ;  Surgeon: Eartha Angelia Sieving, MD;  [facility];  Service: Gastroenterology;  Laterality: N/A;  815   CYSTOSCOPY WITH RETROGRADE PYELOGRAM, URETEROSCOPY AND STENT PLACEMENT Right 01/30/2023   Procedure: CYSTOSCOPY WITH RETROGRADE PYELOGRAM, URETEROSCOPY AND STENT PLACEMENT;  Surgeon: Sherrilee Belvie CROME, MD;  Location: [facility];  Service: Urology;  Laterality: Right;   ESOPHAGOGASTRODUODENOSCOPY (EGD) WITH PROPOFOL  N/A 10/26/2021   Procedure: ESOPHAGOGASTRODUODENOSCOPY (EGD) WITH PROPOFOL ;  Surgeon: Eartha Angelia Sieving, MD;  [facility];  Service: Gastroenterology;  Laterality:  N/A;  1045   EXTRACORPOREAL SHOCK WAVE LITHOTRIPSY Right 12/03/2022   Procedure: EXTRACORPOREAL SHOCK WAVE LITHOTRIPSY (ESWL);  Surgeon: Sherrilee Belvie CROME, MD;  Location: [facility];  Service: Urology;  Laterality: Right;   HEMOSTASIS CLIP PLACEMENT  10/26/2021   Procedure: HEMOSTASIS CLIP PLACEMENT;  Surgeon: Eartha Angelia Sieving, MD;  [facility];  Service: Gastroenterology;;   HOLMIUM LASER APPLICATION Right 01/30/2023   Procedure: HOLMIUM LASER APPLICATION;  Surgeon: Sherrilee Belvie CROME, MD;  [facility]: [facility];  Service: Urology;  Laterality: Right;   HOT HEMOSTASIS  10/26/2021   Procedure: HOT HEMOSTASIS (ARGON PLASMA COAGULATION/BICAP);  Surgeon: Eartha Angelia, Sieving, MD;  [facility];  Service: Gastroenterology;;   hystorectomy     POLYPECTOMY  10/26/2021   Procedure: POLYPECTOMY;  Surgeon: Eartha Angelia Sieving, MD;  [facility];  Service: Gastroenterology;;   POLYPECTOMY  04/02/2022   Procedure: POLYPECTOMY;  Surgeon: Eartha Angelia Sieving, MD;  [facility];  Service: Gastroenterology;;   SINUS SURGERY WITH INSTATRAK  2015   SKIN BIOPSY      Social History   Tobacco Use   Smoking status: Never    Passive exposure: Never   Smokeless tobacco: Never  Vaping Use   Vaping status: Never Used  Substance Use Topics   Alcohol  use: No   Drug  use: No    Family History  Problem Relation Age of Onset   Hypertension Mother    Diabetes Mother    Kidney disease Mother    Heart disease Mother    High Cholesterol Mother    Lung cancer Mother    Hypertension Father    Diabetes Father    Kidney disease Father    Heart disease Father     Outpatient Encounter Medications as of 04/05/2024  Medication Sig   acetaminophen  (TYLENOL ) 500 MG tablet Take 2 tablets (1,000 mg total) by mouth every 8 (eight) hours as needed for mild pain, fever or headache.   albuterol  (VENTOLIN  HFA) 108 (90 Base) MCG/ACT inhaler Inhale 1 puff  into the lungs every 6 (six) hours as needed for shortness of breath. (Patient not taking: Reported on 03/22/2024)   amoxicillin-clavulanate (AUGMENTIN) 875-125 MG tablet Take 1 tablet by mouth 2 (two) times daily. (Patient not taking: Reported on 04/05/2024)   apixaban  (ELIQUIS ) 5 MG TABS tablet Take 1 tablet (5 mg total) by mouth 2 (two) times daily. To be resume at the moment of discharge and after all procedures completed.   azelastine (ASTELIN) 0.1 % nasal spray Place 1 spray into both nostrils daily.   dicyclomine  (BENTYL ) 10 MG capsule Take 1 capsule (10 mg total) by mouth every 8 (eight) hours as needed (abdominal pain or diarrhea).   doxycycline (MONODOX) 100 MG capsule Take 100 mg by mouth daily. (Patient not taking: Reported on 04/01/2024)   ezetimibe (ZETIA) 10 MG tablet Take 10 mg by mouth daily.   fenofibrate (TRICOR) 145 MG tablet Take 145 mg by mouth every evening.   ferrous sulfate  325 (65 FE) MG EC tablet Take 1 tablet (325 mg total) by mouth daily with breakfast. Resume at time of discharge   furosemide  (LASIX ) 20 MG tablet TAKE (1) TABLET BY MOUTH ONCE DAILY AS NEEDED. (Patient taking differently: Take 20 mg by mouth daily.)   hydrocortisone  (ANUSOL -HC) 2.5 % rectal cream Place 1 Application rectally 4 (four) times daily. Use four times per day x10 days then PRN thereafter (Patient taking differently: Place 1 Application rectally daily as needed for hemorrhoids or anal itching.)   levocetirizine (XYZAL) 5 MG tablet Take 5 mg by mouth every evening.   loperamide (IMODIUM A-D) 2 MG tablet Take 2 mg by mouth 2 (two) times daily as needed for diarrhea or loose stools.   metFORMIN (GLUCOPHAGE) 1000 MG tablet Take 1,000 mg by mouth 2 (two) times daily.   Multiple Vitamin (MULTIVITAMIN) tablet Take 1 tablet by mouth in the morning.   ondansetron  (ZOFRAN ) 4 MG tablet Take 1 tablet (4 mg total) by mouth every 8 (eight) hours as needed for nausea or vomiting.   pantoprazole  (PROTONIX ) 40 MG  tablet TAKE ONE TABLET BY MOUTH EVERY DAY   pioglitazone (ACTOS) 15 MG tablet Take 15 mg by mouth daily.   polyvinyl alcohol  (LIQUIFILM TEARS) 1.4 % ophthalmic solution Place 1 drop into both eyes as needed for dry eyes.   predniSONE  5 MG TBEC Take 5 mg by mouth daily with breakfast.   No facility-administered encounter medications on file as of 04/05/2024.    Allergies  Allergen Reactions   Sulfa Antibiotics Hives   Caffeine Other (See Comments)    Bladder infections   Codeine Nausea And Vomiting     HPI  Breonia Kirstein was diagnosed with elevated PTH of 88 on December 06, 2022 associated with mild hypercalcemia.   She is returning to follow-up  with repeat labs after she was seen for hypercalcemia.  Her most recent labs show worsening calcium at 11.2, and increasing PTH at 76.     She has a medical history of lupus with nephropathy on regular nephrology follow-up.  She has no new complaints today.   Patient has no previously known history of parathyroid, pituitary, adrenal dysfunctions; no family history of such dysfunctions. She did have a recent bone density at her rheumatologist.  T-score of the hips were -2.0 consistent with osteopenia.  She is accompanied by her sister to clinic.    She is on maintenance vitamin D  currently replete at 48.  No prior history of fragility fractures or falls.  She is unsure of her remote past history of nephrolithiasis. - She does not report any height loss. She does not control bladder, wears pads and reporting urge incontinence.   she is not on HCTZ or other thiazide therapy.   She has type 2 diabetes on Actos, Janumet.  She also has hyperlipidemia on Tricor. she is not on calcium supplements,  she eats dairy and green, leafy, vegetables on average amounts.  she does not have a family history of hypercalcemia, pituitary tumors, thyroid cancer, or osteoporosis.    ROS:  Constitutional: + Minimally fluctuating body weight,  no fatigue, no  subjective hyperthermia, no subjective hypothermia Eyes: no blurry vision, no xerophthalmia   PE: BP 116/62   Pulse (!) 56   Ht 5' 5 (1.651 m)   Wt 191 lb 6.4 oz (86.8 kg)   BMI 31.85 kg/m , Body mass index is 31.85 kg/m. Wt Readings from Last 3 Encounters:  04/05/24 191 lb 6.4 oz (86.8 kg)  03/22/24 185 lb 8 oz (84.1 kg)  03/19/24 193 lb (87.5 kg)    Constitutional: + BMI of 30.19, not in acute distress, normal state of mind Eyes: PERRLA, EOMI, no exophthalmos ENT: moist mucous membranes, no gross thyromegaly, no gross cervical lymphadenopathy    CMP ( most recent) CMP     Component Value Date/Time   NA 142 03/30/2024 1055   K 4.7 03/30/2024 1055   CL 105 03/30/2024 1055   CO2 21 03/30/2024 1055   GLUCOSE 99 03/30/2024 1055   GLUCOSE 169 (H) 03/19/2024 0323   BUN 22 03/30/2024 1055   CREATININE 1.23 (H) 03/30/2024 1055   CREATININE 1.06 (H) 08/20/2016 1422   CALCIUM 11.2 (H) 03/30/2024 1055   PROT 5.9 (L) 03/30/2024 1055   ALBUMIN 4.2 03/30/2024 1055   AST 18 03/30/2024 1055   ALT 17 03/30/2024 1055   ALKPHOS 70 03/30/2024 1055   BILITOT 0.4 03/30/2024 1055   GFRNONAA 36 (L) 03/19/2024 0323   GFRAA 73 03/11/2018 1034      Lab Results  Component Value Date   TSH 1.13 07/12/2016    Recent Results (from the past 2160 hours)  CBC with Differential     Status: Abnormal   Collection Time: 03/19/24  3:23 AM  Result Value Ref Range   WBC 11.3 (H) 4.0 - 10.5 K/uL   RBC 4.63 3.87 - 5.11 MIL/uL   Hemoglobin 13.1 12.0 - 15.0 g/dL   HCT 58.7 63.9 - 53.9 %   MCV 89.0 80.0 - 100.0 fL   MCH 28.3 26.0 - 34.0 pg   MCHC 31.8 30.0 - 36.0 g/dL   RDW 85.4 88.4 - 84.4 %   Platelets 234 150 - 400 K/uL   nRBC 0.0 0.0 - 0.2 %   Neutrophils Relative % 90 %  Neutro Abs 10.1 (H) 1.7 - 7.7 K/uL   Lymphocytes Relative 4 %   Lymphs Abs 0.5 (L) 0.7 - 4.0 K/uL   Monocytes Relative 5 %   Monocytes Absolute 0.5 0.1 - 1.0 K/uL   Eosinophils Relative 0 %   Eosinophils Absolute  0.1 0.0 - 0.5 K/uL   Basophils Relative 0 %   Basophils Absolute 0.0 0.0 - 0.1 K/uL   Immature Granulocytes 1 %   Abs Immature Granulocytes 0.06 0.00 - 0.07 K/uL    Comment: Performed at [facility], [address] [place], [place] [address]  Comprehensive metabolic panel     Status: Abnormal   Collection Time: 03/19/24  3:23 AM  Result Value Ref Range   Sodium 138 135 - 145 mmol/L   Potassium 3.8 3.5 - 5.1 mmol/L   Chloride 103 98 - 111 mmol/L   CO2 26 22 - 32 mmol/L   Glucose, Bld 169 (H) 70 - 99 mg/dL    Comment: Glucose reference range applies only to samples taken after fasting for at least 8 hours.   BUN 22 8 - 23 mg/dL   Creatinine, Ser 8.42 (H) 0.44 - 1.00 mg/dL   Calcium 89.3 (H) 8.9 - 10.3 mg/dL   Total Protein 6.8 6.5 - 8.1 g/dL   Albumin 4.1 3.5 - 5.0 g/dL   AST 28 15 - 41 U/L   ALT 18 0 - 44 U/L   Alkaline Phosphatase 55 38 - 126 U/L   Total Bilirubin 0.9 0.0 - 1.2 mg/dL   GFR, Estimated 36 (L) >60 mL/min    Comment: (NOTE) Calculated using the CKD-EPI Creatinine Equation (2021)    Anion gap 9 5 - 15    Comment: Performed at [facility], [address] [place], [place] [address]  Lipase, blood     Status: None   Collection Time: 03/19/24  3:23 AM  Result Value Ref Range   Lipase 33 11 - 51 U/L    Comment: Performed at [facility], [address], [place], [place] [address]  Urinalysis, w/ Reflex to Culture (Infection Suspected) -Urine, Clean Catch     Status: Abnormal   Collection Time: 03/19/24  6:30 AM  Result Value Ref Range   Specimen Source URINE, CLEAN CATCH    Color, Urine YELLOW YELLOW   APPearance HAZY (A) CLEAR   Specific Gravity, Urine 1.018 1.005 - 1.030   pH 8.0 5.0 - 8.0   Glucose, UA NEGATIVE NEGATIVE mg/dL   Hgb urine dipstick NEGATIVE NEGATIVE   Bilirubin Urine NEGATIVE NEGATIVE   Ketones, ur NEGATIVE NEGATIVE mg/dL   Protein, ur 30 (A) NEGATIVE mg/dL   Nitrite NEGATIVE NEGATIVE   Leukocytes,Ua TRACE (A) NEGATIVE   RBC / HPF 0-5 0 - 5  RBC/hpf   WBC, UA 21-50 0 - 5 WBC/hpf    Comment:        Reflex urine culture not performed if WBC <=10, OR if Squamous epithelial cells >5. If Squamous epithelial cells >5 suggest recollection.    Bacteria, UA RARE (A) NONE SEEN   Squamous Epithelial / HPF 0-5 0 - 5 /HPF    Comment: Performed at [facility], [address], [place], [place] [address]  C Difficile Quick Screen w PCR reflex     Status: None   Collection Time: 03/19/24  6:30 AM   Specimen: STOOL  Result Value Ref Range   C Diff antigen NEGATIVE NEGATIVE   C Diff toxin NEGATIVE NEGATIVE   C Diff  interpretation No C. difficile detected.     Comment: Performed at [facility], [address] [place], [place] [address]  Gastrointestinal Panel by PCR , Stool     Status: Abnormal   Collection Time: 03/19/24  6:30 AM   Specimen: STOOL  Result Value Ref Range   Campylobacter species NOT DETECTED NOT DETECTED   Plesimonas shigelloides NOT DETECTED NOT DETECTED   Salmonella species NOT DETECTED NOT DETECTED   Yersinia enterocolitica NOT DETECTED NOT DETECTED   Vibrio species NOT DETECTED NOT DETECTED   Vibrio cholerae NOT DETECTED NOT DETECTED   Enteroaggregative E coli (EAEC) NOT DETECTED NOT DETECTED   Enteropathogenic E coli (EPEC) NOT DETECTED NOT DETECTED   Enterotoxigenic E coli (ETEC) NOT DETECTED NOT DETECTED   Shiga like toxin producing E coli (STEC) NOT DETECTED NOT DETECTED   Shigella/Enteroinvasive E coli (EIEC) NOT DETECTED NOT DETECTED   Cryptosporidium NOT DETECTED NOT DETECTED   Cyclospora cayetanensis NOT DETECTED NOT DETECTED   Entamoeba histolytica NOT DETECTED NOT DETECTED   Giardia lamblia NOT DETECTED NOT DETECTED   Adenovirus F40/41 NOT DETECTED NOT DETECTED   Astrovirus DETECTED (A) NOT DETECTED   Norovirus GI/GII NOT DETECTED NOT DETECTED   Rotavirus A NOT DETECTED NOT DETECTED   Sapovirus (I, II, IV, and V) NOT DETECTED NOT DETECTED    Comment: Performed at [facility], [address] [place], [place] [address]  Urine Culture     Status: Abnormal   Collection Time: 03/19/24  6:30 AM   Specimen: Urine, Random  Result Value Ref Range   Specimen Description      URINE, RANDOM Performed at [facility], [address] [place], [place] [address]    Special Requests      NONE Reflexed from [id number] Performed at [facility], [address], [place], [place] [address]    Culture (A)     <10,000 COLONIES/mL INSIGNIFICANT GROWTH Performed at [facility], [address] [place], [place] [address]    Report Status 03/20/2024 FINAL   PTH, intact and calcium     Status: Abnormal   Collection Time: 03/30/24 10:55 AM  Result Value Ref Range   PTH 76 (H) 15 - 65 pg/mL   PTH Interp Comment     Comment: Interpretation                 Intact PTH    Calcium                                 (pg/mL)      (mg/dL) Normal                          15 - 65     8.6 - 10.2 Primary Hyperparathyroidism         >65          >10.2 Secondary Hyperparathyroidism       >65          <10.2 Non-Parathyroid Hypercalcemia       <65          >10.2 Hypoparathyroidism                  <15          < 8.6 Non-Parathyroid Hypocalcemia    15 - 65          < 8.6  Comprehensive metabolic panel     Status: Abnormal   Collection Time: 03/30/24 10:55 AM  Result Value Ref Range   Glucose 99 70 - 99 mg/dL   BUN 22 8 - 27 mg/dL   Creatinine, Ser 8.76 (H) 0.57 - 1.00 mg/dL   eGFR 48 (L) >40 fO/fpw/8.26   BUN/Creatinine Ratio 18 12 - 28   Sodium 142 134 - 144 mmol/L   Potassium 4.7 3.5 - 5.2 mmol/L   Chloride 105 96 - 106 mmol/L   CO2 21 20 - 29 mmol/L   Calcium 11.2 (H) 8.7 - 10.3 mg/dL   Total Protein 5.9 (L) 6.0 - 8.5 g/dL   Albumin 4.2 3.9 - 4.9 g/dL   Globulin, Total 1.7 1.5 - 4.5 g/dL   Bilirubin Total 0.4 0.0 - 1.2 mg/dL   Alkaline Phosphatase 70 44 - 121 IU/L   AST 18 0 - 40 IU/L   ALT 17 0 - 32 IU/L  VITAMIN D  25 Hydroxy (Vit-D Deficiency, Fractures)     Status: None   Collection  Time: 03/30/24 10:55 AM  Result Value Ref Range   Vit D, 25-Hydroxy 48.4 30.0 - 100.0 ng/mL    Comment: Vitamin D  deficiency has been defined by the Institute of Medicine and an Endocrine Society practice guideline as a level of serum 25-OH vitamin D  less than 20 ng/mL (1,2). The Endocrine Society went on to further define vitamin D  insufficiency as a level between 21 and 29 ng/mL (2). 1. IOM (Institute of Medicine). 2010. Dietary reference    intakes for calcium and D. [place]: The    [employer]. 2. Holick MF, Binkley [place], Bischoff-Ferrari HA, et al.    Evaluation, treatment, and prevention of vitamin D     deficiency: an Endocrine Society clinical practice    guideline. JCEM. 2011 Jul; 96(7):1911-30.   Urinalysis, Routine w reflex microscopic     Status: Abnormal   Collection Time: 04/01/24  9:44 AM  Result Value Ref Range   Specific Gravity, UA 1.025 1.005 - 1.030   pH, UA 5.5 5.0 - 7.5   Color, UA Yellow Yellow   Appearance Ur Clear Clear   Leukocytes,UA Negative Negative   Protein,UA Negative Negative/Trace   Glucose, UA Negative Negative   Ketones, UA Negative Negative   RBC, UA Trace (A) Negative   Bilirubin, UA Negative Negative   Urobilinogen, Ur 0.2 0.2 - 1.0 mg/dL   Nitrite, UA Negative Negative   Microscopic Examination See below:     Comment: Microscopic was indicated and was performed.  Microscopic Examination     Status: Abnormal   Collection Time: 04/01/24  9:44 AM   Urine  Result Value Ref Range   WBC, UA 0-5 0 - 5 /hpf   RBC, Urine 3-10 (A) 0 - 2 /hpf   Epithelial Cells (non renal) 0-10 0 - 10 /hpf   Crystals Present (A) N/A   Crystal Type Calcium Oxalate N/A   Bacteria, UA None seen None seen/Few      Assessment: 1.  Elevated PTH with mild hypercalcemia  on the background of CKD  2.  Hypercalcemia of 10.5  Plan: Patient continues to have mild hypercalcemia currently at 11.2 associated with higher PTH of 76.   PTH RP was  undetectable in May 2024  -She has osteopenia, and remote past history of nephrolithiasis. -Denies history of fragility fractures. No abdominal pain, no major mood disorders, no bone pain.  - I discussed with the patient and her  sister about the physiology of calcium and parathyroid hormone, and possible  effects of  increased PTH/ Calcium , including kidney stones, cardiac dysrhythmias, osteoporosis, abdominal pain, etc.   -It was not practical in her case to do 24-hour urine calcium measurement to rule out FHH.    The cause of her mild hypercalcemia associated with mild elevation of PTH is not settled yet.    She will be considered for alternative workup to recheck the diagnosis.  She is a good candidate for parathyroid scan to be done in the next several days at [facility].  She will return in 3 weeks to discuss her results. This scan localizes a parathyroid adenoma, she will be considered for surgical treatment. Her DEXA scan was reviewed showing osteopenia of hips. She is advised to continue Zetia 10 mg p.o. daily for hyperlipidemia, Janumet 50/1000 mg p.o. twice daily for type 2 diabetes.  She is advised to maintain close follow-up with her PMD.   I spent  21  minutes in the care of the patient today including review of labs from Thyroid Function, CMP, and other relevant labs ; imaging/biopsy records (current and previous including abstractions from other facilities); face-to-face time discussing  her lab results and symptoms, medications doses, her options of short and long term treatment based on the latest standards of care / guidelines;   and documenting the encounter.  Inocente Au  participated in the discussions, expressed understanding, and voiced agreement with the above plans.  All questions were answered to her satisfaction. she is encouraged to contact clinic should she have any questions or concerns prior to her return visit.    - Return in about 3 weeks (around  04/26/2024), or with parathyroid scan result.   Ranny Earl, MD [facility] [address] [place], [place] [address] Phone: [phone]  Fax: [phone]    This note was partially dictated with voice recognition software. Similar sounding words can be transcribed inadequately or may not  be corrected upon review.  04/05/2024, 12:40 PM

## 2024-04-12 ENCOUNTER — Ambulatory Visit (INDEPENDENT_AMBULATORY_CARE_PROVIDER_SITE_OTHER): Admitting: Gastroenterology

## 2024-04-12 ENCOUNTER — Encounter (INDEPENDENT_AMBULATORY_CARE_PROVIDER_SITE_OTHER): Payer: Self-pay | Admitting: Gastroenterology

## 2024-04-12 VITALS — BP 122/58 | HR 66 | Temp 97.5°F | Ht 65.0 in | Wt 192.9 lb

## 2024-04-12 DIAGNOSIS — A084 Viral intestinal infection, unspecified: Secondary | ICD-10-CM

## 2024-04-12 DIAGNOSIS — A0832 Astrovirus enteritis: Secondary | ICD-10-CM | POA: Diagnosis not present

## 2024-04-12 NOTE — Progress Notes (Signed)
 Toribio Fortune, M.D. Gastroenterology & Hepatology [facility] [address] [place], [place] [address]  Primary Care Physician: Francis Alan CROME, FNP [address] [place] [address]  I will communicate my assessment and recommendations to the referring MD via EMR.  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  The patient was last seen on 03/22/2024. At that time, the patient was advised to implement a BRAT diet and was continued with conservative measures with Zofran  and Bentyl .  Patient reports feeling better than before. She is eating more solid food. She is still having nausea, but no vomiting, for which she takes Zofran . She reports feeling some soreness in her lower abdomen. She ishaving 2 Bms per day with normal consistency, no melena or hematochezia.  The patient denies having any fever, chills, hematochezia, melena, hematemesis, jaundice, pruritus or weight loss.  She reports that she had a sinus and ear infection, was given Augmentin and now is taking erithromycin, which she will finish the antibiotic today.  Last Colonoscopy:04/02/22 - Three 3 to 5 mm polyps in the ascending colon and in the cecum - One 2 mm polyp in the ascending colon - Two 4 to 5 mm polyps in the transverse colon  - Diverticulosis in the sigmoid colon. Biopsied. - Congested mucosa in the rectum. normal colon Biopsied. - Non-bleeding external and internal hemorrhoids. - Hemorrhoids found on perianal exam. (6 TAs, normal colonic biopsies)   Last Endoscopy:10/26/21 normal esophagus  - Multiple gastric  polyps. Fundic gland polyps - rest of the examined stomach was normal. Biopsied-normal - Normal duodenum. Biopsied-normal   ERCP 03/11/2023 by Dr. Ethel. CBD measuring 11 mm and there was no presence of filling defects in the bile duct, with normal intrahepatic ducts. The biliary tree was swept with a 12 mm balloon and some sludge was removed.   Past Medical History: Past Medical History:  Diagnosis Date   Basal cell carcinoma (BCC) of skin of face 10/2023   left side   Chronic kidney disease    Diabetes mellitus without complication (HCC)    H/O blood clots    Headache    History of hypertension    Hyperlipidemia    Lupus    Mycobacterial disease    Sicca (HCC)     Past Surgical History: Past Surgical History:  Procedure Laterality Date   ABDOMINAL HYSTERECTOMY     BIOPSY  10/26/2021   Procedure: BIOPSY;  Surgeon: Fortune Angelia Toribio, MD;  [facility]: [facility];  Service: Gastroenterology;;   BIOPSY  04/02/2022   Procedure: BIOPSY;  Surgeon: Fortune Angelia Toribio, MD;  [facility]: [facility];  Service: Gastroenterology;;   CHOLECYSTECTOMY N/A 03/07/2023   Procedure: LAPAROSCOPIC CHOLECYSTECTOMY;  Surgeon: Evonnie Dorothyann LABOR, DO;  Location: [facility];  Service: General;  Laterality: N/A;   COLONOSCOPY  05/26/2015   Dr donnel, mild diverticulosis in sigmoid colon, biosies taken from [facility] and sigmoid colon-negative   COLONOSCOPY WITH PROPOFOL  N/A 04/02/2022   Procedure: COLONOSCOPY WITH PROPOFOL ;  Surgeon: Fortune Angelia Toribio, MD;  [facility]: [facility];  Service: Gastroenterology;  Laterality: N/A;  815   CYSTOSCOPY WITH RETROGRADE PYELOGRAM, URETEROSCOPY AND STENT PLACEMENT Right 01/30/2023  Procedure: CYSTOSCOPY WITH RETROGRADE PYELOGRAM, URETEROSCOPY AND STENT PLACEMENT;  Surgeon: Sherrilee Belvie CROME, MD;  Location: [facility];  Service: Urology;  Laterality: Right;   ESOPHAGOGASTRODUODENOSCOPY (EGD) WITH PROPOFOL  N/A 10/26/2021   Procedure:  ESOPHAGOGASTRODUODENOSCOPY (EGD) WITH PROPOFOL ;  Surgeon: Eartha Angelia Sieving, MD;  [facility]: [facility];  Service: Gastroenterology;  Laterality: N/A;  1045   EXTRACORPOREAL SHOCK WAVE LITHOTRIPSY Right 12/03/2022   Procedure: EXTRACORPOREAL SHOCK WAVE LITHOTRIPSY (ESWL);  Surgeon: Sherrilee Belvie CROME, MD;  Location: [facility];  Service: Urology;  Laterality: Right;   HEMOSTASIS CLIP PLACEMENT  10/26/2021   Procedure: HEMOSTASIS CLIP PLACEMENT;  Surgeon: Eartha Angelia Sieving, MD;  [facility];  Service: Gastroenterology;;   HOLMIUM LASER APPLICATION Right 01/30/2023   Procedure: HOLMIUM LASER APPLICATION;  Surgeon: Sherrilee Belvie CROME, MD;  Location: [facility];  Service: Urology;  Laterality: Right;   HOT HEMOSTASIS  10/26/2021   Procedure: HOT HEMOSTASIS (ARGON PLASMA COAGULATION/BICAP);  Surgeon: Eartha Angelia, Sieving, MD;  [facility]: [facility];  Service: Gastroenterology;;   hystorectomy     POLYPECTOMY  10/26/2021   Procedure: POLYPECTOMY;  Surgeon: Eartha Angelia Sieving, MD;  [facility];  Service: Gastroenterology;;   POLYPECTOMY  04/02/2022   Procedure: POLYPECTOMY;  Surgeon: Eartha Angelia, Sieving, MD;  [facility];  Service: Gastroenterology;;   SINUS SURGERY WITH INSTATRAK  2015   SKIN BIOPSY      Family History: Family History  Problem Relation Age of Onset   Hypertension Mother    Diabetes Mother    Kidney disease Mother    Heart disease Mother    High Cholesterol Mother    Lung cancer Mother    Hypertension Father    Diabetes Father    Kidney disease Father    Heart disease Father     Social History: Social History   Tobacco Use  Smoking Status Never   Passive exposure: Never  Smokeless Tobacco Never   Social History   Substance and Sexual Activity  Alcohol  Use No   Social History   Substance and Sexual Activity  Drug Use No    Allergies: Allergies  Allergen Reactions   Sulfa Antibiotics  Hives   Caffeine Other (See Comments)    Bladder infections   Codeine Nausea And Vomiting    Medications: Current Outpatient Medications  Medication Sig Dispense Refill   acetaminophen  (TYLENOL ) 500 MG tablet Take 2 tablets (1,000 mg total) by mouth every 8 (eight) hours as needed for mild pain, fever or headache.     apixaban  (ELIQUIS ) 5 MG TABS tablet Take 1 tablet (5 mg total) by mouth 2 (two) times daily. To be resume at the moment of discharge and after all procedures completed.     azelastine (ASTELIN) 0.1 % nasal spray Place 1 spray into both nostrils daily.     dicyclomine  (BENTYL ) 10 MG capsule Take 1 capsule (10 mg total) by mouth every 8 (eight) hours as needed (abdominal pain or diarrhea). 90 capsule 0   ezetimibe (ZETIA) 10 MG tablet Take 10 mg by mouth daily.     fenofibrate (TRICOR) 145 MG tablet Take 145 mg by mouth every evening.     ferrous sulfate  325 (65 FE) MG EC tablet Take 1 tablet (325 mg total) by mouth daily with breakfast. Resume at time of discharge     furosemide  (LASIX ) 20 MG tablet TAKE (1) TABLET BY MOUTH ONCE DAILY AS NEEDED. 30 tablet 0   HYDROcodone  Bit-Homatrop MBr (HYCODAN PO) Take  by mouth as needed.     hydrocortisone  (ANUSOL -HC) 2.5 % rectal cream Place 1 Application rectally 4 (four) times daily. Use four times per day x10 days then PRN thereafter 56 g 1   levocetirizine (XYZAL) 5 MG tablet Take 5 mg by mouth every evening.     loperamide (IMODIUM A-D) 2 MG tablet Take 2 mg by mouth 2 (two) times daily as needed for diarrhea or loose stools.     metFORMIN (GLUCOPHAGE) 1000 MG tablet Take 1,000 mg by mouth 2 (two) times daily.     Multiple Vitamin (MULTIVITAMIN) tablet Take 1 tablet by mouth in the morning.     ondansetron  (ZOFRAN ) 4 MG tablet Take 1 tablet (4 mg total) by mouth every 8 (eight) hours as needed for nausea or vomiting. 4 tablet 0   pantoprazole  (PROTONIX ) 40 MG tablet TAKE ONE TABLET BY MOUTH EVERY DAY 90 tablet 2   pioglitazone (ACTOS)  15 MG tablet Take 15 mg by mouth daily.     polyvinyl alcohol  (LIQUIFILM TEARS) 1.4 % ophthalmic solution Place 1 drop into both eyes as needed for dry eyes.     predniSONE  5 MG TBEC Take 5 mg by mouth daily with breakfast.     amoxicillin-clavulanate (AUGMENTIN) 875-125 MG tablet Take 1 tablet by mouth 2 (two) times daily. (Patient not taking: Reported on 04/12/2024)     No current facility-administered medications for this visit.    Review of Systems: GENERAL: negative for malaise, night sweats HEENT: No changes in hearing or vision, no nose bleeds or other nasal problems. NECK: Negative for lumps, goiter, pain and significant neck swelling RESPIRATORY: Negative for cough, wheezing CARDIOVASCULAR: Negative for chest pain, leg swelling, palpitations, orthopnea GI: SEE HPI MUSCULOSKELETAL: Negative for joint pain or swelling, back pain, and muscle pain. SKIN: Negative for lesions, rash PSYCH: Negative for sleep disturbance, mood disorder and recent psychosocial stressors. HEMATOLOGY Negative for prolonged bleeding, bruising easily, and swollen nodes. ENDOCRINE: Negative for cold or heat intolerance, polyuria, polydipsia and goiter. NEURO: negative for tremor, gait imbalance, syncope and seizures. The remainder of the review of systems is noncontributory.   Physical Exam: BP (!) 122/58 (BP Location: Left Arm, Patient Position: Sitting, Cuff Size: Large)   Pulse 66   Temp (!) 97.5 F (36.4 C) (Temporal)   Ht 5' 5 (1.651 m)   Wt 192 lb 14.4 oz (87.5 kg)   BMI 32.10 kg/m  GENERAL: The patient is AO x3, in no acute distress. HEENT: Head is normocephalic and atraumatic. EOMI are intact. Mouth is well hydrated and without lesions. NECK: Supple. No masses LUNGS: Clear to auscultation. No presence of rhonchi/wheezing/rales. Adequate chest expansion HEART: RRR, normal s1 and s2. ABDOMEN: Soft, nontender, no guarding, no peritoneal signs, and nondistended. BS +. No masses. EXTREMITIES:  Without any cyanosis, clubbing, rash, lesions or edema. NEUROLOGIC: AOx3, no focal motor deficit. SKIN: no jaundice, no rashes  Imaging/Labs: as above  I personally reviewed and interpreted the available labs, imaging and endoscopic files.  Impression and Plan: Joy Leach is a 68 y.o. 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  The patient has presented an episode of acute gastroenteritis that seems to be slowly improving with conservative management.  She is feeling much better today.  I advised her to slowly advance her diet as tolerated, she can continue with  Zofran  and dicyclomine  as needed for symptom control.  -Continue advancing diet as tolerated -Continue Zofran  as needed for nausea/vomiting  All questions were answered.      Toribio Fortune, MD Gastroenterology and Hepatology [facility]

## 2024-04-12 NOTE — Patient Instructions (Signed)
 Continue advancing diet as tolerated Continue Zofran  as needed for nausea/vomiting

## 2024-04-19 ENCOUNTER — Ambulatory Visit (HOSPITAL_COMMUNITY)
Admission: RE | Admit: 2024-04-19 | Discharge: 2024-04-19 | Disposition: A | Discharge status: Home / Self Care | Source: Ambulatory Visit | Attending: "Endocrinology | Admitting: "Endocrinology

## 2024-04-19 DIAGNOSIS — E212 Other hyperparathyroidism: Secondary | ICD-10-CM | POA: Diagnosis present

## 2024-04-19 MED ORDER — TECHNETIUM TC 99M SESTAMIBI - CARDIOLITE
25.0000 | Freq: Once | INTRAVENOUS | Status: AC | PRN
  Administered 2024-04-19: 24.9 via INTRAVENOUS

## 2024-04-27 ENCOUNTER — Encounter: Payer: Self-pay | Admitting: "Endocrinology

## 2024-04-27 ENCOUNTER — Telehealth: Payer: Self-pay | Admitting: "Endocrinology

## 2024-04-27 ENCOUNTER — Ambulatory Visit: Admitting: "Endocrinology

## 2024-04-27 VITALS — BP 104/68 | HR 72 | Ht 65.0 in | Wt 192.0 lb

## 2024-04-27 DIAGNOSIS — E119 Type 2 diabetes mellitus without complications: Secondary | ICD-10-CM | POA: Diagnosis not present

## 2024-04-27 DIAGNOSIS — E212 Other hyperparathyroidism: Secondary | ICD-10-CM

## 2024-04-27 DIAGNOSIS — E782 Mixed hyperlipidemia: Secondary | ICD-10-CM

## 2024-04-27 DIAGNOSIS — Z7984 Long term (current) use of oral hypoglycemic drugs: Secondary | ICD-10-CM

## 2024-04-27 NOTE — Telephone Encounter (Signed)
 Pt will call once she gets her surgery date

## 2024-04-27 NOTE — Progress Notes (Signed)
 04/27/2024, 12:36 PM  Endocrinology follow-up note  Joy Leach is a 68 y.o.-year-old female, referred by her  Francis Alan CROME, FNP  , for evaluation for hypercalcemia/hyperparathyroidism.   Past Medical History:  Diagnosis Date   Basal cell carcinoma (BCC) of skin of face 10/2023   left side   Chronic kidney disease    Diabetes mellitus without complication (HCC)    H/O blood clots    Headache    History of hypertension    Hyperlipidemia    Lupus    Mycobacterial disease    Sicca (HCC)     Past Surgical History:  Procedure Laterality Date   ABDOMINAL HYSTERECTOMY     BIOPSY  10/26/2021   Procedure: BIOPSY;  Surgeon: Eartha Angelia Sieving, MD;  [facility];  Service: Gastroenterology;;   BIOPSY  04/02/2022   Procedure: BIOPSY;  Surgeon: Eartha Angelia Sieving, MD;  [facility];  Service: Gastroenterology;;   CHOLECYSTECTOMY N/A 03/07/2023   Procedure: LAPAROSCOPIC CHOLECYSTECTOMY;  Surgeon: Evonnie Dorothyann LABOR, DO;  Location: [facility];  Service: General;  Laterality: N/A;   COLONOSCOPY  05/26/2015   Dr donnel, mild diverticulosis in sigmoid colon, biosies taken from [facility] and sigmoid colon-negative   COLONOSCOPY WITH PROPOFOL  N/A 04/02/2022   Procedure: COLONOSCOPY WITH PROPOFOL ;  Surgeon: Eartha Angelia Sieving, MD;  [facility];  Service: Gastroenterology;  Laterality: N/A;  815   CYSTOSCOPY WITH RETROGRADE PYELOGRAM, URETEROSCOPY AND STENT PLACEMENT Right 01/30/2023   Procedure: CYSTOSCOPY WITH RETROGRADE PYELOGRAM, URETEROSCOPY AND STENT PLACEMENT;  Surgeon: Sherrilee Belvie CROME, MD;  Location: [facility];  Service: Urology;  Laterality: Right;   ESOPHAGOGASTRODUODENOSCOPY (EGD) WITH PROPOFOL  N/A 10/26/2021   Procedure: ESOPHAGOGASTRODUODENOSCOPY (EGD) WITH PROPOFOL ;  Surgeon: Eartha Angelia Sieving, MD;  [facility];  Service: Gastroenterology;  Laterality:  N/A;  1045   EXTRACORPOREAL SHOCK WAVE LITHOTRIPSY Right 12/03/2022   Procedure: EXTRACORPOREAL SHOCK WAVE LITHOTRIPSY (ESWL);  Surgeon: Sherrilee Belvie CROME, MD;  Location: [facility];  Service: Urology;  Laterality: Right;   HEMOSTASIS CLIP PLACEMENT  10/26/2021   Procedure: HEMOSTASIS CLIP PLACEMENT;  Surgeon: Eartha Angelia Sieving, MD;  [facility];  Service: Gastroenterology;;   HOLMIUM LASER APPLICATION Right 01/30/2023   Procedure: HOLMIUM LASER APPLICATION;  Surgeon: Sherrilee Belvie CROME, MD;  Location: [facility];  Service: Urology;  Laterality: Right;   HOT HEMOSTASIS  10/26/2021   Procedure: HOT HEMOSTASIS (ARGON PLASMA COAGULATION/BICAP);  Surgeon: Eartha Angelia, Sieving, MD;  [facility];  Service: Gastroenterology;;   hystorectomy     POLYPECTOMY  10/26/2021   Procedure: POLYPECTOMY;  Surgeon: Eartha Angelia Sieving, MD;  [facility];  Service: Gastroenterology;;   POLYPECTOMY  04/02/2022   Procedure: POLYPECTOMY;  Surgeon: Eartha Angelia Sieving, MD;  [facility];  Service: Gastroenterology;;   SINUS SURGERY WITH INSTATRAK  2015   SKIN BIOPSY      Social History   Tobacco Use   Smoking status: Never    Passive exposure: Never   Smokeless tobacco: Never  Vaping Use   Vaping status: Never Used  Substance Use Topics   Alcohol  use: No   Drug  use: No    Family History  Problem Relation Age of Onset   Hypertension Mother    Diabetes Mother    Kidney disease Mother    Heart disease Mother    High Cholesterol Mother    Lung cancer Mother    Hypertension Father    Diabetes Father    Kidney disease Father    Heart disease Father     Outpatient Encounter Medications as of 04/27/2024  Medication Sig   acetaminophen  (TYLENOL ) 500 MG tablet Take 2 tablets (1,000 mg total) by mouth every 8 (eight) hours as needed for mild pain, fever or headache.   amoxicillin-clavulanate (AUGMENTIN) 875-125 MG tablet Take 1 tablet by  mouth 2 (two) times daily. (Patient not taking: Reported on 04/12/2024)   apixaban  (ELIQUIS ) 5 MG TABS tablet Take 1 tablet (5 mg total) by mouth 2 (two) times daily. To be resume at the moment of discharge and after all procedures completed.   azelastine (ASTELIN) 0.1 % nasal spray Place 1 spray into both nostrils daily.   dicyclomine  (BENTYL ) 10 MG capsule Take 1 capsule (10 mg total) by mouth every 8 (eight) hours as needed (abdominal pain or diarrhea).   ezetimibe (ZETIA) 10 MG tablet Take 10 mg by mouth daily.   fenofibrate (TRICOR) 145 MG tablet Take 145 mg by mouth every evening.   ferrous sulfate  325 (65 FE) MG EC tablet Take 1 tablet (325 mg total) by mouth daily with breakfast. Resume at time of discharge   furosemide  (LASIX ) 20 MG tablet TAKE (1) TABLET BY MOUTH ONCE DAILY AS NEEDED.   HYDROcodone  Bit-Homatrop MBr (HYCODAN PO) Take by mouth as needed.   hydrocortisone  (ANUSOL -HC) 2.5 % rectal cream Place 1 Application rectally 4 (four) times daily. Use four times per day x10 days then PRN thereafter   levocetirizine (XYZAL) 5 MG tablet Take 5 mg by mouth every evening.   loperamide (IMODIUM A-D) 2 MG tablet Take 2 mg by mouth 2 (two) times daily as needed for diarrhea or loose stools.   metFORMIN (GLUCOPHAGE) 1000 MG tablet Take 1,000 mg by mouth 2 (two) times daily.   Multiple Vitamin (MULTIVITAMIN) tablet Take 1 tablet by mouth in the morning.   ondansetron  (ZOFRAN ) 4 MG tablet Take 1 tablet (4 mg total) by mouth every 8 (eight) hours as needed for nausea or vomiting.   pantoprazole  (PROTONIX ) 40 MG tablet TAKE ONE TABLET BY MOUTH EVERY DAY   pioglitazone (ACTOS) 15 MG tablet Take 15 mg by mouth daily.   polyvinyl alcohol  (LIQUIFILM TEARS) 1.4 % ophthalmic solution Place 1 drop into both eyes as needed for dry eyes.   predniSONE  5 MG TBEC Take 5 mg by mouth daily with breakfast.   No facility-administered encounter medications on file as of 04/27/2024.    Allergies  Allergen  Reactions   Sulfa Antibiotics Hives   Caffeine Other (See Comments)    Bladder infections   Codeine Nausea And Vomiting     HPI  Joy Leach was diagnosed with elevated PTH of 88 on December 06, 2022 associated with mild hypercalcemia.   She is returning to follow-up with sestamibi parathyroid  scan after she was observed to have persistent hypercalcemia, worsening progressively.  She could not do 24-hour urine collection due to incontinence.   Her parathyroid  scan showed possible parathyroid  adenoma on posterior aspect of lower pole of the right lobe of her thyroid. Her most recent labs showed  calcium at 11.2, and increasing PTH at 76.  She has a medical history of lupus with nephropathy on regular nephrology follow-up.  In recent weeks, she had dealt with diarrhea for which she was given assessment and antibiotics.  After a period of improvement, she started to have it again.  She is planning to see her PCP today, I will also have a GI doctor that she plans to call.    Patient has no previously known history of parathyroid , pituitary, adrenal dysfunctions; no family history of such dysfunctions. She did have a recent bone density at her rheumatologist.  T-score of the hips were -2.0 consistent with osteopenia.  She is accompanied by her sister to clinic.    She is on maintenance vitamin D  currently replete at 48.  No prior history of fragility fractures or falls.  She is unsure of her remote past history of nephrolithiasis. - She does not report any height loss. She does not control bladder, wears pads and reporting urge incontinence.   she is not on HCTZ or other thiazide therapy.   She has type 2 diabetes on Actos, Janumet.  She also has hyperlipidemia on Tricor. she is not on calcium supplements,  she eats dairy and green, leafy, vegetables on average amounts.  she does not have a family history of hypercalcemia, pituitary tumors, thyroid cancer, or osteoporosis.     ROS:  Constitutional: + Minimally fluctuating body weight,  no fatigue, no subjective hyperthermia, no subjective hypothermia Eyes: no blurry vision, no xerophthalmia   PE: BP 104/68   Pulse 72   Ht 5' 5 (1.651 m)   Wt 192 lb (87.1 kg)   BMI 31.95 kg/m , Body mass index is 31.95 kg/m. Wt Readings from Last 3 Encounters:  04/27/24 192 lb (87.1 kg)  04/12/24 192 lb 14.4 oz (87.5 kg)  04/05/24 191 lb 6.4 oz (86.8 kg)    Constitutional: + BMI of 30.19, not in acute distress, normal state of mind Eyes: PERRLA, EOMI, no exophthalmos ENT: moist mucous membranes, no gross thyromegaly, no gross cervical lymphadenopathy    CMP ( most recent) CMP     Component Value Date/Time   NA 142 03/30/2024 1055   K 4.7 03/30/2024 1055   CL 105 03/30/2024 1055   CO2 21 03/30/2024 1055   GLUCOSE 99 03/30/2024 1055   GLUCOSE 169 (H) 03/19/2024 0323   BUN 22 03/30/2024 1055   CREATININE 1.23 (H) 03/30/2024 1055   CREATININE 1.06 (H) 08/20/2016 1422   CALCIUM 11.2 (H) 03/30/2024 1055   PROT 5.9 (L) 03/30/2024 1055   ALBUMIN 4.2 03/30/2024 1055   AST 18 03/30/2024 1055   ALT 17 03/30/2024 1055   ALKPHOS 70 03/30/2024 1055   BILITOT 0.4 03/30/2024 1055   GFRNONAA 36 (L) 03/19/2024 0323   GFRAA 73 03/11/2018 1034      Lab Results  Component Value Date   TSH 1.13 07/12/2016    Recent Results (from the past 2160 hours)  CBC with Differential     Status: Abnormal   Collection Time: 03/19/24  3:23 AM  Result Value Ref Range   WBC 11.3 (H) 4.0 - 10.5 K/uL   RBC 4.63 3.87 - 5.11 MIL/uL   Hemoglobin 13.1 12.0 - 15.0 g/dL   HCT 58.7 63.9 - 53.9 %   MCV 89.0 80.0 - 100.0 fL   MCH 28.3 26.0 - 34.0 pg   MCHC 31.8 30.0 - 36.0 g/dL   RDW 85.4 88.4 - 84.4 %   Platelets 234 150 - 400 K/uL  nRBC 0.0 0.0 - 0.2 %   Neutrophils Relative % 90 %   Neutro Abs 10.1 (H) 1.7 - 7.7 K/uL   Lymphocytes Relative 4 %   Lymphs Abs 0.5 (L) 0.7 - 4.0 K/uL   Monocytes Relative 5 %   Monocytes  Absolute 0.5 0.1 - 1.0 K/uL   Eosinophils Relative 0 %   Eosinophils Absolute 0.1 0.0 - 0.5 K/uL   Basophils Relative 0 %   Basophils Absolute 0.0 0.0 - 0.1 K/uL   Immature Granulocytes 1 %   Abs Immature Granulocytes 0.06 0.00 - 0.07 K/uL    Comment: Performed at [facility], [address] [place], [place] [address]  Comprehensive metabolic panel     Status: Abnormal   Collection Time: 03/19/24  3:23 AM  Result Value Ref Range   Sodium 138 135 - 145 mmol/L   Potassium 3.8 3.5 - 5.1 mmol/L   Chloride 103 98 - 111 mmol/L   CO2 26 22 - 32 mmol/L   Glucose, Bld 169 (H) 70 - 99 mg/dL    Comment: Glucose reference range applies only to samples taken after fasting for at least 8 hours.   BUN 22 8 - 23 mg/dL   Creatinine, Ser 8.42 (H) 0.44 - 1.00 mg/dL   Calcium 89.3 (H) 8.9 - 10.3 mg/dL   Total Protein 6.8 6.5 - 8.1 g/dL   Albumin 4.1 3.5 - 5.0 g/dL   AST 28 15 - 41 U/L   ALT 18 0 - 44 U/L   Alkaline Phosphatase 55 38 - 126 U/L   Total Bilirubin 0.9 0.0 - 1.2 mg/dL   GFR, Estimated 36 (L) >60 mL/min    Comment: (NOTE) Calculated using the CKD-EPI Creatinine Equation (2021)    Anion gap 9 5 - 15    Comment: Performed at [facility], [address] [place], [place] [address]  Lipase, blood     Status: None   Collection Time: 03/19/24  3:23 AM  Result Value Ref Range   Lipase 33 11 - 51 U/L    Comment: Performed at [facility], [address] [place], [place] [address]  Urinalysis, w/ Reflex to Culture (Infection Suspected) -Urine, Clean Catch     Status: Abnormal   Collection Time: 03/19/24  6:30 AM  Result Value Ref Range   Specimen Source URINE, CLEAN CATCH    Color, Urine YELLOW YELLOW   APPearance HAZY (A) CLEAR   Specific Gravity, Urine 1.018 1.005 - 1.030   pH 8.0 5.0 - 8.0   Glucose, UA NEGATIVE NEGATIVE mg/dL   Hgb urine dipstick NEGATIVE NEGATIVE   Bilirubin Urine NEGATIVE NEGATIVE   Ketones, ur NEGATIVE NEGATIVE mg/dL   Protein, ur 30 (A) NEGATIVE mg/dL    Nitrite NEGATIVE NEGATIVE   Leukocytes,Ua TRACE (A) NEGATIVE   RBC / HPF 0-5 0 - 5 RBC/hpf   WBC, UA 21-50 0 - 5 WBC/hpf    Comment:        Reflex urine culture not performed if WBC <=10, OR if Squamous epithelial cells >5. If Squamous epithelial cells >5 suggest recollection.    Bacteria, UA RARE (A) NONE SEEN   Squamous Epithelial / HPF 0-5 0 - 5 /HPF    Comment: Performed at [facility], [address], [place], [place] [address]  C Difficile Quick Screen w PCR reflex     Status: None   Collection Time: 03/19/24  6:30 AM   Specimen: STOOL  Result Value Ref Range   C  Diff antigen NEGATIVE NEGATIVE   C Diff toxin NEGATIVE NEGATIVE   C Diff interpretation No C. difficile detected.     Comment: Performed at [facility], [address] [place], [place] [address]  Gastrointestinal Panel by PCR , Stool     Status: Abnormal   Collection Time: 03/19/24  6:30 AM   Specimen: STOOL  Result Value Ref Range   Campylobacter species NOT DETECTED NOT DETECTED   Plesimonas shigelloides NOT DETECTED NOT DETECTED   Salmonella species NOT DETECTED NOT DETECTED   Yersinia enterocolitica NOT DETECTED NOT DETECTED   Vibrio species NOT DETECTED NOT DETECTED   Vibrio cholerae NOT DETECTED NOT DETECTED   Enteroaggregative E coli (EAEC) NOT DETECTED NOT DETECTED   Enteropathogenic E coli (EPEC) NOT DETECTED NOT DETECTED   Enterotoxigenic E coli (ETEC) NOT DETECTED NOT DETECTED   Shiga like toxin producing E coli (STEC) NOT DETECTED NOT DETECTED   Shigella/Enteroinvasive E coli (EIEC) NOT DETECTED NOT DETECTED   Cryptosporidium NOT DETECTED NOT DETECTED   Cyclospora cayetanensis NOT DETECTED NOT DETECTED   Entamoeba histolytica NOT DETECTED NOT DETECTED   Giardia lamblia NOT DETECTED NOT DETECTED   Adenovirus F40/41 NOT DETECTED NOT DETECTED   Astrovirus DETECTED (A) NOT DETECTED   Norovirus GI/GII NOT DETECTED NOT DETECTED   Rotavirus A NOT DETECTED NOT DETECTED   Sapovirus (I, II, IV, and V) NOT  DETECTED NOT DETECTED    Comment: Performed at [facility], [address], [place], [place] [address]  Urine Culture     Status: Abnormal   Collection Time: 03/19/24  6:30 AM   Specimen: Urine, Random  Result Value Ref Range   Specimen Description      URINE, RANDOM Performed at [facility], [address] [place], [place] [address]    Special Requests      NONE Reflexed from [id number] Performed at [facility], [address] [place], [place] [address]    Culture (A)     <10,000 COLONIES/mL INSIGNIFICANT GROWTH Performed at [facility], [address], [place], [place] [address]    Report Status 03/20/2024 FINAL   PTH, intact and calcium     Status: Abnormal   Collection Time: 03/30/24 10:55 AM  Result Value Ref Range   PTH 76 (H) 15 - 65 pg/mL   PTH Interp Comment     Comment: Interpretation                 Intact PTH    Calcium                                 (pg/mL)      (mg/dL) Normal                          15 - 65     8.6 - 10.2 Primary Hyperparathyroidism         >65          >10.2 Secondary Hyperparathyroidism       >65          <10.2 Non-Parathyroid  Hypercalcemia       <65          >10.2 Hypoparathyroidism                  <15          < 8.6 Non-Parathyroid  Hypocalcemia  15 - 65          < 8.6   Comprehensive metabolic panel     Status: Abnormal   Collection Time: 03/30/24 10:55 AM  Result Value Ref Range   Glucose 99 70 - 99 mg/dL   BUN 22 8 - 27 mg/dL   Creatinine, Ser 8.76 (H) 0.57 - 1.00 mg/dL   eGFR 48 (L) >40 fO/fpw/8.26   BUN/Creatinine Ratio 18 12 - 28   Sodium 142 134 - 144 mmol/L   Potassium 4.7 3.5 - 5.2 mmol/L   Chloride 105 96 - 106 mmol/L   CO2 21 20 - 29 mmol/L   Calcium 11.2 (H) 8.7 - 10.3 mg/dL   Total Protein 5.9 (L) 6.0 - 8.5 g/dL   Albumin 4.2 3.9 - 4.9 g/dL   Globulin, Total 1.7 1.5 - 4.5 g/dL   Bilirubin Total 0.4 0.0 - 1.2 mg/dL   Alkaline Phosphatase 70 44 - 121 IU/L   AST 18 0 - 40 IU/L   ALT 17 0 - 32 IU/L   VITAMIN D  25 Hydroxy (Vit-D Deficiency, Fractures)     Status: None   Collection Time: 03/30/24 10:55 AM  Result Value Ref Range   Vit D, 25-Hydroxy 48.4 30.0 - 100.0 ng/mL    Comment: Vitamin D  deficiency has been defined by the Institute of Medicine and an Endocrine Society practice guideline as a level of serum 25-OH vitamin D  less than 20 ng/mL (1,2). The Endocrine Society went on to further define vitamin D  insufficiency as a level between 21 and 29 ng/mL (2). 1. IOM (Institute of Medicine). 2010. Dietary reference    intakes for calcium and D. [place]: The    [employer]. 2. Holick MF, Binkley , Bischoff-Ferrari HA, et al.    Evaluation, treatment, and prevention of vitamin D     deficiency: an Endocrine Society clinical practice    guideline. JCEM. 2011 Jul; 96(7):1911-30.   Urinalysis, Routine w reflex microscopic     Status: Abnormal   Collection Time: 04/01/24  9:44 AM  Result Value Ref Range   Specific Gravity, UA 1.025 1.005 - 1.030   pH, UA 5.5 5.0 - 7.5   Color, UA Yellow Yellow   Appearance Ur Clear Clear   Leukocytes,UA Negative Negative   Protein,UA Negative Negative/Trace   Glucose, UA Negative Negative   Ketones, UA Negative Negative   RBC, UA Trace (A) Negative   Bilirubin, UA Negative Negative   Urobilinogen, Ur 0.2 0.2 - 1.0 mg/dL   Nitrite, UA Negative Negative   Microscopic Examination See below:     Comment: Microscopic was indicated and was performed.  Microscopic Examination     Status: Abnormal   Collection Time: 04/01/24  9:44 AM   Urine  Result Value Ref Range   WBC, UA 0-5 0 - 5 /hpf   RBC, Urine 3-10 (A) 0 - 2 /hpf   Epithelial Cells (non renal) 0-10 0 - 10 /hpf   Crystals Present (A) N/A   Crystal Type Calcium Oxalate N/A   Bacteria, UA None seen None seen/Few   Nuclear medicine parathyroid  sestamibi scan on April 19, 2024 FINDINGS: Immediate phase images demonstrates expected uptake of radiotracer within the thyroid  gland and salivary glands. 2 hour delayed SPECT images demonstrate an intense focus of radiotracer uptake posterior to the lower pole the right thyroid lobe compatible with a parathyroid  adenoma. This corresponds to a mildly hyperdense nodule seen posterior to the trachea and along the right  lateral aspect of the esophagus on CT examination of 11/29/2023 (image # 5, series # 2).   IMPRESSION: Parathyroid  adenoma posterior to the lower pole of the right thyroid lobe. See corresponding nodule on CT examination of 11/29/2023.    Assessment: 1.  Elevated PTH with mild hypercalcemia  on the background of improving CKD  2.  Hypercalcemia with parathyroid  adenoma  Plan: Patient continues to have mild hypercalcemia currently at 11.2 associated with higher PTH of 76.   PTH RP was undetectable in May 2024  -She has osteopenia, and remote past history of nephrolithiasis. -Denies history of fragility fractures. No abdominal pain, no major mood disorders, no bone pain.  - I discussed with the patient and her sister about the physiology of calcium and parathyroid  hormone, and possible  effects of  increased PTH/ Calcium , including kidney stones, cardiac dysrhythmias, osteoporosis, abdominal pain, etc.   -It was not practical in her case to do 24-hour urine calcium measurement to rule out FHH.   - Parathyroid  scan shows parathyroid  adenoma posterior to the lower pole of the right thyroid lobe.  This is the most likely etiology behind her PTH dependent hypercalcemia . - Options were discussed.  She agrees with surgical intervention.  I discussed and made arrangements for her to see Dr.  Krystal Spinner for possible parathyroidectomy.  She will return in 5 weeks with repeat labs for reevaluation. Regarding her current diarrhea, she is encouraged to see her PCP today for better assessment.  Her DEXA scan was reviewed showing osteopenia of hips. She is advised to continue Zetia 10 mg p.o. daily for  hyperlipidemia, Janumet 50/1000 mg p.o. twice daily for type 2 diabetes.  She is advised to maintain close follow-up with her PMD.   I spent  22  minutes in the care of the patient today including review of labs from Thyroid Function, CMP, and other relevant labs ; imaging/biopsy records (current and previous including abstractions from other facilities); face-to-face time discussing  her lab results and symptoms, medications doses, her options of short and long term treatment based on the latest standards of care / guidelines;   and documenting the encounter.  Inocente Au  participated in the discussions, expressed understanding, and voiced agreement with the above plans.  All questions were answered to her satisfaction. she is encouraged to contact clinic should she have any questions or concerns prior to her return visit.    - Return in about 5 weeks (around 06/01/2024) for F/U with Labs after Surgery.   Ranny Earl, MD [facility] [address] [place], [place] [address] Phone: [phone]  Fax: [phone]    This note was partially dictated with voice recognition software. Similar sounding words can be transcribed inadequately or may not  be corrected upon review.  04/27/2024, 12:36 PM

## 2024-05-03 ENCOUNTER — Ambulatory Visit (INDEPENDENT_AMBULATORY_CARE_PROVIDER_SITE_OTHER): Admitting: Gastroenterology

## 2024-05-03 ENCOUNTER — Encounter (INDEPENDENT_AMBULATORY_CARE_PROVIDER_SITE_OTHER): Payer: Self-pay | Admitting: Gastroenterology

## 2024-05-03 VITALS — BP 114/72 | HR 74 | Temp 97.1°F | Ht 65.0 in | Wt 187.9 lb

## 2024-05-03 DIAGNOSIS — R112 Nausea with vomiting, unspecified: Secondary | ICD-10-CM | POA: Diagnosis not present

## 2024-05-03 DIAGNOSIS — A084 Viral intestinal infection, unspecified: Secondary | ICD-10-CM

## 2024-05-03 DIAGNOSIS — R197 Diarrhea, unspecified: Secondary | ICD-10-CM | POA: Insufficient documentation

## 2024-05-03 DIAGNOSIS — Z8619 Personal history of other infectious and parasitic diseases: Secondary | ICD-10-CM | POA: Diagnosis not present

## 2024-05-03 NOTE — Progress Notes (Unsigned)
 Toribio Fortune, M.D. Gastroenterology & Hepatology [facility] [address] [place], [place] [address]  Primary Care Physician: Francis Alan CROME, FNP [address] [place] [address]  I will communicate my assessment and recommendations to the referring MD via EMR.  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  The patient was last seen on 04/12/2024. At that time, the patient was advised to continue Zofran  as needed for nausea and vomiting, as well as to continue advancing her diet as tolerated.  Patient reports on last Monday she had one episode of vomiting. She also states she was diaphoretic and with chills.  She states since that day, she presented new onset of diarrhea multiple times for the next 3-4 days. She states on Friday and Saturday her symptoms resolved but then yesterday she had 2-3 watery bowel movements. She states that she believes the bowel movements worsened after she took dicyclomine . She is not having too much food as she has been nauseated.  Notably, prior to Monday she was not taking any Bentyl  as she was feeling well. She has had abdominal pain in her lower abdomen recently.  The patient denies having any nausea, vomiting, fever, chills, hematochezia, melena, hematemesis, diarrhea, jaundice, pruritus.  Last Colonoscopy:04/02/22 - Three 3 to 5 mm polyps in the ascending colon and in the cecum - One 2 mm polyp in the ascending colon - Two 4 to 5 mm polyps in the transverse colon  - Diverticulosis in the sigmoid colon.  Biopsied. - Congested mucosa in the rectum. normal colon Biopsied. - Non-bleeding external and internal hemorrhoids. - Hemorrhoids found on perianal exam. (6 TAs, normal colonic biopsies)   Last Endoscopy:10/26/21 normal esophagus  - Multiple gastric polyps. Fundic gland polyps - rest of the examined stomach was normal. Biopsied-normal - Normal duodenum. Biopsied-normal   ERCP 03/11/2023 by Dr. Ethel. CBD measuring 11 mm and there was no presence of filling defects in the bile duct, with normal intrahepatic ducts. The biliary tree was swept with a 12 mm balloon and some sludge was removed.   Past Medical History: Past Medical History:  Diagnosis Date   Basal cell carcinoma (BCC) of skin of face 10/2023   left side   Chronic kidney disease    Diabetes mellitus without complication (HCC)    H/O blood clots    Headache    History of hypertension    Hyperlipidemia    Lupus    Mycobacterial disease    Sicca (HCC)     Past Surgical History: Past Surgical History:  Procedure Laterality Date   ABDOMINAL HYSTERECTOMY     BIOPSY  10/26/2021   Procedure: BIOPSY;  Surgeon: Fortune Angelia Toribio, MD;  [facility];  Service: Gastroenterology;;   BIOPSY  04/02/2022   Procedure: BIOPSY;  Surgeon: Fortune Angelia Toribio, MD;  [facility];  Service: Gastroenterology;;   CHOLECYSTECTOMY N/A 03/07/2023   Procedure: LAPAROSCOPIC CHOLECYSTECTOMY;  Surgeon: Evonnie Dorothyann LABOR, DO;  Location: [facility];  Service: General;  Laterality: N/A;   COLONOSCOPY  05/26/2015   Dr donnel, mild diverticulosis in sigmoid colon, biosies taken from [facility] and sigmoid colon-negative   COLONOSCOPY  WITH PROPOFOL  N/A 04/02/2022   Procedure: COLONOSCOPY WITH PROPOFOL ;  Surgeon: Eartha Angelia Sieving, MD;  [facility];  Service: Gastroenterology;  Laterality: N/A;  815   CYSTOSCOPY WITH RETROGRADE PYELOGRAM, URETEROSCOPY AND STENT PLACEMENT Right 01/30/2023   Procedure:  CYSTOSCOPY WITH RETROGRADE PYELOGRAM, URETEROSCOPY AND STENT PLACEMENT;  Surgeon: Sherrilee Belvie CROME, MD;  Location: [facility];  Service: Urology;  Laterality: Right;   ESOPHAGOGASTRODUODENOSCOPY (EGD) WITH PROPOFOL  N/A 10/26/2021   Procedure: ESOPHAGOGASTRODUODENOSCOPY (EGD) WITH PROPOFOL ;  Surgeon: Eartha Angelia Sieving, MD;  [facility];  Service: Gastroenterology;  Laterality: N/A;  1045   EXTRACORPOREAL SHOCK WAVE LITHOTRIPSY Right 12/03/2022   Procedure: EXTRACORPOREAL SHOCK WAVE LITHOTRIPSY (ESWL);  Surgeon: Sherrilee Belvie CROME, MD;  Location: [facility];  Service: Urology;  Laterality: Right;   HEMOSTASIS CLIP PLACEMENT  10/26/2021   Procedure: HEMOSTASIS CLIP PLACEMENT;  Surgeon: Eartha Angelia Sieving, MD;  [facility];  Service: Gastroenterology;;   HOLMIUM LASER APPLICATION Right 01/30/2023   Procedure: HOLMIUM LASER APPLICATION;  Surgeon: Sherrilee Belvie CROME, MD;  Location: [facility];  Service: Urology;  Laterality: Right;   HOT HEMOSTASIS  10/26/2021   Procedure: HOT HEMOSTASIS (ARGON PLASMA COAGULATION/BICAP);  Surgeon: Eartha Angelia, Sieving, MD;  [facility];  Service: Gastroenterology;;   hystorectomy     POLYPECTOMY  10/26/2021   Procedure: POLYPECTOMY;  Surgeon: Eartha Angelia Sieving, MD;  [facility];  Service: Gastroenterology;;   POLYPECTOMY  04/02/2022   Procedure: POLYPECTOMY;  Surgeon: Eartha Angelia, Sieving, MD;  [facility];  Service: Gastroenterology;;   SINUS SURGERY WITH INSTATRAK  2015   SKIN BIOPSY      Family History: Family History  Problem Relation Age of Onset   Hypertension Mother    Diabetes Mother    Kidney disease Mother    Heart disease Mother    High Cholesterol Mother    Lung cancer Mother    Hypertension Father    Diabetes Father    Kidney disease Father    Heart disease Father     Social History: Social History   Tobacco Use  Smoking Status Never   Passive  exposure: Never  Smokeless Tobacco Never   Social History   Substance and Sexual Activity  Alcohol  Use No   Social History   Substance and Sexual Activity  Drug Use No    Allergies: Allergies  Allergen Reactions   Sulfa Antibiotics Hives   Caffeine Other (See Comments)    Bladder infections   Codeine Nausea And Vomiting    Medications: Current Outpatient Medications  Medication Sig Dispense Refill   acetaminophen  (TYLENOL ) 500 MG tablet Take 2 tablets (1,000 mg total) by mouth every 8 (eight) hours as needed for mild pain, fever or headache.     apixaban  (ELIQUIS ) 5 MG TABS tablet Take 1 tablet (5 mg total) by mouth 2 (two) times daily. To be resume at the moment of discharge and after all procedures completed.     azelastine (ASTELIN) 0.1 % nasal spray Place 1 spray into both nostrils daily.     Cinnamon 500 MG capsule Take 500 mg by mouth daily.     dicyclomine  (BENTYL ) 10 MG capsule Take 1 capsule (10 mg total) by mouth every 8 (eight) hours as needed (abdominal pain or diarrhea). 90 capsule 0   ezetimibe (ZETIA) 10 MG tablet Take 10 mg by mouth daily.     fenofibrate (TRICOR) 145 MG tablet Take 145 mg by mouth every evening.  ferrous sulfate  325 (65 FE) MG EC tablet Take 1 tablet (325 mg total) by mouth daily with breakfast. Resume at time of discharge     furosemide  (LASIX ) 20 MG tablet TAKE (1) TABLET BY MOUTH ONCE DAILY AS NEEDED. 30 tablet 0   HYDROcodone  Bit-Homatrop MBr (HYCODAN PO) Take by mouth as needed.     hydrocortisone  (ANUSOL -HC) 2.5 % rectal cream Place 1 Application rectally 4 (four) times daily. Use four times per day x10 days then PRN thereafter 56 g 1   levocetirizine (XYZAL) 5 MG tablet Take 5 mg by mouth every evening.     loperamide (IMODIUM A-D) 2 MG tablet Take 2 mg by mouth 2 (two) times daily as needed for diarrhea or loose stools.     metFORMIN (GLUCOPHAGE) 1000 MG tablet Take 1,000 mg by mouth 2 (two) times daily.     Multiple Vitamin  (MULTIVITAMIN) tablet Take 1 tablet by mouth in the morning.     ondansetron  (ZOFRAN ) 4 MG tablet Take 1 tablet (4 mg total) by mouth every 8 (eight) hours as needed for nausea or vomiting. 4 tablet 0   pantoprazole  (PROTONIX ) 40 MG tablet TAKE ONE TABLET BY MOUTH EVERY DAY 90 tablet 2   pioglitazone (ACTOS) 15 MG tablet Take 15 mg by mouth daily.     polyvinyl alcohol  (LIQUIFILM TEARS) 1.4 % ophthalmic solution Place 1 drop into both eyes as needed for dry eyes.     predniSONE  5 MG TBEC Take 5 mg by mouth daily with breakfast.     Turmeric (QC TUMERIC COMPLEX PO) Take by mouth daily at 6 (six) AM.     No current facility-administered medications for this visit.    Review of Systems: GENERAL: negative for malaise, night sweats HEENT: No changes in hearing or vision, no nose bleeds or other nasal problems. NECK: Negative for lumps, goiter, pain and significant neck swelling RESPIRATORY: Negative for cough, wheezing CARDIOVASCULAR: Negative for chest pain, leg swelling, palpitations, orthopnea GI: SEE HPI MUSCULOSKELETAL: Negative for joint pain or swelling, back pain, and muscle pain. SKIN: Negative for lesions, rash PSYCH: Negative for sleep disturbance, mood disorder and recent psychosocial stressors. HEMATOLOGY Negative for prolonged bleeding, bruising easily, and swollen nodes. ENDOCRINE: Negative for cold or heat intolerance, polyuria, polydipsia and goiter. NEURO: negative for tremor, gait imbalance, syncope and seizures. The remainder of the review of systems is noncontributory.   Physical Exam: BP 114/72 (BP Location: Left Arm, Patient Position: Sitting, Cuff Size: Normal)   Pulse 74   Temp (!) 97.1 F (36.2 C) (Temporal)   Ht 5' 5 (1.651 m)   Wt 187 lb 14.4 oz (85.2 kg)   BMI 31.27 kg/m  GENERAL: The patient is AO x3, in no acute distress. HEENT: Head is normocephalic and atraumatic. EOMI are intact. Mouth is well hydrated and without lesions. NECK: Supple. No  masses LUNGS: Clear to auscultation. No presence of rhonchi/wheezing/rales. Adequate chest expansion HEART: RRR, normal s1 and s2. ABDOMEN: Soft, nontender, no guarding, no peritoneal signs, and nondistended. BS +. No masses. RECTAL EXAM: no external lesions, normal tone, no masses, brown stool without blood.*** Chaperone: EXTREMITIES: Without any cyanosis, clubbing, rash, lesions or edema. NEUROLOGIC: AOx3, no focal motor deficit. SKIN: no jaundice, no rashes  Imaging/Labs: as above  I personally reviewed and interpreted the available labs, imaging and endoscopic files.  Impression and Plan: Avantika Shere is a 68 y.o. female coming for follow up of ***   All questions were answered.  Toribio Fortune, MD Gastroenterology and Hepatology [facility]

## 2024-05-03 NOTE — Patient Instructions (Addendum)
 Perform stool workup Hold off on restarting dicyclomine  until results of stool testing are back Continue with Zofran  as needed for nausea and vomiting

## 2024-05-10 ENCOUNTER — Ambulatory Visit (INDEPENDENT_AMBULATORY_CARE_PROVIDER_SITE_OTHER): Payer: Self-pay | Admitting: Gastroenterology

## 2024-05-10 LAB — C. DIFFICILE GDH AND TOXIN A/B
GDH ANTIGEN: NOT DETECTED
MICRO NUMBER:: 16737510
SPECIMEN QUALITY:: ADEQUATE
TOXIN A AND B: NOT DETECTED

## 2024-05-10 LAB — GASTROINTESTINAL PATHOGEN PNL
CampyloBacter Group: NOT DETECTED
Norovirus GI/GII: NOT DETECTED
Rotavirus A: NOT DETECTED
Salmonella species: NOT DETECTED
Shiga Toxin 1: NOT DETECTED
Shiga Toxin 2: NOT DETECTED
Shigella Species: NOT DETECTED
Vibrio Group: NOT DETECTED
Yersinia enterocolitica: NOT DETECTED

## 2024-05-10 LAB — OVA AND PARASITE EXAMINATION
CONCENTRATE RESULT:: NONE SEEN
MICRO NUMBER:: 16737218
SPECIMEN QUALITY:: ADEQUATE
TRICHROME RESULT:: NONE SEEN

## 2024-05-12 ENCOUNTER — Ambulatory Visit: Payer: Self-pay | Admitting: Surgery

## 2024-05-12 ENCOUNTER — Other Ambulatory Visit (INDEPENDENT_AMBULATORY_CARE_PROVIDER_SITE_OTHER): Payer: Self-pay | Admitting: Gastroenterology

## 2024-05-12 DIAGNOSIS — K580 Irritable bowel syndrome with diarrhea: Secondary | ICD-10-CM

## 2024-05-12 MED ORDER — RIFAXIMIN 550 MG PO TABS: 550.0000 mg | ORAL_TABLET | Freq: Three times a day (TID) | ORAL | 0 refills | Status: DC

## 2024-05-12 NOTE — Progress Notes (Signed)
 Patient reports feeling some improvement with dicyclomine  but still having some abdominal cramping.  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  Will send a course of Xifaxan  for 2 weeks.

## 2024-05-12 NOTE — Progress Notes (Signed)
 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

## 2024-05-13 ENCOUNTER — Telehealth (INDEPENDENT_AMBULATORY_CARE_PROVIDER_SITE_OTHER): Payer: Self-pay | Admitting: Gastroenterology

## 2024-05-13 NOTE — Telephone Encounter (Signed)
 PA completed for Xifaxan  550mg  Request Reference Number: [id number]. XIFAXAN  TAB 550MG  is approved through 05/27/2024. Your patient may now fill this prescription and it will be covered.. Authorization Expiration Date: May 27, 2024.  Contacted [employer] to let them know that PA was approved. Spoke with Mose and he states it went through but pt co pay in $729. Mose states they will speak to her when she comes in to see what she would like to do.

## 2024-05-13 NOTE — Telephone Encounter (Signed)
 Kim from [employer] left voicemail stating that PA was approved but pt copay is $729. No coupons available since pt had Medicare Part D. Pt would like to know if something cheaper can be called in. Please advise. Thank you!

## 2024-05-14 NOTE — Telephone Encounter (Signed)
 Unfortunately, Xifaxan  would be the best option. She can also order it from [place] and we can give her the paper script.

## 2024-05-17 NOTE — Telephone Encounter (Signed)
 Pt contacted but spoke with Andres, pt sister. Sister states that they have ordered the medication and it will be in today. Sister stated that they figured it was the best option so they ordered it.

## 2024-05-25 ENCOUNTER — Other Ambulatory Visit (INDEPENDENT_AMBULATORY_CARE_PROVIDER_SITE_OTHER): Payer: Self-pay | Admitting: Gastroenterology

## 2024-05-25 NOTE — Patient Instructions (Addendum)
 SURGICAL WAITING ROOM VISITATION Patients having surgery or a procedure may have no more than 2 support people in the waiting area - these visitors may rotate in the visitor waiting room.   Due to an increase in RSV and influenza rates and associated hospitalizations, children ages 29 and under may not visit patients in [facility] hospitals. If the patient needs to stay at the hospital during part of their recovery, the visitor guidelines for inpatient rooms apply.  PRE-OP VISITATION  Pre-op nurse will coordinate an appropriate time for 1 support person to accompany the patient in pre-op.  This support person may not rotate.  This visitor will be contacted when the time is appropriate for the visitor to come back in the pre-op area.  Please refer to the [facility] website for the visitor guidelines for Inpatients (after your surgery is over and you are in a regular room).  You are not required to quarantine at this time prior to your surgery. However, you must do this: Hand Hygiene often Do NOT share personal items Notify your provider if you are in close contact with someone who has COVID or you develop fever 100.4 or greater, new onset of sneezing, cough, sore throat, shortness of breath or body aches.  If you test positive for Covid or have been in contact with anyone that has tested positive in the last 10 days please notify you surgeon.    Your procedure is scheduled on: 06/03/24   Report to [facility] Main Entrance: [place] entrance where the [employer] is available.   Report to admitting at:5:15 AM  Call this number if you have any questions or problems the morning of surgery [phone]  FOLLOW ANY ADDITIONAL PRE OP INSTRUCTIONS YOU RECEIVED FROM YOUR SURGEON'S OFFICE!!!  Do not eat food after Midnight the night prior to your surgery/procedure.  After Midnight you may have the following liquids until: 4:30 AM DAY OF SURGERY  Clear Liquid Diet Water  Black  Coffee (sugar ok, NO MILK/CREAM OR CREAMERS)  Tea (sugar ok, NO MILK/CREAM OR CREAMERS) regular and decaf                             Plain Jell-O  with no fruit (NO RED)                                           Fruit ices (not with fruit pulp, NO RED)                                     Popsicles (NO RED)                                                                  Juice: NO CITRUS JUICES: only apple, WHITE grape, WHITE cranberry Sports drinks like Gatorade or Powerade (NO RED)   Oral Hygiene is also important to reduce your risk of infection.        Remember - BRUSH YOUR TEETH THE MORNING OF SURGERY WITH YOUR REGULAR TOOTHPASTE  Do NOT smoke after Midnight the night before surgery.  STOP TAKING all Vitamins, Herbs and supplements 1 week before your surgery.   Take ONLY these medicines the morning of surgery with A SIP OF WATER : levocetirizine,montelukast,prednisone ,pantoprazole .Tylenol ,dicyclomine  as needed.  If You have been diagnosed with Sleep Apnea - Bring CPAP mask and tubing day of surgery. We will provide you with a CPAP machine on the day of your surgery.                   You may not have any metal on your body including hair pins, jewelry, and body piercing  Do not wear make-up, lotions, powders, perfumes / cologne, or deodorant  Do not wear nail polish including gel and S&S, artificial / acrylic nails, or any other type of covering on natural nails including finger and toenails. If you have artificial nails, gel coating, etc., that needs to be removed by a nail salon, Please have this removed prior to surgery. Not doing so may mean that your surgery could be cancelled or delayed if the Surgeon or anesthesia staff feels like they are unable to monitor you safely.   Do not shave 48 hours prior to surgery to avoid nicks in your skin which may contribute to postoperative infections.   Contacts, Hearing Aids, dentures or bridgework may not be worn into surgery. DENTURES WILL  BE REMOVED PRIOR TO SURGERY PLEASE DO NOT APPLY Poly grip OR ADHESIVES!!!  You may bring a small overnight bag with you on the day of surgery, only pack items that are not valuable. [facility] IS NOT RESPONSIBLE   FOR VALUABLES THAT ARE LOST OR STOLEN.   Patients discharged on the day of surgery will not be allowed to drive home.  Someone NEEDS to stay with you for the first 24 hours after anesthesia.  Do not bring your home medications to the hospital. The Pharmacy will dispense medications listed on your medication list to you during your admission in the Hospital.  Special Instructions: Bring a copy of your healthcare power of attorney and living will documents the day of surgery, if you wish to have them scanned into your [facility] Records- EPIC  Please read over the following fact sheets you were given: IF YOU HAVE QUESTIONS ABOUT YOUR PRE-OP INSTRUCTIONS, PLEASE CALL [phone]   [facility] - Preparing for Surgery Before surgery, you can play an important role.  Because skin is not sterile, your skin needs to be as free of germs as possible.  You can reduce the number of germs on your skin by washing with CHG (chlorahexidine gluconate) soap before surgery.  CHG is an antiseptic cleaner which kills germs and bonds with the skin to continue killing germs even after washing. Please DO NOT use if you have an allergy to CHG or antibacterial soaps.  If your skin becomes reddened/irritated stop using the CHG and inform your nurse when you arrive at Short Stay. Do not shave (including legs and underarms) for at least 48 hours prior to the first CHG shower.  You may shave your face/neck.  Please follow these instructions carefully:  1.  Shower with CHG Soap the night before surgery and the  morning of surgery.  2.  If you choose to wash your hair, wash your hair first as usual with your normal  shampoo.  3.  After you shampoo, rinse your hair and body thoroughly to remove the  shampoo.  4.  Use CHG as you would any other liquid soap.  You can apply chg directly to the skin and wash.  Gently with a scrungie or clean washcloth.  5.  Apply the CHG Soap to your body ONLY FROM THE NECK DOWN.   Do not use on face/ open                           Wound or open sores. Avoid contact with eyes, ears mouth and genitals (private parts).                       Wash face,  Genitals (private parts) with your normal soap.             6.  Wash thoroughly, paying special attention to the area where your  surgery  will be performed.  7.  Thoroughly rinse your body with warm water  from the neck down.  8.  DO NOT shower/wash with your normal soap after using and rinsing off the CHG Soap.            9.  Pat yourself dry with a clean towel.            10.  Wear clean pajamas.            11.  Place clean sheets on your bed the night of your first shower and do not  sleep with pets.  ON THE DAY OF SURGERY : Do not apply any lotions/deodorants the morning of surgery.  Please wear clean clothes to the hospital/surgery center.     FAILURE TO FOLLOW THESE INSTRUCTIONS MAY RESULT IN THE CANCELLATION OF YOUR SURGERY  PATIENT SIGNATURE_________________________________  NURSE SIGNATURE__________________________________  ________________________________________________________________________

## 2024-05-26 ENCOUNTER — Encounter (HOSPITAL_COMMUNITY)
Admission: RE | Admit: 2024-05-26 | Discharge: 2024-05-26 | Disposition: A | Discharge status: Home / Self Care | Source: Ambulatory Visit | Attending: Surgery | Admitting: Surgery

## 2024-05-26 ENCOUNTER — Other Ambulatory Visit: Payer: Self-pay

## 2024-05-26 ENCOUNTER — Encounter (HOSPITAL_COMMUNITY): Payer: Self-pay

## 2024-05-26 VITALS — BP 120/67 | HR 69 | Temp 97.8°F | Ht 65.0 in | Wt 187.0 lb

## 2024-05-26 DIAGNOSIS — Z01812 Encounter for preprocedural laboratory examination: Secondary | ICD-10-CM | POA: Insufficient documentation

## 2024-05-26 DIAGNOSIS — E119 Type 2 diabetes mellitus without complications: Secondary | ICD-10-CM | POA: Diagnosis not present

## 2024-05-26 DIAGNOSIS — I10 Essential (primary) hypertension: Secondary | ICD-10-CM | POA: Insufficient documentation

## 2024-05-26 HISTORY — DX: Other specified disorders of bone density and structure, unspecified site: M85.80

## 2024-05-26 HISTORY — DX: Anemia, unspecified: D64.9

## 2024-05-26 HISTORY — DX: Peripheral vascular disease, unspecified: I73.9

## 2024-05-26 HISTORY — DX: Dyspnea, unspecified: R06.00

## 2024-05-26 HISTORY — DX: Pneumonia, unspecified organism: J18.9

## 2024-05-26 HISTORY — DX: Fatty (change of) liver, not elsewhere classified: K76.0

## 2024-05-26 HISTORY — DX: Unspecified osteoarthritis, unspecified site: M19.90

## 2024-05-26 LAB — BASIC METABOLIC PANEL WITH GFR
Anion gap: 8 (ref 5–15)
BUN: 28 mg/dL — ABNORMAL HIGH (ref 8–23)
CO2: 25 mmol/L (ref 22–32)
Calcium: 10.9 mg/dL — ABNORMAL HIGH (ref 8.9–10.3)
Chloride: 103 mmol/L (ref 98–111)
Creatinine, Ser: 1.35 mg/dL — ABNORMAL HIGH (ref 0.44–1.00)
GFR, Estimated: 43 mL/min — ABNORMAL LOW (ref >60)
Glucose, Bld: 156 mg/dL — ABNORMAL HIGH (ref 70–99)
Potassium: 3.9 mmol/L (ref 3.5–5.1)
Sodium: 136 mmol/L (ref 135–145)

## 2024-05-26 LAB — CBC
HCT: 41.5 % (ref 36.0–46.0)
Hemoglobin: 12.4 g/dL (ref 12.0–15.0)
MCH: 27.5 pg (ref 26.0–34.0)
MCHC: 29.9 g/dL — ABNORMAL LOW (ref 30.0–36.0)
MCV: 92 fL (ref 80.0–100.0)
Platelets: 252 K/uL (ref 150–400)
RBC: 4.51 MIL/uL (ref 3.87–5.11)
RDW: 14.8 % (ref 11.5–15.5)
WBC: 7.7 K/uL (ref 4.0–10.5)
nRBC: 0 % (ref 0.0–0.2)

## 2024-05-26 LAB — HEMOGLOBIN A1C
Hgb A1c MFr Bld: 5.8 % — ABNORMAL HIGH (ref 4.8–5.6)
Mean Plasma Glucose: 120 mg/dL

## 2024-05-26 LAB — GLUCOSE, CAPILLARY: Glucose-Capillary: 165 mg/dL — ABNORMAL HIGH (ref 70–99)

## 2024-05-26 NOTE — Progress Notes (Addendum)
 For Anesthesia: PCP - Joy Alan CROME, FNP  Cardiologist - N/A  Bowel Prep reminder:  Chest x-ray - 11/29/23 EKG - 12/01/23 Stress Test -  ECHO - 06/28/16 Cardiac Cath -  Pacemaker/ICD device last checked: Pacemaker orders received: Device Rep notified:  Spinal Cord Stimulator: N/A  Sleep Study - Yes CPAP - N/A  Fasting Blood Sugar -  N/A Checks Blood Sugar __0___ times a day Date and result of last Hgb A1c-  Last dose of GLP1 agonist-  N/A GLP1 instructions:   Last dose of SGLT-2 inhibitors-  N/A SGLT-2 instructions:   Blood Thinner Instructions: Eliquis .:Will be hold after: 05/30/24 Aspirin Instructions: Last Dose:  Activity level:    Unable to go up a flight of stairs without shortness of breath    Anesthesia review: Hx: CKD 3,DIA,HTN,DVT  Patient denies shortness of breath, fever, cough and chest pain at PAT appointment   Patient verbalized understanding of instructions that were reviewed over the telephone.

## 2024-05-28 ENCOUNTER — Other Ambulatory Visit (INDEPENDENT_AMBULATORY_CARE_PROVIDER_SITE_OTHER): Payer: Self-pay | Admitting: Gastroenterology

## 2024-05-28 ENCOUNTER — Encounter (HOSPITAL_COMMUNITY): Payer: Self-pay | Admitting: Surgery

## 2024-05-28 DIAGNOSIS — K580 Irritable bowel syndrome with diarrhea: Secondary | ICD-10-CM

## 2024-05-28 NOTE — Telephone Encounter (Signed)
 This was for a short course of 14 days and patient should have completed the course. NO need for refills.

## 2024-05-28 NOTE — Progress Notes (Signed)
 Updated date of surgery: 05/31/24  Updated time of arrival: 1045 AM  May have liquids until 10AM morning of surgery  Last dose of Eliquis  Tuesday evening 05/27/24  Patient will be discharged from hospital and monitored at home for 24 hours by: Boby (sister) [phone]   Patient denies any changes in allergies, medications, medical history since pre op appointment on: 05/26/24  Pre op instructions reviewed, follow up questions addressed and patient verbalized understanding at this time.

## 2024-05-28 NOTE — H&P (Signed)
 REFERRING PHYSICIAN: Nida, Gebreselassie, MD  PROVIDER: Talecia Sherlin OZELL SPINNER, MD   Chief Complaint: New Consultation (Primary hyperparathyroidism)  History of Present Illness:  Patient is referred by Dr. Gebreselassie Nida for surgical evaluation and management of newly diagnosed primary hyperparathyroidism. Patient had been noted on routine laboratory studies to have an elevated serum calcium level. Her most recent level was elevated at 11.2. Intact PTH level was elevated at 76. 25-hydroxy vitamin D  level was normal at 48. Patient subsequently underwent a nuclear medicine parathyroid  scan which localized a right inferior parathyroid  adenoma. This was also apparent on an unrelated CT scan performed in February 2025. Patient is symptomatic. She notes chronic fatigue. She has bone and joint discomfort. She has a history of nephrolithiasis. Patient also complains of GI symptoms including abdominal pain and constipation. Patient has had no prior head or neck surgery. There is a family history of benign thyroid disease but no family history of parathyroid  disease or other endocrine neoplasms. Patient presents today accompanied by her sisters to discuss parathyroid  surgery.  Patient does have a history of deep venous thrombosis. She is on chronic anticoagulation with Eliquis . This is apparently being managed by her primary care provider.  Review of Systems: A complete review of systems was obtained from the patient. I have reviewed this information and discussed as appropriate with the patient. See HPI as well for other ROS.  Review of Systems  Constitutional: Positive for malaise/fatigue.  HENT: Negative.  Eyes: Negative.  Respiratory: Negative.  Cardiovascular: Negative.  Gastrointestinal: Positive for abdominal pain and constipation.  Genitourinary:  Stage III CKD nephrolithiasis  Musculoskeletal: Positive for joint pain.  Skin: Negative.  Neurological: Positive for tremors.   Endo/Heme/Allergies: Negative.  Psychiatric/Behavioral: Negative.    Medical History: Past Medical History:  Diagnosis Date  Arthritis  Chronic kidney disease  Diabetes mellitus without complication (CMS/HHS-HCC)  DVT (deep venous thrombosis) (CMS/HHS-HCC)  GERD (gastroesophageal reflux disease)  History of cancer  History of shingles  Hyperlipidemia  Hypertension  Lupus  Mycobacterial disease, cutaneous  Nausea without vomiting 02/21/2022  Rectal bleeding 06/24/2022  Thyroid disease   Patient Active Problem List  Diagnosis  Hyperlipidemia  Type 2 diabetes mellitus without complication (CMS/HHS-HCC)  Dyspnea  Preseptal cellulitis of left eye  Zoster ocular disease  Lupus  Orbital cellulitis, left  History of pulmonary embolism  Pulmonary embolism (CMS/HHS-HCC)  Mycobacterial disease, cutaneous  Edema  Gastroesophageal reflux disease  Hypercalcemia  Loss of weight  Melena  Otalgia  Wheezing  Abdominal pain, right upper quadrant  Diarrhea  Nausea without vomiting  Stage 3b chronic kidney disease (CMS/HHS-HCC)  History of DVT (deep vein thrombosis)  Anemia in other chronic diseases classified elsewhere  Class 1 obesity  Hemorrhoids  Primary hyperparathyroidism (CMS/HHS-HCC)  Systemic lupus erythematosus (SLE) in adult (CMS/HHS-HCC)  Personal history of other malignant neoplasm of skin   Past Surgical History:  Procedure Laterality Date  ORBITOTOMY EXPLORATION W/WO BIOPSY Left 07/05/2022  Procedure: ORBITOTOMY WITHOUT BONE FLAP (FRONTAL OR TRANSCONJUNCTIVAL APPROACH); FOR EXPLORATION, WITH OR WITHOUT BIOPSY; Surgeon: Harriette Mliss Caldron, MD; [facility]; Service: Ophthalmology; Laterality: Left;  CHOLECYSTECTOMY  COLONOSCOPY  HYSTERECTOMY  upper endoscopy  WASH SINUS    Allergies  Allergen Reactions  Sulfa (Sulfonamide Antibiotics) Hives and Shortness Of Breath  Codeine Vomiting   Current Outpatient Medications on File Prior to Visit   Medication Sig Dispense Refill  acetaminophen  (TYLENOL ) 325 MG tablet Take 650 mg by mouth every 4 (four) hours as needed for  Pain  apixaban  (ELIQUIS ) 5 mg tablet Take 5 mg by mouth every 12 (twelve) hours  azelastine (ASTELIN) 137 mcg nasal spray PLACE ONE SPRAY IN EACH NOSTRIL TWICE DAILY 30 mL 11  DIALYVITE VITAMIN D3 MAX 1,250 mcg (50,000 unit) Tab Dialyvite Vitamin D3 Max( 1,250 mcg(50,000 un oral ) Active -Hx Entry Oral; Duration: 0  doxycycline (MONODOX) 100 MG capsule TAKE ONE CAPSULE BY MOUTH ONCE DAILY WITH FOOD FOR ONE MONTH  ezetimibe (ZETIA) 10 mg tablet Take 10 mg by mouth once daily  fenofibrate nanocrystallized (TRICOR) 145 MG tablet Take 145 mg by mouth once daily  ferrous sulfate  325 (65 FE) MG EC tablet Take 325 mg by mouth daily with breakfast  FUROsemide  (LASIX ) 20 MG tablet Take 1 tablet (20 mg total) by mouth once daily as needed for Edema For leg swelling 30 tablet 0  loperamide (IMODIUM A-D) 2 mg tablet Take 1 tablet (2 mg total) by mouth 2 (two) times daily as needed for Diarrhea (diarrhea) 30 tablet 0  metFORMIN (GLUCOPHAGE) 1000 MG tablet Take 1,000 mg by mouth 2 (two) times daily  montelukast (SINGULAIR) 10 mg tablet Take 10 mg by mouth once daily  multivitamin tablet Take 1 tablet by mouth once daily  ondansetron  (ZOFRAN ) 4 MG tablet  ondansetron  (ZOFRAN -ODT) 4 MG disintegrating tablet every 8 (eight) hours as needed  pantoprazole  (PROTONIX ) 40 MG DR tablet once daily  pioglitazone (ACTOS) 15 MG tablet 15 mg once daily  polyvinyl alcohol  (LIQUITEARS) 1.4 % ophthalmic solution Apply 1 drop to eye as needed  predniSONE  (DELTASONE ) 5 MG tablet Take 5 mg by mouth once daily  promethazine -dextromethorphan (PROMETHAZINE -DM) 6.25-15 mg/5 mL syrup take 5ml by mouth every 6 hours as needed  albuterol  90 mcg/actuation inhaler Inhale 1 Puff(s) into the lungs every 6 (six) hours as needed  alfuzosin  (UROXATRAL ) 10 mg ER tablet Take 10 mg by mouth at bedtime  cyanocobalamin  (VITAMIN B12) 1000 MCG tablet Take 1,000 mcg by mouth once daily  ergocalciferol , vitamin D2, 1,250 mcg (50,000 unit) capsule Take 1 capsule (50,000 Units total) by mouth every 14 (fourteen) days (Patient taking differently: Take 50,000 Units by mouth Takes every 3 weeks) 8 capsule 0  fexofenadine (ALLEGRA) 180 MG tablet Take 1-2 tabs daily PRN with itch. (Patient not taking: Reported on 05/12/2024) 50 tablet 1  hydrocortisone  (ANUSOL -HC) 2.5 % rectal cream Place rectally (Patient not taking: Reported on 05/12/2024)  imiquimod  (ALDARA ) 5 % cream APPLY ONE PACKET TOPICALLY THREE TIMES A WEEK 12 packet 2  levocetirizine (XYZAL) 5 MG tablet Take 5 mg by mouth every evening (Patient not taking: Reported on 05/12/2024)  meclizine (ANTIVERT) 25 MG tablet Take 25 mg by mouth as needed (Patient not taking: Reported on 05/12/2024)  methotrexate (RHEUMATREX) 2.5 MG tablet  predniSONE  (DELTASONE ) 20 MG tablet Take 10 mg by mouth Alternates 10 mg and 5 mg every other day  pregabalin  (LYRICA ) 75 MG capsule take one capsule by mouth every 8 hours 270 capsule 1  Saccharomyces boulardii (FLORASTOR) 250 mg capsule Take 250 mg by mouth once daily  sitaGLIPtin-metFORMIN (JANUMET) 50-1,000 mg tablet Take 1 tablet by mouth 2 (two) times daily with meals (Patient not taking: Reported on 05/12/2024)  turmeric-turmeric root extract 450-50 mg Cap Take by mouth once daily (Patient not taking: Reported on 05/12/2024)   No current facility-administered medications on file prior to visit.   Family History  Problem Relation Age of Onset  COPD Mother  Asthma Mother  High blood pressure (Hypertension) Mother  Kidney failure Mother  Hyperlipidemia (Elevated cholesterol) Mother  Diabetes type II Mother  High blood pressure (Hypertension) Sister  Hyperlipidemia (Elevated cholesterol) Sister  Diabetes type II Sister  Anesthesia problems Neg Hx    Social History   Tobacco Use  Smoking Status Never  Smokeless Tobacco Never     Social History   Socioeconomic History  Marital status: Divorced  Tobacco Use  Smoking status: Never  Smokeless tobacco: Never  Vaping Use  Vaping status: Never Used  Substance and Sexual Activity  Alcohol  use: Never  Drug use: Never  Sexual activity: Defer   Social Drivers of Health   Financial Resource Strain: Low Risk (11/07/2021)  Overall Financial Resource Strain (CARDIA)  Difficulty of Paying Living Expenses: Not hard at all  Food Insecurity: Low Risk (03/09/2023)  Received from [facility]  Hunger Vital Sign  Within the past 12 months, you worried that your food would run out before you got money to buy more: Never true  Within the past 12 months, the food you bought just didn't last and you didn't have money to get more. : Never true  Transportation Needs:  Recent Concern: Transportation Needs - Unmet Transportation Needs (03/09/2023)  Received from [employer]  In the past 12 months, has lack of reliable transportation kept you from medical appointments, meetings, work or from getting things needed for daily living? : Yes  Housing Stability: Unknown (10/31/2023)  Housing Stability Vital Sign  Homeless in the Last Year: No   Objective:   Vitals:  BP: 131/79  Pulse: 106  Temp: 37.1 C (98.8 F)  SpO2: 99%  Weight: 84.9 kg (187 lb 3.2 oz)  Height: 165.1 cm (5' 5)  PainSc: 0-No pain   Body mass index is 31.15 kg/m.  Physical Exam   GENERAL APPEARANCE Comfortable, no acute issues Development: normal Gross deformities: none  SKIN Rash, lesions, ulcers: none Induration, erythema: none Nodules: none palpable  EYES Conjunctiva and lids: normal Pupils: equal  EARS, NOSE, MOUTH, THROAT External ears: no lesion or deformity External nose: no lesion or deformity Hearing: grossly normal  NECK Symmetric: yes Trachea: midline Thyroid: no palpable nodules in the thyroid bed  CHEST/CV Not assessed  ABDOMEN Not  assessed  GENITOURINARY/RECTAL Not assessed  MUSCULOSKELETAL Station and gait: normal Digits and nails: no clubbing or cyanosis Muscle strength: grossly normal all extremities Deformity: none  LYMPHATIC Cervical: none palpable Supraclavicular: none palpable  PSYCHIATRIC Oriented to person, place, and time: yes Mood and affect: normal for situation Judgment and insight: appropriate for situation   Assessment and Plan:   Primary hyperparathyroidism (CMS/HHS-HCC)  Patient is referred by her endocrinologist, Dr. Gebreselassie Nida, for surgical evaluation and management of primary hyperparathyroidism.  Patient provided with a copy of Parathyroid  Surgery: Treatment for Your Parathyroid  Gland Problem, published by [employer], 12 pages. Book reviewed and explained to patient during visit today.  Today we reviewed her clinical history. We reviewed her recent laboratory studies. We reviewed her recent imaging studies. It appears she has a right inferior parathyroid  adenoma. We discussed proceeding with minimally invasive parathyroidectomy as an outpatient surgical procedure. We discussed the size and location of the surgical incision. We discussed the risk and benefits of surgery including the risk of recurrent laryngeal nerve injury. We discussed her postoperative recovery and return to normal activities. Patient understands and wishes to proceed.  Patient is on chronic anticoagulation. This has apparently been held for short periods of time for procedures. We would anticipate holding her  medication for 3 days prior to surgery and then resuming it on the day following her procedure.   Krystal Spinner, MD [facility] practice Office: [phone]

## 2024-05-31 ENCOUNTER — Ambulatory Visit (HOSPITAL_BASED_OUTPATIENT_CLINIC_OR_DEPARTMENT_OTHER)

## 2024-05-31 ENCOUNTER — Encounter (HOSPITAL_COMMUNITY)
Admission: RE | Disposition: A | Discharge status: Home / Self Care | Payer: Self-pay | Source: Ambulatory Visit | Attending: Surgery

## 2024-05-31 ENCOUNTER — Ambulatory Visit (HOSPITAL_COMMUNITY)
Admission: RE | Admit: 2024-05-31 | Discharge: 2024-05-31 | Disposition: A | Discharge status: Home / Self Care | Source: Ambulatory Visit | Attending: Surgery | Admitting: Surgery

## 2024-05-31 ENCOUNTER — Other Ambulatory Visit: Payer: Self-pay

## 2024-05-31 ENCOUNTER — Encounter (HOSPITAL_COMMUNITY): Payer: Self-pay | Admitting: Surgery

## 2024-05-31 ENCOUNTER — Encounter (HOSPITAL_COMMUNITY): Payer: Self-pay | Admitting: Medical

## 2024-05-31 DIAGNOSIS — D351 Benign neoplasm of parathyroid gland: Secondary | ICD-10-CM | POA: Insufficient documentation

## 2024-05-31 DIAGNOSIS — M8580 Other specified disorders of bone density and structure, unspecified site: Secondary | ICD-10-CM | POA: Diagnosis not present

## 2024-05-31 DIAGNOSIS — Z86718 Personal history of other venous thrombosis and embolism: Secondary | ICD-10-CM | POA: Diagnosis not present

## 2024-05-31 DIAGNOSIS — E210 Primary hyperparathyroidism: Secondary | ICD-10-CM | POA: Diagnosis present

## 2024-05-31 DIAGNOSIS — I129 Hypertensive chronic kidney disease with stage 1 through stage 4 chronic kidney disease, or unspecified chronic kidney disease: Secondary | ICD-10-CM | POA: Diagnosis not present

## 2024-05-31 DIAGNOSIS — N189 Chronic kidney disease, unspecified: Secondary | ICD-10-CM | POA: Insufficient documentation

## 2024-05-31 DIAGNOSIS — E1122 Type 2 diabetes mellitus with diabetic chronic kidney disease: Secondary | ICD-10-CM

## 2024-05-31 DIAGNOSIS — N1832 Chronic kidney disease, stage 3b: Secondary | ICD-10-CM

## 2024-05-31 DIAGNOSIS — K219 Gastro-esophageal reflux disease without esophagitis: Secondary | ICD-10-CM | POA: Diagnosis not present

## 2024-05-31 DIAGNOSIS — E785 Hyperlipidemia, unspecified: Secondary | ICD-10-CM | POA: Diagnosis not present

## 2024-05-31 DIAGNOSIS — Z85828 Personal history of other malignant neoplasm of skin: Secondary | ICD-10-CM | POA: Diagnosis not present

## 2024-05-31 DIAGNOSIS — M1990 Unspecified osteoarthritis, unspecified site: Secondary | ICD-10-CM | POA: Diagnosis not present

## 2024-05-31 HISTORY — PX: PARATHYROIDECTOMY: SHX19

## 2024-05-31 LAB — GLUCOSE, CAPILLARY
Glucose-Capillary: 115 mg/dL — ABNORMAL HIGH (ref 70–99)
Glucose-Capillary: 111 mg/dL — ABNORMAL HIGH (ref 70–99)
Glucose-Capillary: 115 mg/dL — ABNORMAL HIGH (ref 70–99)

## 2024-05-31 SURGERY — PARATHYROIDECTOMY
Anesthesia: General | Site: Neck | Laterality: Right

## 2024-05-31 MED ORDER — CHLORHEXIDINE GLUCONATE CLOTH 2 % EX PADS: 6.0000 | MEDICATED_PAD | Freq: Once | CUTANEOUS | Status: DC

## 2024-05-31 MED ORDER — DEXAMETHASONE SODIUM PHOSPHATE 10 MG/ML IJ SOLN
INTRAMUSCULAR | Status: AC
  Filled 2024-05-31: qty 1

## 2024-05-31 MED ORDER — DEXMEDETOMIDINE HCL IN NACL 80 MCG/20ML IV SOLN
INTRAVENOUS | Status: DC | PRN
  Administered 2024-05-31: 4 ug via INTRAVENOUS
  Administered 2024-05-31: 8 ug via INTRAVENOUS
  Administered 2024-05-31: 4 ug via INTRAVENOUS

## 2024-05-31 MED ORDER — DROPERIDOL 2.5 MG/ML IJ SOLN: 0.6250 mg | Freq: Once | INTRAMUSCULAR | Status: DC | PRN

## 2024-05-31 MED ORDER — LIDOCAINE HCL (PF) 2 % IJ SOLN
INTRAMUSCULAR | Status: DC | PRN
  Administered 2024-05-31: 1.5 mg/kg/h via INTRADERMAL

## 2024-05-31 MED ORDER — DEXMEDETOMIDINE HCL IN NACL 80 MCG/20ML IV SOLN
INTRAVENOUS | Status: AC
  Filled 2024-05-31: qty 20

## 2024-05-31 MED ORDER — ACETAMINOPHEN 10 MG/ML IV SOLN: 1000.0000 mg | Freq: Once | INTRAVENOUS | Status: DC | PRN

## 2024-05-31 MED ORDER — ONDANSETRON HCL 4 MG/2ML IJ SOLN
INTRAMUSCULAR | Status: AC
  Filled 2024-05-31: qty 2

## 2024-05-31 MED ORDER — OXYCODONE HCL 5 MG PO TABS
5.0000 mg | ORAL_TABLET | Freq: Once | ORAL | Status: AC | PRN
  Administered 2024-05-31: 5 mg via ORAL

## 2024-05-31 MED ORDER — MIDAZOLAM HCL 2 MG/2ML IJ SOLN
INTRAMUSCULAR | Status: AC
  Filled 2024-05-31: qty 2

## 2024-05-31 MED ORDER — LIDOCAINE HCL (PF) 2 % IJ SOLN
INTRAMUSCULAR | Status: AC
  Filled 2024-05-31: qty 5

## 2024-05-31 MED ORDER — TRAMADOL HCL 50 MG PO TABS: 50.0000 mg | ORAL_TABLET | Freq: Four times a day (QID) | ORAL | 0 refills | Status: DC | PRN

## 2024-05-31 MED ORDER — ACETAMINOPHEN 500 MG PO TABS
1000.0000 mg | ORAL_TABLET | Freq: Once | ORAL | Status: AC
  Administered 2024-05-31: 1000 mg via ORAL
  Filled 2024-05-31: qty 2

## 2024-05-31 MED ORDER — PROPOFOL 10 MG/ML IV BOLUS
INTRAVENOUS | Status: AC
  Filled 2024-05-31: qty 20

## 2024-05-31 MED ORDER — DEXAMETHASONE SODIUM PHOSPHATE 10 MG/ML IJ SOLN
INTRAMUSCULAR | Status: DC | PRN
  Administered 2024-05-31: 5 mg via INTRAVENOUS

## 2024-05-31 MED ORDER — SUGAMMADEX SODIUM 200 MG/2ML IV SOLN
INTRAVENOUS | Status: DC | PRN
  Administered 2024-05-31: 340 mg via INTRAVENOUS

## 2024-05-31 MED ORDER — LIDOCAINE HCL (CARDIAC) PF 100 MG/5ML IV SOSY
PREFILLED_SYRINGE | INTRAVENOUS | Status: DC | PRN
  Administered 2024-05-31: 60 mg via INTRAVENOUS

## 2024-05-31 MED ORDER — BUPIVACAINE HCL 0.25 % IJ SOLN
INTRAMUSCULAR | Status: DC | PRN
  Administered 2024-05-31: 10 mL

## 2024-05-31 MED ORDER — PROPOFOL 10 MG/ML IV BOLUS
INTRAVENOUS | Status: DC | PRN
  Administered 2024-05-31: 150 mg via INTRAVENOUS

## 2024-05-31 MED ORDER — ROCURONIUM BROMIDE 10 MG/ML (PF) SYRINGE
PREFILLED_SYRINGE | INTRAVENOUS | Status: AC
  Filled 2024-05-31: qty 10

## 2024-05-31 MED ORDER — LIDOCAINE HCL 2 % IJ SOLN
INTRAMUSCULAR | Status: AC
  Filled 2024-05-31: qty 20

## 2024-05-31 MED ORDER — STERILE WATER FOR IRRIGATION IR SOLN
Status: DC | PRN
  Administered 2024-05-31: 1000 mL

## 2024-05-31 MED ORDER — 0.9 % SODIUM CHLORIDE (POUR BTL) OPTIME
TOPICAL | Status: DC | PRN
  Administered 2024-05-31: 1000 mL

## 2024-05-31 MED ORDER — LACTATED RINGERS IV SOLN: INTRAVENOUS | Status: DC

## 2024-05-31 MED ORDER — ONDANSETRON HCL 4 MG/2ML IJ SOLN
INTRAMUSCULAR | Status: DC | PRN
  Administered 2024-05-31: 4 mg via INTRAVENOUS

## 2024-05-31 MED ORDER — FENTANYL CITRATE (PF) 100 MCG/2ML IJ SOLN
INTRAMUSCULAR | Status: AC
  Filled 2024-05-31: qty 2

## 2024-05-31 MED ORDER — BUPIVACAINE HCL (PF) 0.25 % IJ SOLN
INTRAMUSCULAR | Status: AC
  Filled 2024-05-31: qty 30

## 2024-05-31 MED ORDER — ROCURONIUM BROMIDE 100 MG/10ML IV SOLN
INTRAVENOUS | Status: DC | PRN
Start: 2024-05-31 — End: 2024-05-31
  Administered 2024-05-31: 60 mg via INTRAVENOUS

## 2024-05-31 MED ORDER — CEFAZOLIN SODIUM-DEXTROSE 2-4 GM/100ML-% IV SOLN
2.0000 g | INTRAVENOUS | Status: AC
  Administered 2024-05-31: 2 g via INTRAVENOUS
  Filled 2024-05-31: qty 100

## 2024-05-31 MED ORDER — CHLORHEXIDINE GLUCONATE 0.12 % MT SOLN: 15.0000 mL | Freq: Once | OROMUCOSAL | Status: DC

## 2024-05-31 MED ORDER — FENTANYL CITRATE PF 50 MCG/ML IJ SOSY: 25.0000 ug | PREFILLED_SYRINGE | INTRAMUSCULAR | Status: DC | PRN

## 2024-05-31 MED ORDER — MIDAZOLAM HCL 5 MG/5ML IJ SOLN
INTRAMUSCULAR | Status: DC | PRN
  Administered 2024-05-31: 2 mg via INTRAVENOUS

## 2024-05-31 MED ORDER — HEMOSTATIC AGENTS (NO CHARGE) OPTIME
TOPICAL | Status: DC | PRN
  Administered 2024-05-31: 1 via TOPICAL

## 2024-05-31 MED ORDER — PHENYLEPHRINE HCL-NACL 20-0.9 MG/250ML-% IV SOLN
INTRAVENOUS | Status: DC | PRN
Start: 2024-05-31 — End: 2024-05-31
  Administered 2024-05-31: 25 ug/min via INTRAVENOUS
  Administered 2024-05-31: 80 ug via INTRAVENOUS

## 2024-05-31 MED ORDER — OXYCODONE HCL 5 MG/5ML PO SOLN: 5.0000 mg | Freq: Once | ORAL | Status: AC | PRN

## 2024-05-31 MED ORDER — FENTANYL CITRATE (PF) 100 MCG/2ML IJ SOLN
INTRAMUSCULAR | Status: DC | PRN
  Administered 2024-05-31: 100 ug via INTRAVENOUS

## 2024-05-31 MED ORDER — SUGAMMADEX SODIUM 200 MG/2ML IV SOLN
INTRAVENOUS | Status: AC
Start: 2024-05-31 — End: 2024-05-31
  Filled 2024-05-31: qty 4

## 2024-05-31 MED ORDER — OXYCODONE HCL 5 MG PO TABS
ORAL_TABLET | ORAL | Status: AC
  Filled 2024-05-31: qty 1

## 2024-05-31 MED ORDER — ORAL CARE MOUTH RINSE: 15.0000 mL | Freq: Once | OROMUCOSAL | Status: DC

## 2024-05-31 SURGICAL SUPPLY — 29 items
ATTRACTOMAT 16X20 MAGNETIC DRP (DRAPES) ×1 IMPLANT
BAG COUNTER SPONGE SURGICOUNT (BAG) ×1 IMPLANT
BLADE SURG 15 STRL LF DISP TIS (BLADE) ×1 IMPLANT
CHLORAPREP W/TINT 26 (MISCELLANEOUS) ×1 IMPLANT
CLIP TI MEDIUM 6 (CLIP) ×2 IMPLANT
CLIP TI WIDE RED SMALL 6 (CLIP) ×2 IMPLANT
COVER SURGICAL LIGHT HANDLE (MISCELLANEOUS) ×1 IMPLANT
DERMABOND ADVANCED .7 DNX12 (GAUZE/BANDAGES/DRESSINGS) ×1 IMPLANT
DRAPE LAPAROTOMY T 98X78 PEDS (DRAPES) ×1 IMPLANT
DRAPE UTILITY XL STRL (DRAPES) ×1 IMPLANT
ELECT PENCIL ROCKER SW 15FT (MISCELLANEOUS) ×1 IMPLANT
ELECT REM PT RETURN 15FT ADLT (MISCELLANEOUS) ×1 IMPLANT
GAUZE 4X4 16PLY ~~LOC~~+RFID DBL (SPONGE) ×1 IMPLANT
GLOVE SURG ORTHO 8.0 STRL STRW (GLOVE) ×1 IMPLANT
GOWN STRL REUS W/ TWL XL LVL3 (GOWN DISPOSABLE) ×3 IMPLANT
HEMOSTAT SURGICEL 2X4 FIBR (HEMOSTASIS) ×1 IMPLANT
ILLUMINATOR WAVEGUIDE N/F (MISCELLANEOUS) IMPLANT
KIT BASIN OR (CUSTOM PROCEDURE TRAY) ×1 IMPLANT
KIT TURNOVER KIT A (KITS) ×1 IMPLANT
NDL HYPO 22X1.5 SAFETY MO (MISCELLANEOUS) ×1 IMPLANT
NEEDLE HYPO 22X1.5 SAFETY MO (MISCELLANEOUS) ×1 IMPLANT
PACK BASIC VI WITH GOWN DISP (CUSTOM PROCEDURE TRAY) ×1 IMPLANT
SHEARS HARMONIC 9CM CVD (BLADE) IMPLANT
SUT MNCRL AB 4-0 PS2 18 (SUTURE) ×1 IMPLANT
SUT VIC AB 3-0 SH 18 (SUTURE) ×1 IMPLANT
SYR BULB IRRIG 60ML STRL (SYRINGE) ×1 IMPLANT
SYR CONTROL 10ML LL (SYRINGE) ×1 IMPLANT
TOWEL OR 17X26 10 PK STRL BLUE (TOWEL DISPOSABLE) ×1 IMPLANT
TUBING CONNECTING 10 (TUBING) ×1 IMPLANT

## 2024-05-31 NOTE — Anesthesia Procedure Notes (Signed)
 Procedure Name: Intubation Date/Time: 05/31/2024 12:55 PM  Performed by: Belvie Valri NOVAK, CRNAPre-anesthesia Checklist: Patient identified, Emergency Drugs available, Suction available and Patient being monitored Patient Re-evaluated:Patient Re-evaluated prior to induction Oxygen Delivery Method: Circle System Utilized Preoxygenation: Pre-oxygenation with 100% oxygen Induction Type: IV induction Ventilation: Mask ventilation without difficulty and Oral airway inserted - appropriate to patient size Laryngoscope Size: Mac and 3 Grade View: Grade I Tube type: Oral Tube size: 7.0 mm Number of attempts: 1 Airway Equipment and Method: Stylet and Oral airway Placement Confirmation: ETT inserted through vocal cords under direct vision, positive ETCO2 and breath sounds checked- equal and bilateral Secured at: 22 cm Tube secured with: Tape Dental Injury: Teeth and Oropharynx as per pre-operative assessment

## 2024-05-31 NOTE — Discharge Instructions (Addendum)

## 2024-05-31 NOTE — Interval H&P Note (Signed)
 History and Physical Interval Note:  05/31/2024 11:59 AM  Joy Leach  has presented today for surgery, with the diagnosis of PRIMARY HYPERPARATHYROIDISM.  The various methods of treatment have been discussed with the patient and family. After consideration of risks, benefits and other options for treatment, the patient has consented to    Procedure(s) with comments: PARATHYROIDECTOMY (Right) - RIGHT INFERIOR PARATHYROIDECTOMY as a surgical intervention.    The patient's history has been reviewed, patient examined, no change in status, stable for surgery.  I have reviewed the patient's chart and labs.  Questions were answered to the patient's satisfaction.    Krystal Spinner, MD [facility] practice Office: [phone]   Krystal Spinner

## 2024-05-31 NOTE — Transfer of Care (Signed)
 Immediate Anesthesia Transfer of Care Note  Patient: Joy Leach  Procedure(s) Performed: PARATHYROIDECTOMY (Right: Neck)  Patient Location: PACU  Anesthesia Type:General  Level of Consciousness: awake  Airway & Oxygen Therapy: Patient Spontanous Breathing and Patient connected to face mask oxygen  Post-op Assessment: Report given to RN and Post -op Vital signs reviewed and stable  Post vital signs: Reviewed and stable  Last Vitals:  Vitals Value Taken Time  BP 122/67 05/31/24 14:12  Temp 36.5 C 05/31/24 14:12  Pulse 67 05/31/24 14:16  Resp 23 05/31/24 14:16  SpO2 94 % 05/31/24 14:16  Vitals shown include unfiled device data.  Last Pain:  Vitals:   05/31/24 1056  TempSrc: Oral  PainSc:          Complications: No notable events documented.

## 2024-05-31 NOTE — Anesthesia Postprocedure Evaluation (Signed)
 Anesthesia Post Note  Patient: Joy Leach  Procedure(s) Performed: PARATHYROIDECTOMY (Right: Neck)     Patient location during evaluation: PACU Anesthesia Type: General Level of consciousness: awake and alert Pain management: pain level controlled Vital Signs Assessment: post-procedure vital signs reviewed and stable Respiratory status: spontaneous breathing, nonlabored ventilation, respiratory function stable and patient connected to nasal cannula oxygen Cardiovascular status: blood pressure returned to baseline and stable Postop Assessment: no apparent nausea or vomiting Anesthetic complications: no   No notable events documented.  Last Vitals:  Vitals:   05/31/24 1515 05/31/24 1600  BP: 127/75 111/61  Pulse: 66 68  Resp: 20 18  Temp:    SpO2: (!) 89% 93%    Last Pain:  Vitals:   05/31/24 1600  TempSrc:   PainSc: 3                  Thom JONELLE Peoples

## 2024-05-31 NOTE — Anesthesia Preprocedure Evaluation (Addendum)
 Anesthesia Evaluation  Patient identified by MRN, date of birth, ID band Patient awake    Reviewed: Allergy & Precautions, H&P , NPO status , Patient's Chart, lab work & pertinent test results  History of Anesthesia Complications Negative for: history of anesthetic complications  Airway Mallampati: II  TM Distance: >3 FB Neck ROM: Full    Dental no notable dental hx. (+) Poor Dentition   Pulmonary neg pulmonary ROS, neg sleep apnea   Pulmonary exam normal breath sounds clear to auscultation       Cardiovascular hypertension, + Peripheral Vascular Disease and + DVT  (-) Past MI Normal cardiovascular exam Rhythm:Regular Rate:Normal     Neuro/Psych negative neurological ROS  negative psych ROS   GI/Hepatic Neg liver ROS,GERD  ,,  Endo/Other  negative endocrine ROSdiabetes, Type 2    Renal/GU Renal InsufficiencyRenal disease  negative genitourinary   Musculoskeletal negative musculoskeletal ROS (+) Arthritis ,    Abdominal   Peds negative pediatric ROS (+)  Hematology  (+) Blood dyscrasia, anemia   Anesthesia Other Findings SLE  Reproductive/Obstetrics negative OB ROS                              Anesthesia Physical Anesthesia Plan  ASA: 3  Anesthesia Plan: General   Post-op Pain Management: Tylenol  PO (pre-op)*   Induction: Intravenous  PONV Risk Score and Plan: 3 and Ondansetron , Dexamethasone  and Treatment may vary due to age or medical condition  Airway Management Planned: Oral ETT  Additional Equipment: None  Intra-op Plan:   Post-operative Plan: Extubation in OR  Informed Consent: I have reviewed the patients History and Physical, chart, labs and discussed the procedure including the risks, benefits and alternatives for the proposed anesthesia with the patient or authorized representative who has indicated his/her understanding and acceptance.     Dental advisory  given  Plan Discussed with: CRNA  Anesthesia Plan Comments:          Anesthesia Quick Evaluation

## 2024-05-31 NOTE — Op Note (Signed)
 OPERATIVE REPORT - PARATHYROIDECTOMY  Preoperative diagnosis: Primary hyperparathyroidism  Postop diagnosis: Same  Procedure: Right minimally invasive parathyroidectomy  Surgeon:  Krystal Spinner, MD  Anesthesia: General endotracheal  Estimated blood loss: Minimal  Preparation: ChloraPrep  Indications: Patient is referred by Dr. Gebreselassie Nida for surgical evaluation and management of newly diagnosed primary hyperparathyroidism. Patient had been noted on routine laboratory studies to have an elevated serum calcium level. Her most recent level was elevated at 11.2. Intact PTH level was elevated at 76. 25-hydroxy vitamin D  level was normal at 48. Patient subsequently underwent a nuclear medicine parathyroid  scan which localized a right inferior parathyroid  adenoma. This was also apparent on an unrelated CT scan performed in February 2025. Patient is symptomatic. She notes chronic fatigue. She has bone and joint discomfort. She has a history of nephrolithiasis. Patient also complains of GI symptoms including abdominal pain and constipation. Patient has had no prior head or neck surgery. There is a family history of benign thyroid disease but no family history of parathyroid  disease or other endocrine neoplasms. Patient presents today accompanied by her sisters to discuss parathyroid  surgery.   Procedure: The patient was prepared in the pre-operative holding area. The patient was brought to the operating room and placed in a supine position on the operating room table. Following administration of general anesthesia, the patient was positioned and then prepped and draped in the usual strict aseptic fashion. After ascertaining that an adequate level of anesthesia been achieved, a neck incision was made with a #15 blade. Dissection was carried through subcutaneous tissues and platysma. Hemostasis was obtained with the electrocautery. Skin flaps were developed circumferentially and a Weitlander retractor  was placed for exposure.  Strap muscles were incised in the midline. Strap muscles were reflected laterally exposing the thyroid lobe. With gentle blunt dissection the right thyroid lobe was mobilized.  Dissection was carried posteriorly and an enlarged parathyroid  gland was identified well posterior adjacent to the esophagus. It was gently mobilized. Vascular structures were divided between small ligaclips. Care was taken to avoid the recurrent laryngeal nerve. The parathyroid  gland was completely excised. It was submitted to pathology where frozen section confirmed hypercellular parathyroid  tissue consistent with adenoma.  Neck was irrigated with warm saline and good hemostasis was noted. Fibrillar was placed in the operative field. Strap muscles were approximated in the midline with interrupted 3-0 Vicryl sutures. Platysma was closed with interrupted 3-0 Vicryl sutures. Marcaine  was infiltrated circumferentially. Skin was closed with a running 4-0 Monocryl subcuticular suture. Wound was washed and dried and Dermabond was applied. Patient was awakened from anesthesia and brought to the recovery room. The patient tolerated the procedure well.   Krystal Spinner, MD [facility] Surgery Office: [phone]

## 2024-06-01 ENCOUNTER — Encounter (HOSPITAL_COMMUNITY): Payer: Self-pay | Admitting: Surgery

## 2024-06-01 LAB — SURGICAL PATHOLOGY

## 2024-06-03 ENCOUNTER — Ambulatory Visit (INDEPENDENT_AMBULATORY_CARE_PROVIDER_SITE_OTHER): Admitting: Gastroenterology

## 2024-06-21 ENCOUNTER — Encounter (INDEPENDENT_AMBULATORY_CARE_PROVIDER_SITE_OTHER): Payer: Self-pay | Admitting: Gastroenterology

## 2024-07-11 LAB — T4, FREE: Free T4: 1.38 ng/dL (ref 0.82–1.77)

## 2024-07-11 LAB — PTH, INTACT AND CALCIUM
Calcium: 8.8 mg/dL (ref 8.7–10.3)
PTH: 26 pg/mL (ref 15–65)

## 2024-07-11 LAB — TSH: TSH: 3.91 u[IU]/mL (ref 0.450–4.500)

## 2024-07-14 ENCOUNTER — Encounter: Payer: Self-pay | Admitting: "Endocrinology

## 2024-07-14 ENCOUNTER — Ambulatory Visit: Admitting: "Endocrinology

## 2024-07-14 VITALS — BP 108/66 | HR 72 | Ht 65.0 in | Wt 193.8 lb

## 2024-07-14 DIAGNOSIS — E782 Mixed hyperlipidemia: Secondary | ICD-10-CM | POA: Diagnosis not present

## 2024-07-14 DIAGNOSIS — E212 Other hyperparathyroidism: Secondary | ICD-10-CM

## 2024-07-14 DIAGNOSIS — M8580 Other specified disorders of bone density and structure, unspecified site: Secondary | ICD-10-CM

## 2024-07-14 DIAGNOSIS — E119 Type 2 diabetes mellitus without complications: Secondary | ICD-10-CM

## 2024-07-14 DIAGNOSIS — Z7984 Long term (current) use of oral hypoglycemic drugs: Secondary | ICD-10-CM

## 2024-07-14 NOTE — Progress Notes (Signed)
 07/14/2024, 1:23 PM  Endocrinology follow-up note  Joy Leach is a 68 y.o.-year-old female, referred by her  Francis Alan CROME, FNP  , for evaluation for hypercalcemia/hyperparathyroidism.   Past Medical History:  Diagnosis Date   Anemia    Basal cell carcinoma (BCC) of skin of face 10/2023   left side   Chronic kidney disease    stage 3   Diabetes mellitus without complication (HCC)    Dyspnea    Fatty (change of) liver, not elsewhere classified    H/O blood clots    Headache    History of hypertension    Hyperlipidemia    Hypertension    Lupus    Mycobacterial disease    Osteoarthritis    Osteopenia    Peripheral vascular disease    Pneumonia    Sicca     Past Surgical History:  Procedure Laterality Date   ABDOMINAL HYSTERECTOMY     BIOPSY  10/26/2021   Procedure: BIOPSY;  Surgeon: Eartha Angelia Sieving, MD;  [facility];  Service: Gastroenterology;;   BIOPSY  04/02/2022   Procedure: BIOPSY;  Surgeon: Eartha Angelia Sieving, MD;  [facility];  Service: Gastroenterology;;   CHOLECYSTECTOMY N/A 03/07/2023   Procedure: LAPAROSCOPIC CHOLECYSTECTOMY;  Surgeon: Evonnie Dorothyann LABOR, DO;  Location: [facility];  Service: General;  Laterality: N/A;   COLONOSCOPY  05/26/2015   Dr donnel, mild diverticulosis in sigmoid colon, bios[facility] and sigmoid colon-negative   COLONOSCOPY WITH PROPOFOL  N/A 04/02/2022   Procedure: COLONOSCOPY WITH PROPOFOL ;  Surgeon: Eartha Angelia Sieving, MD;  [facility];  Service: Gastroenterology;  Laterality: N/A;  815   CYSTOSCOPY WITH RETROGRADE PYELOGRAM, URETEROSCOPY AND STENT PLACEMENT Right 01/30/2023   Procedure: CYSTOSCOPY WITH RETROGRADE PYELOGRAM, URETEROSCOPY AND STENT PLACEMENT;  Surgeon: Sherrilee Belvie CROME, MD;  Location: [facility];  Service: Urology;  Laterality: Right;   ESOPHAGOGASTRODUODENOSCOPY (EGD) WITH PROPOFOL  N/A  10/26/2021   Procedure: ESOPHAGOGASTRODUODENOSCOPY (EGD) WITH PROPOFOL ;  Surgeon: Eartha Angelia Sieving, MD;  [facility];  Service: Gastroenterology;  Laterality: N/A;  1045   EXTRACORPOREAL SHOCK WAVE LITHOTRIPSY Right 12/03/2022   Procedure: EXTRACORPOREAL SHOCK WAVE LITHOTRIPSY (ESWL);  Surgeon: Sherrilee Belvie CROME, MD;  Location: [facility];  Service: Urology;  Laterality: Right;   EYE SURGERY     HEMOSTASIS CLIP PLACEMENT  10/26/2021   Procedure: HEMOSTASIS CLIP PLACEMENT;  Surgeon: Eartha Angelia Sieving, MD;  [facility];  Service: Gastroenterology;;   HOLMIUM LASER APPLICATION Right 01/30/2023   Procedure: HOLMIUM LASER APPLICATION;  Surgeon: Sherrilee Belvie CROME, MD;  Location: [facility];  Service: Urology;  Laterality: Right;   HOT HEMOSTASIS  10/26/2021   Procedure: HOT HEMOSTASIS (ARGON PLASMA COAGULATION/BICAP);  Surgeon: Eartha Angelia, Sieving, MD;  [facility];  Service: Gastroenterology;;   hystorectomy     PARATHYROIDECTOMY Right 05/31/2024   Procedure: PARATHYROIDECTOMY;  Surgeon: Eletha Boas, MD;  Location: [facility];  Service: General;  Laterality: Right;  RIGHT INFERIOR PARATHYROIDECTOMY   POLYPECTOMY  10/26/2021   Procedure: POLYPECTOMY;  Surgeon: Eartha Angelia Sieving, MD;  [facility];  Service: Gastroenterology;;   POLYPECTOMY  04/02/2022  Procedure: POLYPECTOMY;  Surgeon: Eartha Flavors, Toribio, MD;  [facility];  Service: Gastroenterology;;   SINUS SURGERY WITH INSTATRAK  2015   SKIN BIOPSY      Social History   Tobacco Use   Smoking status: Never    Passive exposure: Never   Smokeless tobacco: Never  Vaping Use   Vaping status: Never Used  Substance Use Topics   Alcohol  use: No   Drug use: No    Family History  Problem Relation Age of Onset   Hypertension Mother    Diabetes Mother    Kidney disease Mother    Heart disease Mother    High Cholesterol Mother    Lung cancer Mother     Hypertension Father    Diabetes Father    Kidney disease Father    Heart disease Father     Outpatient Encounter Medications as of 07/14/2024  Medication Sig   acetaminophen  (TYLENOL ) 500 MG tablet Take 2 tablets (1,000 mg total) by mouth every 8 (eight) hours as needed for mild pain, fever or headache.   apixaban  (ELIQUIS ) 5 MG TABS tablet Take 1 tablet (5 mg total) by mouth 2 (two) times daily. To be resume at the moment of discharge and after all procedures completed.   azelastine (ASTELIN) 0.1 % nasal spray Place 1 spray into both nostrils daily.   Cinnamon 500 MG capsule Take 500 mg by mouth daily.   dicyclomine  (BENTYL ) 10 MG capsule Take 1 capsule (10 mg total) by mouth every 8 (eight) hours as needed (abdominal pain or diarrhea).   ezetimibe (ZETIA) 10 MG tablet Take 10 mg by mouth daily.   fenofibrate (TRICOR) 145 MG tablet Take 145 mg by mouth every evening.   ferrous sulfate  325 (65 FE) MG EC tablet Take 1 tablet (325 mg total) by mouth daily with breakfast. Resume at time of discharge   furosemide  (LASIX ) 20 MG tablet TAKE (1) TABLET BY MOUTH ONCE DAILY AS NEEDED.   hydrocortisone  (ANUSOL -HC) 2.5 % rectal cream Place 1 Application rectally 4 (four) times daily. Use four times per day x10 days then PRN thereafter (Patient taking differently: Place 1 Application rectally daily as needed for hemorrhoids or anal itching.)   levocetirizine (XYZAL) 5 MG tablet Take 5 mg by mouth every evening.   loperamide (IMODIUM A-D) 2 MG tablet Take 2 mg by mouth 2 (two) times daily as needed for diarrhea or loose stools.   metFORMIN (GLUCOPHAGE) 1000 MG tablet Take 1,000 mg by mouth 2 (two) times daily.   montelukast (SINGULAIR) 10 MG tablet Take 10 mg by mouth daily.   Multiple Vitamin (MULTIVITAMIN) tablet Take 1 tablet by mouth in the morning.   ondansetron  (ZOFRAN ) 4 MG tablet Take 1 tablet (4 mg total) by mouth every 8 (eight) hours as needed for nausea or vomiting.   pantoprazole  (PROTONIX ) 40  MG tablet TAKE ONE TABLET BY MOUTH EVERY DAY   pioglitazone (ACTOS) 15 MG tablet Take 15 mg by mouth daily.   polyvinyl alcohol  (LIQUIFILM TEARS) 1.4 % ophthalmic solution Place 1 drop into both eyes as needed for dry eyes.   predniSONE  5 MG TBEC Take 5 mg by mouth daily with breakfast.   rifaximin  (XIFAXAN ) 550 MG TABS tablet Take 1 tablet (550 mg total) by mouth 3 (three) times daily.   traMADol  (ULTRAM ) 50 MG tablet Take 1-2 tablets (50-100 mg total) by mouth every 6 (six) hours as needed for moderate pain (pain score 4-6).   Turmeric (QC  TUMERIC COMPLEX PO) Take 1 capsule by mouth daily.   No facility-administered encounter medications on file as of 07/14/2024.    Allergies  Allergen Reactions   Sulfa Antibiotics Hives   Caffeine Other (See Comments)    Bladder infections   Codeine Nausea And Vomiting     HPI  Joy Leach was diagnosed with elevated PTH of 88 on December 06, 2022 associated with mild hypercalcemia.   After workup confirmed primary hyperparathyroidism, she was sent  to Dr. Eletha for parathyroidectomy.   She underwent right inferior parathyroidectomy on May 31, 2024 which revealed hypercellular gland consistent with parathyroid  adenoma. Her subsequent labs showed normalization of her calcium and PTH.  She has no new complaints.  She has recovered from her surgery very well.   Patient has no previously known history of  pituitary, adrenal dysfunctions; no family history of such dysfunctions. She did have a recent bone density at her rheumatologist.  T-score of the hips were -2.0 consistent with osteopenia.  She is accompanied by her sister to clinic.    She is on maintenance vitamin D  currently replete at 48.  No prior history of fragility fractures or falls.  She is unsure of her remote past history of nephrolithiasis. - She does not report any height loss. She does not control bladder, wears pads and reporting urge incontinence.   she is not on HCTZ or  other thiazide therapy.   She has type 2 diabetes on Actos, Janumet.  She also has hyperlipidemia on Tricor. she is not on calcium supplements,  she eats dairy and green, leafy, vegetables on average amounts.  she does not have a family history of hypercalcemia, pituitary tumors, thyroid cancer, or osteoporosis.    ROS:  Constitutional: + Minimally fluctuating body weight,  no fatigue, no subjective hyperthermia, no subjective hypothermia Eyes: no blurry vision, no xerophthalmia   PE: BP 108/66   Pulse 72   Ht 5' 5 (1.651 m)   Wt 193 lb 12.8 oz (87.9 kg)   BMI 32.25 kg/m , Body mass index is 32.25 kg/m. Wt Readings from Last 3 Encounters:  07/14/24 193 lb 12.8 oz (87.9 kg)  05/31/24 187 lb (84.8 kg)  05/26/24 187 lb (84.8 kg)    Constitutional: + BMI of 30.19, not in acute distress, normal state of mind Eyes: PERRLA, EOMI, no exophthalmos ENT: moist mucous membranes, no gross thyromegaly, no gross cervical lymphadenopathy    CMP ( most recent) CMP     Component Value Date/Time   NA 136 05/26/2024 1107   NA 142 03/30/2024 1055   K 3.9 05/26/2024 1107   CL 103 05/26/2024 1107   CO2 25 05/26/2024 1107   GLUCOSE 156 (H) 05/26/2024 1107   BUN 28 (H) 05/26/2024 1107   BUN 22 03/30/2024 1055   CREATININE 1.35 (H) 05/26/2024 1107   CREATININE 1.06 (H) 08/20/2016 1422   CALCIUM 8.8 07/09/2024 1059   PROT 5.9 (L) 03/30/2024 1055   ALBUMIN 4.2 03/30/2024 1055   AST 18 03/30/2024 1055   ALT 17 03/30/2024 1055   ALKPHOS 70 03/30/2024 1055   BILITOT 0.4 03/30/2024 1055   GFRNONAA 43 (L) 05/26/2024 1107   GFRAA 73 03/11/2018 1034      Lab Results  Component Value Date   TSH 3.910 07/09/2024   TSH 1.13 07/12/2016   FREET4 1.38 07/09/2024    Recent Results (from the past 2160 hours)  C. difficile GDH and Toxin A/B     Status: None  Collection Time: 05/05/24 11:34 AM  Result Value Ref Range   MICRO NUMBER: [id number]    SPECIMEN QUALITY: Adequate    Source STOOL     STATUS: FINAL    GDH ANTIGEN Not Detected    TOXIN A AND B Not Detected    COMMENT      No toxigenic C. difficile detected For additional information, please refer to http://education.[facility].com/faq/FAQ136 (This link is being provided for informational/educational purposes only.)  Gastrointestinal Pathogen Pnl RT, PCR     Status: None   Collection Time: 05/05/24 11:34 AM  Result Value Ref Range   CampyloBacter Group NOT DETECTED NOT DETECTED   Salmonella species NOT DETECTED NOT DETECTED   Shigella Species NOT DETECTED NOT DETECTED   Vibrio Group NOT DETECTED NOT DETECTED   Yersinia enterocolitica NOT DETECTED NOT DETECTED   Shiga Toxin 1 NOT DETECTED NOT DETECTED   Shiga Toxin 2 NOT DETECTED NOT DETECTED   Norovirus GI/GII NOT DETECTED NOT DETECTED   Rotavirus A NOT DETECTED NOT DETECTED    Comment: Organisms included in the Campylobacter group include C. coli, C. jejuni, and C. lari. Organisms included in the Shigella species include S. dysenteriae, S. boydii, S. sonnei, and S. flexneri. Organisms included in the Vibrio group include V. cholerae and V. parahaemolyticus.   Ova and parasite examination     Status: None   Collection Time: 05/05/24 11:34 AM   Specimen: Stool  Result Value Ref Range   MICRO NUMBER: [id number]    SPECIMEN QUALITY: Adequate    Source STOOL    STATUS: FINAL    CONCENTRATE RESULT: No ova or parasites seen    TRICHROME RESULT: No ova or parasites seen    COMMENT:      Routine Ova and Parasite exam may not detect some parasites that occasionally cause diarrheal illness. Cryptosporidium Antigen and/or Cyclospora and Isospora Exam may be ordered to detect these parasites. One negative sample does not necessarily rule out  the presence of a parasitic infection.  For additional information, please refer to https://education.[facility].com/faq/FAQ203 (This link is being provided for informational/ educational purposes only.)   Glucose, capillary      Status: Abnormal   Collection Time: 05/26/24 10:58 AM  Result Value Ref Range   Glucose-Capillary 165 (H) 70 - 99 mg/dL    Comment: Glucose reference range applies only to samples taken after fasting for at least 8 hours.  Hemoglobin A1c per protocol     Status: Abnormal   Collection Time: 05/26/24 11:07 AM  Result Value Ref Range   Hgb A1c MFr Bld 5.8 (H) 4.8 - 5.6 %    Comment: (NOTE)         Prediabetes: 5.7 - 6.4         Diabetes: >6.4         Glycemic control for adults with diabetes: <7.0    Mean Plasma Glucose 120 mg/dL    Comment: (NOTE) Performed At: [facility] [address] [place], [place] [id number] Jennette Shorter MD Ey:[phone]   Basic metabolic panel per protocol     Status: Abnormal   Collection Time: 05/26/24 11:07 AM  Result Value Ref Range   Sodium 136 135 - 145 mmol/L   Potassium 3.9 3.5 - 5.1 mmol/L   Chloride 103 98 - 111 mmol/L   CO2 25 22 - 32 mmol/L   Glucose, Bld 156 (H) 70 - 99 mg/dL    Comment: Glucose reference range applies only to samples taken after fasting for  at least 8 hours.   BUN 28 (H) 8 - 23 mg/dL   Creatinine, Ser 8.64 (H) 0.44 - 1.00 mg/dL   Calcium 89.0 (H) 8.9 - 10.3 mg/dL   GFR, Estimated 43 (L) >60 mL/min    Comment: (NOTE) Calculated using the CKD-EPI Creatinine Equation (2021)    Anion gap 8 5 - 15    Comment: Performed at [facility], [address] [place], [place] [address]  CBC per protocol     Status: Abnormal   Collection Time: 05/26/24 11:07 AM  Result Value Ref Range   WBC 7.7 4.0 - 10.5 K/uL   RBC 4.51 3.87 - 5.11 MIL/uL   Hemoglobin 12.4 12.0 - 15.0 g/dL   HCT 58.4 63.9 - 53.9 %   MCV 92.0 80.0 - 100.0 fL   MCH 27.5 26.0 - 34.0 pg   MCHC 29.9 (L) 30.0 - 36.0 g/dL   RDW 85.1 88.4 - 84.4 %   Platelets 252 150 - 400 K/uL   nRBC 0.0 0.0 - 0.2 %    Comment: Performed at [facility], [address], [place], [place] [address]  Glucose, capillary     Status:  Abnormal   Collection Time: 05/31/24 11:01 AM  Result Value Ref Range   Glucose-Capillary 115 (H) 70 - 99 mg/dL    Comment: Glucose reference range applies only to samples taken after fasting for at least 8 hours.   Comment 1 Notify RN    Comment 2 Document in Chart   Surgical pathology     Status: None   Collection Time: 05/31/24  1:26 PM  Result Value Ref Range   SURGICAL PATHOLOGY      SURGICAL PATHOLOGY CASE: [id number] PATIENT: Joy Leach Surgical Pathology Report     Clinical History: primary hyperparathyroidism (tb)     FINAL MICROSCOPIC DIAGNOSIS:  A. RIGHT PARATHYROID  ADENOMA, EXCISION:      Hypercellular parathyroid  tissue compatible with parathyroid  adenoma.      No evidence of atypia or malignancy.      See comment.  COMMENT:  The case was peer-reviewed by Dr. Reed who agrees with the diagnosis.   INTRAOPERATIVE DIAGNOSIS:  A1. RIGHT PARATHYROID  ADENOMA, FROZEN SECTION:  Hypercellular parathyroid  tissue (0.769 g), suggestive of an adenoma. Rapid intraoperative consult diagnosis called to Dr. Eletha by Dr. Rebbecca @ 1348 05/31/2024.   GROSS DESCRIPTION:  Received fresh for rapid intraoperative consult evaluation by frozen section is a 0.769 g, 1.6 cm tan-red soft to indurated nodule which is bisected and entirely submitted in 1 block for frozen section.  SW 05/31/2024   Final Diagnosis performed by Darrall pamula Dutton, MD.   Electronically signed 06/01/2024 Technical component performed at [facility], [address], [place], [place] [address].  Professional component performed at [employer], [address] [place], [place] [address].  Immunohistochemistry Technical component (if applicable) was performed at [facility]. [address], [place], [place] [address].   IMMUNOHISTOCHEMISTRY DISCLAIMER (if applicable): Some of these immunohistochemical stains may have been  developed and the performance characteristics determine by [facility]. Some may not have been cleared or approved by the U.S. Food and Drug Administration. The FDA has determined that such clearance or approval is not necessary. This test is used for clinical purposes. It should not be regarded as investigational or for research. This laboratory is certified under the Clinical Laboratory Improvement Amendments of 1988 (CLIA-88) as qu alified  to perform high complexity clinical laboratory testing.  The controls stained appropriately.   IHC stains are performed on formalin fixed, paraffin embedded tissue using a 3,3diaminobenzidine (DAB) chromogen and Leica Bond Autostainer System. The staining intensity of the nucleus is score manually and is reported as the percentage of tumor cell nuclei demonstrating specific nuclear staining. The specimens are fixed in 10% Neutral Formalin for at least 6 hours and up to 72hrs. These tests are validated on decalcified tissue. Results should be interpreted with caution given the possibility of false negative results on decalcified specimens. Antibody Clones are as follows ER-clone [id number], PR-clone 16, Ki67- clone MM1. Some of these immunohistochemical stains may have been developed and the performance characteristics determined by [facility].   Glucose, capillary     Status: Abnormal   Collection Time: 05/31/24  1:33 PM  Result Value Ref Range   Glucose-Capillary 111 (H) 70 - 99 mg/dL    Comment: Glucose reference range applies only to samples taken after fasting for at least 8 hours.  Glucose, capillary     Status: Abnormal   Collection Time: 05/31/24  2:22 PM  Result Value Ref Range   Glucose-Capillary 115 (H) 70 - 99 mg/dL    Comment: Glucose reference range applies only to samples taken after fasting for at least 8 hours.   Comment 1 Document in Chart   PTH, intact and calcium     Status: None   Collection Time: 07/09/24  10:59 AM  Result Value Ref Range   Calcium 8.8 8.7 - 10.3 mg/dL   PTH 26 15 - 65 pg/mL   PTH Interp Comment     Comment: Interpretation                 Intact PTH    Calcium                                 (pg/mL)      (mg/dL) Normal                          15 - 65     8.6 - 10.2 Primary Hyperparathyroidism         >65          >10.2 Secondary Hyperparathyroidism       >65          <10.2 Non-Parathyroid  Hypercalcemia       <65          >10.2 Hypoparathyroidism                  <15          < 8.6 Non-Parathyroid  Hypocalcemia    15 - 65          < 8.6   TSH     Status: None   Collection Time: 07/09/24 10:59 AM  Result Value Ref Range   TSH 3.910 0.450 - 4.500 uIU/mL  T4, free     Status: None   Collection Time: 07/09/24 10:59 AM  Result Value Ref Range   Free T4 1.38 0.82 - 1.77 ng/dL   Nuclear medicine parathyroid  sestamibi scan on April 19, 2024 FINDINGS: Immediate phase images demonstrates expected uptake of radiotracer within the thyroid gland and salivary glands. 2 hour delayed SPECT images demonstrate an intense focus of radiotracer uptake posterior to the lower pole the right thyroid lobe compatible with  a parathyroid  adenoma. This corresponds to a mildly hyperdense nodule seen posterior to the trachea and along the right lateral aspect of the esophagus on CT examination of 11/29/2023 (image # 5, series # 2).   IMPRESSION: Parathyroid  adenoma posterior to the lower pole of the right thyroid lobe. See corresponding nodule on CT examination of 11/29/2023.   Right inferior parathyroidectomy on May 31, 2024 FINAL MICROSCOPIC DIAGNOSIS:   A. RIGHT PARATHYROID  ADENOMA, EXCISION:       Hypercellular parathyroid  tissue compatible with parathyroid   adenoma.      No evidence of atypia or malignancy.       See comment.   Assessment: 1.  Hyperparathyroidism on the background of improving CKD -status post right inferior parathyroidectomy 2.  Hypercalcemia-resolved.     Plan: - Patient is status post right inferior parathyroidectomy with resolution of hypercalcemia.  Surgical finding is consistent with parathyroid  adenoma -She has osteopenia, and remote past history of nephrolithiasis. -Denies history of fragility fractures. No abdominal pain, no major mood disorders, no bone pain. Her surgery was performed by Dr.  Krystal Spinner .  Her DEXA scan was reviewed showing osteopenia of hips.  She will need repeat bone density 2 years after the first 1 during her annual physical. She is advised to continue Zetia 10 mg p.o. daily for hyperlipidemia, Janumet 50/1000 mg p.o. twice daily for type 2 diabetes.  She is advised to maintain close follow-up with her PMD.  I spent  25  minutes in the care of the patient today including review of labs from Thyroid Function, CMP, and other relevant labs ; imaging/biopsy records (current and previous including abstractions from other facilities); face-to-face time discussing  her lab results and symptoms, medications doses, her options of short and long term treatment based on the latest standards of care / guidelines;   and documenting the encounter.  Joy Leach  participated in the discussions, expressed understanding, and voiced agreement with the above plans.  All questions were answered to her satisfaction. she is encouraged to contact clinic should she have any questions or concerns prior to her return visit.   - Return in about 1 year (around 07/14/2025) for F/U with Pre-visit Labs.   Ranny Earl, MD [facility] [address] [place], [place] [address] Phone: [phone]  Fax: [phone]    This note was partially dictated with voice recognition software. Similar sounding words can be transcribed inadequately or may not  be corrected upon review.  07/14/2024, 1:23 PM

## 2024-07-19 ENCOUNTER — Ambulatory Visit (INDEPENDENT_AMBULATORY_CARE_PROVIDER_SITE_OTHER): Admitting: Gastroenterology

## 2024-07-19 ENCOUNTER — Encounter (INDEPENDENT_AMBULATORY_CARE_PROVIDER_SITE_OTHER): Payer: Self-pay | Admitting: Gastroenterology

## 2024-07-19 VITALS — BP 113/73 | HR 71 | Temp 97.5°F | Ht 65.0 in | Wt 195.4 lb

## 2024-07-19 DIAGNOSIS — K589 Irritable bowel syndrome without diarrhea: Secondary | ICD-10-CM | POA: Insufficient documentation

## 2024-07-19 DIAGNOSIS — K580 Irritable bowel syndrome with diarrhea: Secondary | ICD-10-CM | POA: Diagnosis not present

## 2024-07-19 DIAGNOSIS — A084 Viral intestinal infection, unspecified: Secondary | ICD-10-CM

## 2024-07-19 MED ORDER — DICYCLOMINE HCL 10 MG PO CAPS: 10.0000 mg | ORAL_CAPSULE | Freq: Three times a day (TID) | ORAL | 1 refills | Status: AC | PRN

## 2024-07-19 NOTE — Patient Instructions (Addendum)
 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 Patient was counseled about the benefit of implementing a low FODMAP to improve symptoms and recurrent episodes. A dietary list was provided to the patient. Also, the patient was counseled about the benefit of avoiding stressing situations and potential environmental triggers leading to symptomatology. Continue dicyclomine  as needed for abdominal pain

## 2024-07-19 NOTE — Progress Notes (Signed)
 Toribio Fortune, M.D. Gastroenterology & Hepatology [facility] Gastroenterology [address] [place], [place] [address]  Primary Care Physician: Francis Alan CROME, FNP [address] [place] [address]  I will communicate my assessment and recommendations to the referring MD via EMR.  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  The patient was last seen on 05/03/2024. At that time, the patient was ordered to have C. difficile, GI pathogen and ova and parasite tested in stool which were negative.  She was advised to take dicyclomine  as needed for abdominal pain.  Patient reports that she has presented some episodes of diarrhea, possiby 1-2 times a week. States she has watery Bms x2  when this happens. Does not take Imodium anymore. She is still having bloating and passing gas frequently.  States that she has been taking Bentyl  as needed when she has pain in her abdomen, which is not all the time.  The patient denies having any nausea, vomiting, fever, chills, hematochezia, melena, hematemesis, abdominal pain, jaundice, pruritus or weight loss.  Last Colonoscopy:04/02/22 - Three 3 to 5 mm polyps in the ascending colon and in the cecum - One 2 mm polyp in the ascending colon - Two 4 to 5 mm polyps in the transverse colon  - Diverticulosis in the sigmoid colon. Biopsied. - Congested mucosa in the rectum. normal colon Biopsied. - Non-bleeding external and internal hemorrhoids. - Hemorrhoids found on perianal exam. (6 TAs, normal colonic biopsies)  Recommended repeat  colonoscopy in 3 years.   Last Endoscopy:10/26/21 normal esophagus  - Multiple gastric polyps. Fundic gland polyps - rest of the examined stomach was normal. Biopsied-normal - Normal duodenum. Biopsied-normal   ERCP 03/11/2023 by Dr. Ethel. CBD measuring 11 mm and there was no presence of filling defects in the bile duct, with normal intrahepatic ducts. The biliary tree was swept with a 12 mm balloon and some sludge was removed.   Past Medical History: Past Medical History:  Diagnosis Date   Anemia    Basal cell carcinoma (BCC) of skin of face 10/2023   left side   Chronic kidney disease    stage 3   Diabetes mellitus without complication (HCC)    Dyspnea    Fatty (change of) liver, not elsewhere classified    H/O blood clots    Headache    History of hypertension    Hyperlipidemia    Hypertension    Lupus    Mycobacterial disease    Osteoarthritis    Osteopenia    Peripheral vascular disease    Pneumonia    Sicca     Past Surgical History: Past Surgical History:  Procedure Laterality Date   ABDOMINAL HYSTERECTOMY     BIOPSY  10/26/2021   Procedure: BIOPSY;  Surgeon: Fortune Angelia Toribio, MD;  [facility];  Service: Gastroenterology;;   BIOPSY  04/02/2022   Procedure: BIOPSY;  Surgeon: Fortune Angelia Toribio, MD;  [facility];  Service: Gastroenterology;;   CHOLECYSTECTOMY N/A 03/07/2023   Procedure: LAPAROSCOPIC CHOLECYSTECTOMY;  Surgeon: Evonnie Dorothyann LABOR, DO;  Location: [facility];  Service: General;  Laterality: N/A;   COLONOSCOPY  05/26/2015   Dr  benson, mild diverticulosis in sigmoid colon, biosies taken from [facility] and sigmoid colon-negative   COLONOSCOPY WITH PROPOFOL  N/A 04/02/2022   Procedure: COLONOSCOPY WITH PROPOFOL ;  Surgeon: Eartha Angelia Sieving, MD;  [facility];  Service: Gastroenterology;  Laterality: N/A;  815   CYSTOSCOPY WITH RETROGRADE PYELOGRAM, URETEROSCOPY AND STENT PLACEMENT Right 01/30/2023    Procedure: CYSTOSCOPY WITH RETROGRADE PYELOGRAM, URETEROSCOPY AND STENT PLACEMENT;  Surgeon: Sherrilee Belvie CROME, MD;  [facility]: [facility];  Service: Urology;  Laterality: Right;   ESOPHAGOGASTRODUODENOSCOPY (EGD) WITH PROPOFOL  N/A 10/26/2021   Procedure: ESOPHAGOGASTRODUODENOSCOPY (EGD) WITH PROPOFOL ;  Surgeon: Eartha Angelia Sieving, MD;  [facility];  Service: Gastroenterology;  Laterality: N/A;  1045   EXTRACORPOREAL SHOCK WAVE LITHOTRIPSY Right 12/03/2022   Procedure: EXTRACORPOREAL SHOCK WAVE LITHOTRIPSY (ESWL);  Surgeon: Sherrilee Belvie CROME, MD;  [facility]: [facility];  Service: Urology;  Laterality: Right;   EYE SURGERY     HEMOSTASIS CLIP PLACEMENT  10/26/2021   Procedure: HEMOSTASIS CLIP PLACEMENT;  Surgeon: Eartha Angelia Sieving, MD;  [facility];  Service: Gastroenterology;;   HOLMIUM LASER APPLICATION Right 01/30/2023   Procedure: HOLMIUM LASER APPLICATION;  Surgeon: Sherrilee Belvie CROME, MD;  [facility];  Service: Urology;  Laterality: Right;   HOT HEMOSTASIS  10/26/2021   Procedure: HOT HEMOSTASIS (ARGON PLASMA COAGULATION/BICAP);  Surgeon: Eartha Angelia, Sieving, MD;  [facility];  Service: Gastroenterology;;   hystorectomy     PARATHYROIDECTOMY Right 05/31/2024   Procedure: PARATHYROIDECTOMY;  Surgeon: Eletha Boas, MD;  Location: [facility];  Service: General;  Laterality: Right;  RIGHT INFERIOR PARATHYROIDECTOMY   POLYPECTOMY  10/26/2021   Procedure: POLYPECTOMY;  Surgeon: Eartha Angelia Sieving, MD;  [facility];  Service: Gastroenterology;;   POLYPECTOMY  04/02/2022   Procedure: POLYPECTOMY;  Surgeon: Eartha Angelia, Sieving, MD;  [facility];  Service: Gastroenterology;;   SINUS SURGERY WITH INSTATRAK  2015   SKIN BIOPSY      Family History: Family History  Problem Relation Age of Onset   Hypertension Mother    Diabetes Mother    Kidney disease Mother    Heart disease Mother    High Cholesterol  Mother    Lung cancer Mother    Hypertension Father    Diabetes Father    Kidney disease Father    Heart disease Father     Social History: Social History   Tobacco Use  Smoking Status Never   Passive exposure: Never  Smokeless Tobacco Never   Social History   Substance and Sexual Activity  Alcohol  Use No   Social History   Substance and Sexual Activity  Drug Use No    Allergies: Allergies  Allergen Reactions   Sulfa Antibiotics Hives   Caffeine Other (See Comments)    Bladder infections   Codeine Nausea And Vomiting    Medications: Current Outpatient Medications  Medication Sig Dispense Refill   acetaminophen  (TYLENOL ) 500 MG tablet Take 2 tablets (1,000 mg total) by mouth every 8 (eight) hours as needed for mild pain, fever or headache.     apixaban  (ELIQUIS ) 5 MG TABS tablet Take 1 tablet (5 mg total) by mouth 2 (two) times daily. To be resume at the moment of discharge and after all procedures completed.     azelastine (ASTELIN) 0.1 % nasal spray Place 1 spray into both nostrils daily.     Cinnamon 500 MG capsule Take 500 mg by mouth daily.     dicyclomine  (BENTYL ) 10 MG capsule  Take 1 capsule (10 mg total) by mouth every 8 (eight) hours as needed (abdominal pain or diarrhea). 90 capsule 0   ezetimibe (ZETIA) 10 MG tablet Take 10 mg by mouth daily.     fenofibrate (TRICOR) 145 MG tablet Take 145 mg by mouth every evening.     ferrous sulfate  325 (65 FE) MG EC tablet Take 1 tablet (325 mg total) by mouth daily with breakfast. Resume at time of discharge     furosemide  (LASIX ) 20 MG tablet TAKE (1) TABLET BY MOUTH ONCE DAILY AS NEEDED. (Patient taking differently: Take 20 mg by mouth as needed.) 30 tablet 0   hydrocortisone  (ANUSOL -HC) 2.5 % rectal cream Place 1 Application rectally 4 (four) times daily. Use four times per day x10 days then PRN thereafter (Patient taking differently: Place 1 Application rectally daily as needed for hemorrhoids or anal itching.) 56 g  1   levocetirizine (XYZAL) 5 MG tablet Take 5 mg by mouth every evening.     metFORMIN (GLUCOPHAGE) 1000 MG tablet Take 1,000 mg by mouth 2 (two) times daily.     montelukast (SINGULAIR) 10 MG tablet Take 10 mg by mouth daily.     Multiple Vitamin (MULTIVITAMIN) tablet Take 1 tablet by mouth in the morning.     ondansetron  (ZOFRAN ) 4 MG tablet Take 1 tablet (4 mg total) by mouth every 8 (eight) hours as needed for nausea or vomiting. 4 tablet 0   pantoprazole  (PROTONIX ) 40 MG tablet TAKE ONE TABLET BY MOUTH EVERY DAY 90 tablet 2   pioglitazone (ACTOS) 15 MG tablet Take 15 mg by mouth daily.     polyvinyl alcohol  (LIQUIFILM TEARS) 1.4 % ophthalmic solution Place 1 drop into both eyes as needed for dry eyes.     predniSONE  5 MG TBEC Take 5 mg by mouth daily with breakfast.     Turmeric (QC TUMERIC COMPLEX PO) Take 1 capsule by mouth daily.     loperamide (IMODIUM A-D) 2 MG tablet Take 2 mg by mouth 2 (two) times daily as needed for diarrhea or loose stools. (Patient not taking: Reported on 07/19/2024)     No current facility-administered medications for this visit.    Review of Systems: GENERAL: negative for malaise, night sweats HEENT: No changes in hearing or vision, no nose bleeds or other nasal problems. NECK: Negative for lumps, goiter, pain and significant neck swelling RESPIRATORY: Negative for cough, wheezing CARDIOVASCULAR: Negative for chest pain, leg swelling, palpitations, orthopnea GI: SEE HPI MUSCULOSKELETAL: Negative for joint pain or swelling, back pain, and muscle pain. SKIN: Negative for lesions, rash PSYCH: Negative for sleep disturbance, mood disorder and recent psychosocial stressors. HEMATOLOGY Negative for prolonged bleeding, bruising easily, and swollen nodes. ENDOCRINE: Negative for cold or heat intolerance, polyuria, polydipsia and goiter. NEURO: negative for tremor, gait imbalance, syncope and seizures. The remainder of the review of systems is  noncontributory.   Physical Exam: BP 113/73 (BP Location: Left Arm, Patient Position: Sitting, Cuff Size: Large)   Pulse 71   Temp (!) 97.5 F (36.4 C) (Temporal)   Ht 5' 5 (1.651 m)   Wt 195 lb 6.4 oz (88.6 kg)   BMI 32.52 kg/m  GENERAL: The patient is AO x3, in no acute distress. HEENT: Head is normocephalic and atraumatic. EOMI are intact. Mouth is well hydrated and without lesions. NECK: Supple. No masses LUNGS: Clear to auscultation. No presence of rhonchi/wheezing/rales. Adequate chest expansion HEART: RRR, normal s1 and s2. ABDOMEN: Soft, nontender, no guarding, no peritoneal  signs, and nondistended. BS +. No masses. EXTREMITIES: Without any cyanosis, clubbing, rash, lesions or edema. NEUROLOGIC: AOx3, no focal motor deficit. SKIN: no jaundice, no rashes  Imaging/Labs: as above  I personally reviewed and interpreted the available labs, imaging and endoscopic files.  Impression and Plan: Tyjah Hai is a 68 y.o. 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  Patient has presented significant improvement of her symptoms compared to her last office visit.  This is reassuring and patient is feeling much better compared to prior.  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  She is still presenting some bloating and diarrhea.  I encouraged the patient to implement dietary changes, but she can also continue dicyclomine  for relief of abdominal pain.  -Explained presumed etiology of IBS symptoms. Patient was counseled about the benefit of implementing a low FODMAP to improve symptoms and recurrent episodes. A dietary list was provided to the patient. Also, the patient was counseled about the benefit of avoiding stressing situations and potential environmental triggers leading to symptomatology. -Continue  dicyclomine  as needed for abdominal pain  All questions were answered.      Toribio Fortune, MD Gastroenterology and Hepatology [facility] Gastroenterology

## 2024-07-22 ENCOUNTER — Other Ambulatory Visit (HOSPITAL_COMMUNITY): Payer: Self-pay | Admitting: Family Medicine

## 2024-07-22 DIAGNOSIS — Z1231 Encounter for screening mammogram for malignant neoplasm of breast: Secondary | ICD-10-CM

## 2024-07-26 ENCOUNTER — Inpatient Hospital Stay
Admission: RE | Admit: 2024-07-26 | Discharge: 2024-07-26 | Disposition: A | Discharge status: Home / Self Care | Payer: Self-pay | Source: Ambulatory Visit | Attending: Family Medicine | Admitting: Family Medicine

## 2024-07-26 ENCOUNTER — Other Ambulatory Visit (HOSPITAL_COMMUNITY): Payer: Self-pay | Admitting: Family Medicine

## 2024-07-26 DIAGNOSIS — Z1231 Encounter for screening mammogram for malignant neoplasm of breast: Secondary | ICD-10-CM

## 2024-07-28 ENCOUNTER — Encounter (INDEPENDENT_AMBULATORY_CARE_PROVIDER_SITE_OTHER): Payer: Self-pay | Admitting: Gastroenterology

## 2024-08-04 ENCOUNTER — Ambulatory Visit (HOSPITAL_COMMUNITY)

## 2024-08-23 ENCOUNTER — Ambulatory Visit (HOSPITAL_COMMUNITY)
Admission: RE | Admit: 2024-08-23 | Discharge: 2024-08-23 | Disposition: A | Discharge status: Home / Self Care | Source: Ambulatory Visit | Attending: Family Medicine | Admitting: Family Medicine

## 2024-08-23 DIAGNOSIS — Z1231 Encounter for screening mammogram for malignant neoplasm of breast: Secondary | ICD-10-CM | POA: Insufficient documentation

## 2024-08-29 ENCOUNTER — Encounter (INDEPENDENT_AMBULATORY_CARE_PROVIDER_SITE_OTHER): Payer: Self-pay | Admitting: Gastroenterology

## 2024-08-30 NOTE — Telephone Encounter (Signed)
 Last seen 07/19/2024 by Dr. Eartha, IBS diarrhea.

## 2024-09-23 ENCOUNTER — Ambulatory Visit

## 2024-09-23 VITALS — BP 131/72 | HR 76 | Temp 98.3°F | Resp 13 | Ht 64.0 in | Wt 194.8 lb

## 2024-09-23 DIAGNOSIS — R5383 Other fatigue: Secondary | ICD-10-CM

## 2024-09-23 DIAGNOSIS — Z86711 Personal history of pulmonary embolism: Secondary | ICD-10-CM

## 2024-09-23 DIAGNOSIS — N766 Ulceration of vulva: Secondary | ICD-10-CM | POA: Diagnosis not present

## 2024-09-23 DIAGNOSIS — N96 Recurrent pregnancy loss: Secondary | ICD-10-CM | POA: Diagnosis not present

## 2024-09-23 DIAGNOSIS — K1379 Other lesions of oral mucosa: Secondary | ICD-10-CM | POA: Diagnosis not present

## 2024-09-23 DIAGNOSIS — R7689 Other specified abnormal immunological findings in serum: Secondary | ICD-10-CM

## 2024-09-23 DIAGNOSIS — Z86718 Personal history of other venous thrombosis and embolism: Secondary | ICD-10-CM | POA: Diagnosis not present

## 2024-09-23 DIAGNOSIS — Z7952 Long term (current) use of systemic steroids: Secondary | ICD-10-CM

## 2024-09-23 DIAGNOSIS — M6281 Muscle weakness (generalized): Secondary | ICD-10-CM | POA: Diagnosis not present

## 2024-09-23 MED ORDER — PREDNISONE 1 MG PO TABS: 9.0000 mg | ORAL_TABLET | Freq: Every day | ORAL | 2 refills | Status: AC

## 2024-09-23 NOTE — Progress Notes (Unsigned)
 Office Visit Note  Patient: Joy Leach             Date of Birth: 12/23/1955           MRN: [id number]             PCP: Francis Alan CROME, FNP Referring: Francis Alan CROME, FNP Visit Date: 09/23/2024 Occupation: Data Unavailable  Subjective:  No chief complaint on file.   History of Present Illness: Joy Leach is a 68 y.o. female ***     Activities of Daily Living:  Patient reports morning stiffness for 0 minutes.   Patient Denies nocturnal pain.  Difficulty dressing/grooming: Denies Difficulty climbing stairs: Reports Difficulty getting out of chair: Reports Difficulty using hands for taps, buttons, cutlery, and/or writing: Denies  Review of Systems  Constitutional:  Positive for fatigue.  HENT:  Positive for mouth sores and mouth dryness.   Eyes:  Positive for dryness.  Respiratory:  Positive for shortness of breath.   Cardiovascular:  Negative for chest pain and palpitations.  Gastrointestinal:  Negative for blood in stool and constipation.  Endocrine: Negative for increased urination.  Genitourinary:  Negative for involuntary urination.  Musculoskeletal:  Positive for joint pain, gait problem, joint pain, muscle weakness and muscle tenderness. Negative for joint swelling, myalgias, morning stiffness and myalgias.  Skin:  Positive for color change and sensitivity to sunlight. Negative for rash and hair loss.  Allergic/Immunologic: Positive for susceptible to infections.  Neurological:  Positive for dizziness and headaches.  Hematological:  Negative for swollen glands.  Psychiatric/Behavioral:  Positive for sleep disturbance. Negative for depressed mood. The patient is nervous/anxious.     PMFS History:  Patient Active Problem List   Diagnosis Date Noted   IBS (irritable bowel syndrome) 07/19/2024   Diarrhea 05/03/2024   Systemic lupus erythematosus (SLE) in adult [facility] 07/02/2023   Hemorrhoids 06/30/2023   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   Class 1 obesity  03/06/2023   Controlled type 2 diabetes mellitus without complication, without long-term current use of insulin  (HCC) 03/06/2023   Hyperparathyroidism, primary 02/21/2023   Hypercalcemia 02/21/2023   Anemia in other chronic diseases classified elsewhere 07/02/2022   History of DVT (deep vein thrombosis) 07/02/2022   Stage 3b chronic kidney disease (HCC) 07/02/2022   Gastroesophageal reflux disease 10/21/2021   History of pulmonary embolism 10/14/2020   Type 2 diabetes mellitus with diabetic chronic kidney disease (HCC) 06/07/2020   Hypertension 05/24/2020   Hyperlipidemia 06/12/2016    Past Medical History:  Diagnosis Date   Anemia    Basal cell carcinoma (BCC) of skin of face 10/2023   left side   Chronic kidney disease    stage 3   Diabetes mellitus without complication (HCC)    Dyspnea    Fatty (change of) liver, not elsewhere classified    H/O blood clots    Headache    History of hypertension    Hyperlipidemia    Hypertension    Lupus    Mycobacterial disease    Osteoarthritis    Osteopenia    Peripheral vascular disease    Pneumonia    Sicca     Family History  Problem Relation Age of Onset   Hypertension Mother    Diabetes Mother    Kidney disease Mother    Heart disease Mother    High Cholesterol Mother    Lung cancer Mother    Hypertension Father    Diabetes Father    Kidney disease Father    Heart disease  Father    Past Surgical History:  Procedure Laterality Date   ABDOMINAL HYSTERECTOMY     BIOPSY  10/26/2021   Procedure: BIOPSY;  Surgeon: Eartha Angelia Sieving, MD;  [facility];  Service: Gastroenterology;;   BIOPSY  04/02/2022   Procedure: BIOPSY;  Surgeon: Eartha Angelia Sieving, MD;  [facility];  Service: Gastroenterology;;   CHOLECYSTECTOMY N/A 03/07/2023   Procedure: LAPAROSCOPIC CHOLECYSTECTOMY;  Surgeon: Evonnie Dorothyann LABOR, DO;  [facility]: [facility];  Service: General;  Laterality: N/A;   COLONOSCOPY   05/26/2015   Dr donnel, mild diverticulosis in sigmoid colon, biosies taken from [facility] and sigmoid colon-negative   COLONOSCOPY WITH PROPOFOL  N/A 04/02/2022   Procedure: COLONOSCOPY WITH PROPOFOL ;  Surgeon: Eartha Angelia Sieving, MD;  [facility];  Service: Gastroenterology;  Laterality: N/A;  815   CYSTOSCOPY WITH RETROGRADE PYELOGRAM, URETEROSCOPY AND STENT PLACEMENT Right 01/30/2023   Procedure: CYSTOSCOPY WITH RETROGRADE PYELOGRAM, URETEROSCOPY AND STENT PLACEMENT;  Surgeon: Sherrilee Belvie CROME, MD;  [facility];  Service: Urology;  Laterality: Right;   ESOPHAGOGASTRODUODENOSCOPY (EGD) WITH PROPOFOL  N/A 10/26/2021   Procedure: ESOPHAGOGASTRODUODENOSCOPY (EGD) WITH PROPOFOL ;  Surgeon: Eartha Angelia Sieving, MD;  [facility];  Service: Gastroenterology;  Laterality: N/A;  1045   EXTRACORPOREAL SHOCK WAVE LITHOTRIPSY Right 12/03/2022   Procedure: EXTRACORPOREAL SHOCK WAVE LITHOTRIPSY (ESWL);  Surgeon: Sherrilee Belvie CROME, MD;  [facility];  Service: Urology;  Laterality: Right;   EYE SURGERY     HEMOSTASIS CLIP PLACEMENT  10/26/2021   Procedure: HEMOSTASIS CLIP PLACEMENT;  Surgeon: Eartha Angelia Sieving, MD;  [facility];  Service: Gastroenterology;;   HOLMIUM LASER APPLICATION Right 01/30/2023   Procedure: HOLMIUM LASER APPLICATION;  Surgeon: Sherrilee Belvie CROME, MD;  [facility];  Service: Urology;  Laterality: Right;   HOT HEMOSTASIS  10/26/2021   Procedure: HOT HEMOSTASIS (ARGON PLASMA COAGULATION/BICAP);  Surgeon: Eartha Angelia, Sieving, MD;  [facility];  Service: Gastroenterology;;   hystorectomy     PARATHYROIDECTOMY Right 05/31/2024   Procedure: PARATHYROIDECTOMY;  Surgeon: Eletha Boas, MD;  Location[facility];  Service: General;  Laterality: Right;  RIGHT INFERIOR PARATHYROIDECTOMY   POLYPECTOMY  10/26/2021   Procedure: POLYPECTOMY;  Surgeon: Eartha Angelia Sieving, MD;  [facility];  Service:  Gastroenterology;;   POLYPECTOMY  04/02/2022   Procedure: POLYPECTOMY;  Surgeon: Eartha Angelia Sieving, MD;  [facility];  Service: Gastroenterology;;   SINUS SURGERY WITH INSTATRAK  2015   SKIN BIOPSY     Social History[1] Social History   Social History Narrative   Lives home alone.  Widow.    Is Disabled.  One child.  Sister, Orie.      There is no immunization history on file for this patient.   Objective: Vital Signs: There were no vitals taken for this visit.   Physical Exam   Musculoskeletal Exam: ***  CDAI Exam: CDAI Score: -- Patient Global: --; Provider Global: -- Swollen: --; Tender: -- Joint Exam 09/23/2024   No joint exam has been documented for this visit   There is currently no information documented on the homunculus. Go to the Rheumatology activity and complete the homunculus joint exam.  Investigation: No additional findings.  Imaging: No results found.  Recent Labs: Lab Results  Component Value Date   WBC 7.7 05/26/2024   HGB 12.4 05/26/2024   PLT 252 05/26/2024   NA 136 05/26/2024   K 3.9 05/26/2024   CL 103 05/26/2024  CO2 25 05/26/2024   GLUCOSE 156 (H) 05/26/2024   BUN 28 (H) 05/26/2024   CREATININE 1.35 (H) 05/26/2024   BILITOT 0.4 03/30/2024   ALKPHOS 70 03/30/2024   AST 18 03/30/2024   ALT 17 03/30/2024   PROT 5.9 (L) 03/30/2024   ALBUMIN 4.2 03/30/2024   CALCIUM 8.8 07/09/2024   GFRAA 73 03/11/2018    Speciality Comments: No specialty comments available.  Procedures:  No procedures performed Allergies: Sulfa antibiotics, Caffeine, and Codeine   Assessment / Plan:     Visit Diagnoses: No diagnosis found.  Orders: No orders of the defined types were placed in this encounter.  No orders of the defined types were placed in this encounter.   Face-to-face time spent with patient was *** minutes. Greater than 50% of time was spent in counseling and coordination of care.  Follow-Up Instructions: No  follow-ups on file.   Alfonso Patterson, LPN  Note - This record has been created using [employer].  Chart creation errors have been sought, but may not always  have been located. Such creation errors do not reflect on  the standard of medical care.    [1]  Social History Tobacco Use   Smoking status: Never    Passive exposure: Never   Smokeless tobacco: Never  Vaping Use   Vaping status: Never Used  Substance Use Topics   Alcohol  use: No   Drug use: No

## 2024-09-28 LAB — SJOGREN'S SYNDROME ANTIBODS(SSA + SSB)
SSA (Ro) (ENA) Antibody, IgG: <1 AI
SSB (La) (ENA) Antibody, IgG: <1 AI

## 2024-09-28 LAB — PHOSPHATIDYLSERINE/PROTHROMBIN (PS/PT) ANTIBODIES (IGG, IGM)
Phosphatidylserine/Prothrombin Ab (IgG): <9 U (ref <=30)
Phosphatidylserine/Prothrombin Ab (IgM): <9 U (ref <=30)

## 2024-09-28 LAB — CARDIOLIPIN ANTIBODIES, IGG, IGM, IGA
Anticardiolipin IgA: <2 [APL'U]/mL (ref <20.0)
Anticardiolipin IgG: <2 [GPL'U]/mL (ref <20.0)
Anticardiolipin IgM: <2 [MPL'U]/mL (ref <20.0)

## 2024-09-28 LAB — LUPUS ANTICOAGULANT EVAL W/ REFLEX
PTT-LA Screen: 32 s (ref <=40)
dRVVT: 48 s — ABNORMAL HIGH (ref <=45)

## 2024-09-28 LAB — IGG, IGA, IGM
IgG (Immunoglobin G), Serum: 480 mg/dL — ABNORMAL LOW (ref 600–1540)
IgM, Serum: 106 mg/dL (ref 50–300)
Immunoglobulin A: 92 mg/dL (ref 70–320)

## 2024-09-28 LAB — SEDIMENTATION RATE: Sed Rate: 14 mm/h (ref 0–30)

## 2024-09-28 LAB — C-REACTIVE PROTEIN: CRP: <3 mg/L (ref <8.0)

## 2024-09-28 LAB — BETA-2 GLYCOPROTEIN ANTIBODIES
Beta-2 Glyco 1 IgA: <2 U/mL (ref <20.0)
Beta-2 Glyco 1 IgM: <2 U/mL (ref <20.0)
Beta-2 Glyco I IgG: <2 U/mL (ref <20.0)

## 2024-09-28 LAB — C3 AND C4
C3 Complement: 150 mg/dL (ref 83–193)
C4 Complement: 22 mg/dL (ref 15–57)

## 2024-09-28 LAB — ANA: Anti Nuclear Antibody (ANA): NEGATIVE

## 2024-09-28 LAB — VITAMIN B12: Vitamin B-12: 510 pg/mL (ref 200–1100)

## 2024-09-28 LAB — ANTI-SMITH ANTIBODY: ENA SM Ab Ser-aCnc: <1 AI

## 2024-09-28 LAB — CK: Total CK: 55 U/L (ref 20–243)

## 2024-09-28 LAB — ANGIOTENSIN CONVERTING ENZYME: Angiotensin-Converting Enzyme: 24 U/L (ref 9–67)

## 2024-09-28 LAB — RNP ANTIBODY: Ribonucleic Protein(ENA) Antibody, IgG: <1 AI

## 2024-09-28 LAB — ANCA SCREEN W REFLEX TITER: ANCA SCREEN: NEGATIVE

## 2024-09-28 LAB — ANTI-DNA ANTIBODY, DOUBLE-STRANDED: ds DNA Ab: <1 [IU]/mL

## 2024-09-28 LAB — RFLX DRVVT CONFRIM: DRVVT CONFIRM: NEGATIVE

## 2024-10-01 ENCOUNTER — Ambulatory Visit: Admitting: Urology

## 2024-10-12 ENCOUNTER — Ambulatory Visit: Payer: Self-pay

## 2024-10-20 NOTE — Progress Notes (Unsigned)
 "  Office Visit Note  Patient: Joy Leach             Date of Birth: 1955/10/25           MRN: [id number]             PCP: Joy Alan CROME, FNP Referring: Joy Alan CROME, FNP Visit Date: 10/25/2024 Occupation: Data Unavailable  Subjective:  No chief complaint on file.   Discussed the use of AI scribe software for clinical note transcription with the patient, who gave verbal consent to proceed.  History of Present Illness Joy Leach is a 69 year old female with prior dx of lupus who presents for new patient follow-up.  Discussed lab results from last encounter with patient that were demonstrated negative testing for lupus, sjogren's, ANCA, sarcoidosis, and MCTD. Inflammatory markers and CK were also negative. Did discuss decreased IgG level.   She continues to experiences significant fatigue, describing it as 'terrible' and lacking energy for daily activities.  She has been on prednisone  for approximately eight years, with doses varying over time. Currently tapering from 10mg  to 9mg , she reports no significant changes in symptoms with the reduction. She is concerned about the long-term effects of prednisone  and is eager to discontinue it safely.  Her PCP started her on Lyrica  after he last visit for treatment of her fibromyalgia, and she does not note any change in her symptoms yet.       Activities of Daily Living:  Patient reports morning stiffness for 5-10 minutes.   Patient Reports nocturnal pain.  Difficulty dressing/grooming: Denies Difficulty climbing stairs: Reports Difficulty getting out of chair: Denies Difficulty using hands for taps, buttons, cutlery, and/or writing: Denies  Review of Systems  Constitutional:  Positive for fatigue.  HENT:  Positive for mouth sores and mouth dryness.   Eyes:  Positive for dryness.  Respiratory:  Positive for shortness of breath.   Cardiovascular:  Negative for chest pain and palpitations.  Gastrointestinal:  Positive for  constipation and diarrhea. Negative for blood in stool.  Endocrine: Negative for increased urination.  Genitourinary:  Positive for involuntary urination.  Musculoskeletal:  Positive for joint pain, gait problem, joint pain, myalgias, muscle weakness, morning stiffness and myalgias. Negative for joint swelling and muscle tenderness.  Skin:  Positive for rash and sensitivity to sunlight. Negative for color change and hair loss.  Allergic/Immunologic: Positive for susceptible to infections.  Neurological:  Positive for dizziness and headaches.  Hematological:  Negative for swollen glands.  Psychiatric/Behavioral:  Positive for sleep disturbance. Negative for depressed mood. The patient is nervous/anxious.     PMFS History:  Patient Active Problem List   Diagnosis Date Noted   IBS (irritable bowel syndrome) 07/19/2024   Diarrhea 05/03/2024   Systemic lupus erythematosus (SLE) in adult [facility]) 07/02/2023   Hemorrhoids 06/30/2023   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   Class 1 obesity 03/06/2023   Controlled type 2 diabetes mellitus without complication, without long-term current use of insulin  (HCC) 03/06/2023   Hyperparathyroidism, primary 02/21/2023   Hypercalcemia 02/21/2023   Anemia in other chronic diseases classified elsewhere 07/02/2022   History of DVT (deep vein thrombosis) 07/02/2022   Stage 3b chronic kidney disease (HCC) 07/02/2022   Gastroesophageal reflux disease 10/21/2021   History of pulmonary embolism 10/14/2020   Type 2 diabetes mellitus with diabetic chronic kidney disease (HCC) 06/07/2020   Hypertension 05/24/2020   Hyperlipidemia 06/12/2016    Past Medical History:  Diagnosis Date   Anemia    Basal cell  carcinoma (BCC) of skin of face 10/2023   left side   Chronic kidney disease    stage 3   Diabetes mellitus without complication (HCC)    Dyspnea    Fatty (change of) liver, not elsewhere classified    H/O blood clots    Headache     History of hypertension    Hyperlipidemia    Hypertension    Lupus    Mycobacterial disease    Osteoarthritis    Osteopenia    Peripheral vascular disease    Pneumonia    Raynaud disease    Sicca     Family History  Problem Relation Age of Onset   Hypertension Mother    Diabetes Mother    Kidney disease Mother    Heart disease Mother    High Cholesterol Mother    Lung cancer Mother    Kidney Stones Father    Osteoarthritis Sister    Hypertension Sister    Hyperlipidemia Sister    Diabetes Sister    Fibromyalgia Sister    Hypertension Sister    Kidney disease Sister    Kidney disease Sister    Hypertension Sister    Lupus Sister    Cirrhosis Sister    Diabetes Maternal Grandmother    Diabetes Paternal Grandmother    Cirrhosis Paternal Grandmother    Diabetes Paternal Grandfather    Diabetes Son    Past Surgical History:  Procedure Laterality Date   ABDOMINAL HYSTERECTOMY     BIOPSY  10/26/2021   Procedure: BIOPSY;  Surgeon: Eartha Angelia Sieving, MD;  [facility];  Service: Gastroenterology;;   BIOPSY  04/02/2022   Procedure: BIOPSY;  Surgeon: Eartha Angelia Sieving, MD;  [facility];  Service: Gastroenterology;;   CHOLECYSTECTOMY N/A 03/07/2023   Procedure: LAPAROSCOPIC CHOLECYSTECTOMY;  Surgeon: Evonnie Dorothyann LABOR, DO;  [facility];  Service: General;  Laterality: N/A;   COLONOSCOPY  05/26/2015   Dr donnel, mild diverticulosis in sigmoid colon, biosies taken from [facility] and sigmoid colon-negative   COLONOSCOPY WITH PROPOFOL  N/A 04/02/2022   Procedure: COLONOSCOPY WITH PROPOFOL ;  Surgeon: Eartha Angelia Sieving, MD;  [facility];  Service: Gastroenterology;  Laterality: N/A;  815   CYSTOSCOPY WITH RETROGRADE PYELOGRAM, URETEROSCOPY AND STENT PLACEMENT Right 01/30/2023   Procedure: CYSTOSCOPY WITH RETROGRADE PYELOGRAM, URETEROSCOPY AND STENT PLACEMENT;  Surgeon: Sherrilee Belvie CROME, MD;  [facility]: [facility];  Service: Urology;  Laterality: Right;   ESOPHAGOGASTRODUODENOSCOPY (EGD) WITH PROPOFOL  N/A 10/26/2021   Procedure: ESOPHAGOGASTRODUODENOSCOPY (EGD) WITH PROPOFOL ;  Surgeon: Eartha Angelia Sieving, MD;  [facility];  Service: Gastroenterology;  Laterality: N/A;  1045   EXTRACORPOREAL SHOCK WAVE LITHOTRIPSY Right 12/03/2022   Procedure: EXTRACORPOREAL SHOCK WAVE LITHOTRIPSY (ESWL);  Surgeon: Sherrilee Belvie CROME, MD;  [facility];  Service: Urology;  Laterality: Right;   EYE SURGERY     HEMOSTASIS CLIP PLACEMENT  10/26/2021   Procedure: HEMOSTASIS CLIP PLACEMENT;  Surgeon: Eartha Angelia Sieving, MD;  [facility];  Service: Gastroenterology;;   HOLMIUM LASER APPLICATION Right 01/30/2023   Procedure: HOLMIUM LASER APPLICATION;  Surgeon: Sherrilee Belvie CROME, MD;  [facility];  Service: Urology;  Laterality: Right;   HOT HEMOSTASIS  10/26/2021   Procedure: HOT HEMOSTASIS (ARGON PLASMA COAGULATION/BICAP);  Surgeon: Eartha Angelia, Sieving, MD;  [facility];  Service: Gastroenterology;;   hystorectomy     PARATHYROIDECTOMY Right 05/31/2024   Procedure: PARATHYROIDECTOMY;  Surgeon: Eletha Boas, MD;  [facility];  Service: General;  Laterality: Right;  RIGHT INFERIOR PARATHYROIDECTOMY   POLYPECTOMY  10/26/2021   Procedure: POLYPECTOMY;  Surgeon: Eartha Angelia Sieving, MD;  [facility];  Service: Gastroenterology;;   POLYPECTOMY  04/02/2022   Procedure: POLYPECTOMY;  Surgeon: Eartha Angelia Sieving, MD;  [facility];  Service: Gastroenterology;;   SINUS SURGERY WITH INSTATRAK  2015   SKIN BIOPSY     Social History[1] Social History   Social History Narrative   Lives home alone.  Widow.    Is Disabled.  One child.  Sister, Orie.      There is no immunization history on file for this patient.   Objective: Vital Signs: BP 110/69   Pulse 79   Temp 97.9 F  (36.6 C)   Resp 16   Ht 5' 4 (1.626 m)   Wt 193 lb 9.6 oz (87.8 kg)   BMI 33.23 kg/m    Physical Exam   Musculoskeletal Exam: ***  CDAI Exam: CDAI Score: -- Patient Global: --; Provider Global: -- Swollen: --; Tender: -- Joint Exam 10/25/2024   No joint exam has been documented for this visit   There is currently no information documented on the homunculus. Go to the Rheumatology activity and complete the homunculus joint exam.  Investigation: No additional findings.  Imaging: No results found.  Recent Labs: Lab Results  Component Value Date   WBC 7.7 05/26/2024   HGB 12.4 05/26/2024   PLT 252 05/26/2024   NA 136 05/26/2024   K 3.9 05/26/2024   CL 103 05/26/2024   CO2 25 05/26/2024   GLUCOSE 156 (H) 05/26/2024   BUN 28 (H) 05/26/2024   CREATININE 1.35 (H) 05/26/2024   BILITOT 0.4 03/30/2024   ALKPHOS 70 03/30/2024   AST 18 03/30/2024   ALT 17 03/30/2024   PROT 5.9 (L) 03/30/2024   ALBUMIN 4.2 03/30/2024   CALCIUM 8.8 07/09/2024   GFRAA 73 03/11/2018    Speciality Comments: No specialty comments available.  Procedures:  No procedures performed Allergies: Sulfa antibiotics, Caffeine, and Codeine   Assessment / Plan:     Visit Diagnoses: Positive ANA (antinuclear antibody) - prior dx of lupus based on butterfly rash, oral ulcers, and fatigue. Unclear if this is correct diagnosis at this time.  Long term systemic steroid user  Genital ulcer, female  History of DVT (deep vein thrombosis)  Hx of pulmonary embolus  History of recurrent miscarriages  Other fatigue  Low immunoglobulin level - Plan: IgG, IgA, IgM  Orders: Orders Placed This Encounter  Procedures   IgG, IgA, IgM   No orders of the defined types were placed in this encounter.  I personally spent a total of 40 minutes in the care of the patient today including preparing to see the patient, getting/reviewing separately obtained history, performing a medically appropriate  exam/evaluation, counseling and educating, placing orders, documenting clinical information in the EHR, independently interpreting results, and communicating results.   Follow-Up Instructions: Return in about 6 months (around 04/24/2025).   Asberry Claw, DO  Note - This record has been created using Animal nutritionist.  Chart creation errors have been sought, but may not always  have been located. Such creation errors do not reflect on  the standard of medical care.       [1] Social History Tobacco Use   Smoking status: Never    Passive exposure: Past   Smokeless tobacco: Never  Vaping Use   Vaping status: Never Used  Substance Use Topics  Alcohol  use: No   Drug use: No  "

## 2024-10-25 ENCOUNTER — Ambulatory Visit

## 2024-10-25 VITALS — BP 110/69 | HR 79 | Temp 97.9°F | Resp 16 | Ht 64.0 in | Wt 193.6 lb

## 2024-10-25 DIAGNOSIS — Z86718 Personal history of other venous thrombosis and embolism: Secondary | ICD-10-CM

## 2024-10-25 DIAGNOSIS — N96 Recurrent pregnancy loss: Secondary | ICD-10-CM

## 2024-10-25 DIAGNOSIS — R7689 Other specified abnormal immunological findings in serum: Secondary | ICD-10-CM

## 2024-10-25 DIAGNOSIS — R5383 Other fatigue: Secondary | ICD-10-CM

## 2024-10-25 DIAGNOSIS — Z86711 Personal history of pulmonary embolism: Secondary | ICD-10-CM

## 2024-10-25 DIAGNOSIS — Z7952 Long term (current) use of systemic steroids: Secondary | ICD-10-CM | POA: Diagnosis not present

## 2024-10-25 DIAGNOSIS — N766 Ulceration of vulva: Secondary | ICD-10-CM

## 2024-10-25 NOTE — Patient Instructions (Addendum)
 Please start Prednisone  8mg  daily on 10/26/24 and continue to 11/23/24 Please decrease Prednisone  to 7mg  daily on 11/24/24 and continue to 12/22/24 Please decrease Prednisone  to 6mg  daily on 12/23/24 and continue to 01/20/25 Please decrease Prednisone  to 5mg  daily on 01/21/25 and continue until you see the endocrinologist.

## 2024-10-26 LAB — IGG, IGA, IGM
IgG (Immunoglobin G), Serum: 456 mg/dL — ABNORMAL LOW (ref 600–1540)
IgM, Serum: 95 mg/dL (ref 50–300)
Immunoglobulin A: 85 mg/dL (ref 70–320)

## 2024-11-17 ENCOUNTER — Ambulatory Visit (HOSPITAL_COMMUNITY)
Admission: RE | Admit: 2024-11-17 | Discharge: 2024-11-17 | Disposition: A | Source: Ambulatory Visit | Attending: Urology | Admitting: Urology

## 2024-11-17 ENCOUNTER — Ambulatory Visit: Admitting: Urology

## 2024-11-17 VITALS — BP 113/71 | HR 75

## 2024-11-17 DIAGNOSIS — N200 Calculus of kidney: Secondary | ICD-10-CM

## 2024-11-17 LAB — URINALYSIS, ROUTINE W REFLEX MICROSCOPIC
Bilirubin, UA: NEGATIVE
Ketones, UA: NEGATIVE
Nitrite, UA: NEGATIVE
Protein,UA: NEGATIVE
Specific Gravity, UA: 1.02 (ref 1.005–1.030)
Urobilinogen, Ur: 0.2 mg/dL (ref 0.2–1.0)
pH, UA: 5.5 (ref 5.0–7.5)

## 2024-11-17 LAB — MICROSCOPIC EXAMINATION: Bacteria, UA: NONE SEEN

## 2024-11-17 NOTE — Progress Notes (Unsigned)
 "  11/17/2024 2:20 PM   Joy Leach 1955/12/21 [id number]  Referring provider: Francis Alan CROME, FNP [address] [place],  [place] [address]  No chief complaint on file.   HPI:    PMH: Past Medical History:  Diagnosis Date   Anemia    Basal cell carcinoma (BCC) of skin of face 10/2023   left side   Chronic kidney disease    stage 3   Diabetes mellitus without complication (HCC)    Dyspnea    Fatty (change of) liver, not elsewhere classified    H/O blood clots    Headache    History of hypertension    Hyperlipidemia    Hypertension    Lupus    Mycobacterial disease    Osteoarthritis    Osteopenia    Peripheral vascular disease    Pneumonia    Raynaud disease    Sicca     Surgical History: Past Surgical History:  Procedure Laterality Date   ABDOMINAL HYSTERECTOMY     BIOPSY  10/26/2021   Procedure: BIOPSY;  Surgeon: Eartha Angelia Sieving, MD;  [facility];  Service: Gastroenterology;;   BIOPSY  04/02/2022   Procedure: BIOPSY;  Surgeon: Eartha Angelia Sieving, MD;  [facility];  Service: Gastroenterology;;   CHOLECYSTECTOMY N/A 03/07/2023   Procedure: LAPAROSCOPIC CHOLECYSTECTOMY;  Surgeon: Evonnie Dorothyann LABOR, DO;  [facility];  Service: General;  Laterality: N/A;   COLONOSCOPY  05/26/2015   Dr donnel, mild diverticulosis in sigmoid colon, biosies taken from [facility] and sigmoid colon-negative   COLONOSCOPY WITH PROPOFOL  N/A 04/02/2022   Procedure: COLONOSCOPY WITH PROPOFOL ;  Surgeon: Eartha Angelia Sieving, MD;  [facility];  Service: Gastroenterology;  Laterality: N/A;  815   CYSTOSCOPY WITH RETROGRADE PYELOGRAM, URETEROSCOPY AND STENT PLACEMENT Right 01/30/2023   Procedure: CYSTOSCOPY WITH RETROGRADE PYELOGRAM, URETEROSCOPY AND STENT PLACEMENT;  Surgeon: Sherrilee Belvie CROME, MD;  [facility];  Service: Urology;  Laterality: Right;   ESOPHAGOGASTRODUODENOSCOPY (EGD) WITH PROPOFOL  N/A 10/26/2021    Procedure: ESOPHAGOGASTRODUODENOSCOPY (EGD) WITH PROPOFOL ;  Surgeon: Eartha Angelia Sieving, MD;  [facility];  Service: Gastroenterology;  Laterality: N/A;  1045   EXTRACORPOREAL SHOCK WAVE LITHOTRIPSY Right 12/03/2022   Procedure: EXTRACORPOREAL SHOCK WAVE LITHOTRIPSY (ESWL);  Surgeon: Sherrilee Belvie CROME, MD;  [facility];  Service: Urology;  Laterality: Right;   EYE SURGERY     HEMOSTASIS CLIP PLACEMENT  10/26/2021   Procedure: HEMOSTASIS CLIP PLACEMENT;  Surgeon: Eartha Angelia Sieving, MD;  [facility];  Service: Gastroenterology;;   HOLMIUM LASER APPLICATION Right 01/30/2023   Procedure: HOLMIUM LASER APPLICATION;  Surgeon: Sherrilee Belvie CROME, MD;  [facility];  Service: Urology;  Laterality: Right;   HOT HEMOSTASIS  10/26/2021   Procedure: HOT HEMOSTASIS (ARGON PLASMA COAGULATION/BICAP);  Surgeon: Eartha Angelia, Sieving, MD;  [facility];  Service: Gastroenterology;;   hystorectomy     PARATHYROIDECTOMY Right 05/31/2024   Procedure: PARATHYROIDECTOMY;  Surgeon: Eletha Boas, MD;  [facility];  Service: General;  Laterality: Right;  RIGHT INFERIOR PARATHYROIDECTOMY   POLYPECTOMY  10/26/2021   Procedure: POLYPECTOMY;  Surgeon: Eartha Angelia Sieving, MD;  [facility];  Service: Gastroenterology;;   POLYPECTOMY  04/02/2022   Procedure: POLYPECTOMY;  Surgeon: Eartha Angelia Sieving, MD;  [facility];  Service: Gastroenterology;;   SINUS SURGERY WITH INSTATRAK  2015   SKIN BIOPSY      Home Medications:  Allergies as of 11/17/2024  Reactions   Sulfa Antibiotics Hives   Caffeine Other (See Comments)   Bladder infections   Codeine Nausea And Vomiting        Medication List        Accurate as of November 17, 2024  2:20 PM. If you have any questions, ask your nurse or doctor.          acetaminophen  500 MG tablet Commonly known as: TYLENOL  Take 2 tablets (1,000 mg total) by mouth  every 8 (eight) hours as needed for mild pain, fever or headache.   apixaban  5 MG Tabs tablet Commonly known as: ELIQUIS  Take 1 tablet (5 mg total) by mouth 2 (two) times daily. To be resume at the moment of discharge and after all procedures completed.   artificial tears ophthalmic solution Place 1 drop into both eyes as needed for dry eyes.   azelastine 0.1 % nasal spray Commonly known as: ASTELIN Place 1 spray into both nostrils daily.   Cinnamon 500 MG capsule Take 500 mg by mouth daily.   cyclobenzaprine 10 MG tablet Commonly known as: FLEXERIL Take 10 mg by mouth as needed.   dicyclomine  10 MG capsule Commonly known as: BENTYL  Take 1 capsule (10 mg total) by mouth every 8 (eight) hours as needed (abdominal pain or diarrhea).   ezetimibe 10 MG tablet Commonly known as: ZETIA Take 10 mg by mouth daily.   fenofibrate 145 MG tablet Commonly known as: TRICOR Take 145 mg by mouth every evening.   ferrous sulfate  325 (65 FE) MG EC tablet Take 1 tablet (325 mg total) by mouth daily with breakfast. Resume at time of discharge   fluticasone 50 MCG/ACT nasal spray Commonly known as: FLONASE Place 2 sprays into both nostrils daily.   furosemide  20 MG tablet Commonly known as: LASIX  TAKE (1) TABLET BY MOUTH ONCE DAILY AS NEEDED.   hydrocortisone  2.5 % rectal cream Commonly known as: ANUSOL -HC Place 1 Application rectally 4 (four) times daily. Use four times per day x10 days then PRN thereafter   ketoconazole 2 % shampoo Commonly known as: NIZORAL Apply topically.   levocetirizine 5 MG tablet Commonly known as: XYZAL Take 5 mg by mouth every evening.   loperamide 2 MG tablet Commonly known as: IMODIUM A-D Take 2 mg by mouth 2 (two) times daily as needed for diarrhea or loose stools.   metFORMIN 1000 MG tablet Commonly known as: GLUCOPHAGE Take 1,000 mg by mouth 2 (two) times daily.   montelukast 10 MG tablet Commonly known as: SINGULAIR Take 10 mg by mouth  daily.   multivitamin tablet Take 1 tablet by mouth in the morning.   ondansetron  4 MG tablet Commonly known as: ZOFRAN  Take 1 tablet (4 mg total) by mouth every 8 (eight) hours as needed for nausea or vomiting.   pantoprazole  40 MG tablet Commonly known as: PROTONIX  TAKE ONE TABLET BY MOUTH EVERY DAY   pioglitazone 15 MG tablet Commonly known as: ACTOS Take 15 mg by mouth daily.   predniSONE  5 MG Tbec Take 5 mg by mouth daily with breakfast.   predniSONE  1 MG tablet Commonly known as: DELTASONE  Take 9 tablets (9 mg total) by mouth daily with breakfast.   promethazine -dextromethorphan 6.25-15 MG/5ML syrup Commonly known as: PROMETHAZINE -DM Take 5 mLs by mouth every 6 (six) hours as needed.   QC TUMERIC COMPLEX PO Take 1 capsule by mouth daily.        Allergies: Allergies[1]  Family History: Family History  Problem Relation Age of  Onset   Hypertension Mother    Diabetes Mother    Kidney disease Mother    Heart disease Mother    High Cholesterol Mother    Lung cancer Mother    Kidney Stones Father    Osteoarthritis Sister    Hypertension Sister    Hyperlipidemia Sister    Diabetes Sister    Fibromyalgia Sister    Hypertension Sister    Kidney disease Sister    Kidney disease Sister    Hypertension Sister    Lupus Sister    Cirrhosis Sister    Diabetes Maternal Grandmother    Diabetes Paternal Grandmother    Cirrhosis Paternal Grandmother    Diabetes Paternal Grandfather    Diabetes Son     Social History:  reports that she has never smoked. She has been exposed to tobacco smoke. She has never used smokeless tobacco. She reports that she does not drink alcohol  and does not use drugs.  ROS: All other review of systems were reviewed and are negative except what is noted above in HPI  Physical Exam: BP 113/71   Pulse 75   Constitutional:  Alert and oriented, No acute distress. HEENT: [place] AT, moist mucus membranes.  Trachea midline, no  masses. Cardiovascular: No clubbing, cyanosis, or edema. Respiratory: Normal respiratory effort, no increased work of breathing. GI: Abdomen is soft, nontender, nondistended, no abdominal masses GU: No CVA tenderness.  Lymph: No cervical or inguinal lymphadenopathy. Skin: No rashes, bruises or suspicious lesions. Neurologic: Grossly intact, no focal deficits, moving all 4 extremities. Psychiatric: Normal mood and affect.  Laboratory Data: Lab Results  Component Value Date   WBC 7.7 05/26/2024   HGB 12.4 05/26/2024   HCT 41.5 05/26/2024   MCV 92.0 05/26/2024   PLT 252 05/26/2024    Lab Results  Component Value Date   CREATININE 1.35 (H) 05/26/2024    No results found for: PSA  No results found for: TESTOSTERONE  Lab Results  Component Value Date   HGBA1C 5.8 (H) 05/26/2024    Urinalysis    Component Value Date/Time   COLORURINE YELLOW 03/19/2024 0630   APPEARANCEUR Clear 04/01/2024 0944   LABSPEC 1.018 03/19/2024 0630   PHURINE 8.0 03/19/2024 0630   GLUCOSEU Negative 04/01/2024 0944   HGBUR NEGATIVE 03/19/2024 0630   BILIRUBINUR Negative 04/01/2024 0944   KETONESUR NEGATIVE 03/19/2024 0630   PROTEINUR Negative 04/01/2024 0944   PROTEINUR 30 (A) 03/19/2024 0630   NITRITE Negative 04/01/2024 0944   NITRITE NEGATIVE 03/19/2024 0630   LEUKOCYTESUR Negative 04/01/2024 0944   LEUKOCYTESUR TRACE (A) 03/19/2024 0630    Lab Results  Component Value Date   LABMICR See below: 04/01/2024   WBCUA 0-5 04/01/2024   LABEPIT 0-10 04/01/2024   BACTERIA None seen 04/01/2024    Pertinent Imaging: *** Results for orders placed during the hospital encounter of 04/01/24  DG Abd 1 View  Narrative CLINICAL DATA:  Kidney stones.  EXAM: ABDOMEN - 1 VIEW  COMPARISON:  CT 11/29/2023  FINDINGS: Calcifications projecting over the lower left renal shadow correspond to intrarenal calculi on prior CT. No evidence of ureteral stone. Moderate colonic stool burden. No  evidence of obstruction.  IMPRESSION: Left nephrolithiasis.   Electronically Signed By: Andrea Gasman M.D. On: 04/09/2024 20:41  No results found for this or any previous visit.  No results found for this or any previous visit.  No results found for this or any previous visit.  No results found for this or any previous  visit.  No results found for this or any previous visit.  No results found for this or any previous visit.  Results for orders placed during the hospital encounter of 03/05/23  CT Renal Stone Study  Narrative CLINICAL DATA:  Abdominal/flank pain, stone suspected. Persistent left flank pain for 1 week. Recent kidney stone surgery on right side 1 month ago.  EXAM: CT ABDOMEN AND PELVIS WITHOUT CONTRAST  TECHNIQUE: Multidetector CT imaging of the abdomen and pelvis was performed following the standard protocol without IV contrast.  RADIATION DOSE REDUCTION: This exam was performed according to the departmental dose-optimization program which includes automated exposure control, adjustment of the mA and/or kV according to patient size and/or use of iterative reconstruction technique.  COMPARISON:  11/27/2022.  FINDINGS: Lower chest: Heart is enlarged and coronary artery calcifications are noted. Strandy atelectasis is present at the lung bases.  Hepatobiliary: No focal liver abnormality is seen. No intrahepatic biliary ductal dilatation. The common bile duct measures 8 mm and there is a 5 mm calculus in the region of the ampulla. Stones are present within the gallbladder and there is gallbladder wall thickening measuring up to 4 mm. There is a questionable stone in the cystic duct.  Pancreas: Unremarkable. No pancreatic ductal dilatation or surrounding inflammatory changes.  Spleen: Normal in size without focal abnormality.  Adrenals/Urinary Tract: No adrenal nodule or mass. Multiple renal calculi are noted bilaterally measuring up to 3 mm  on the left. No ureteral calculus or obstructive uropathy bilaterally. The bladder is unremarkable.  Stomach/Bowel: A linear radiopaque density is noted in the stomach measuring 1.8 cm. Appendix appears normal. No evidence of bowel wall thickening, distention, or inflammatory changes. No free air or pneumatosis.  Vascular/Lymphatic: Aortic atherosclerosis. Prominent lymph nodes are present at the porta hepatis, which may be reactive.  Reproductive: Status post hysterectomy. No adnexal masses.  Other: No abdominopelvic ascites. A fat containing umbilical hernia is noted.  Musculoskeletal: Degenerative changes are present in the thoracolumbar spine. No acute osseous abnormality.  IMPRESSION: 1. Bilateral nephrolithiasis without evidence of obstructive uropathy. 2. Cholelithiasis with gallbladder wall thickening. There is a questionable stone in the cystic duct. There is distention of the common bile duct with a 5 mm calcification in the region of the ampulla. Ultrasound is recommended for further evaluation. 3. Linear radiopaque density in the stomach. Correlate clinically to exclude foreign body. 4. Aortic atherosclerosis and coronary artery calcifications.   Electronically Signed By: Leita Birmingham M.D. On: 03/06/2023 00:33   Assessment & Plan:    1. Kidney stones (Primary) *** - Urinalysis, Routine w reflex microscopic   No follow-ups on file.  Belvie Clara, MD  [facility] [place]      [1]  Allergies Allergen Reactions   Sulfa Antibiotics Hives   Caffeine Other (See Comments)    Bladder infections   Codeine Nausea And Vomiting   "

## 2024-11-17 NOTE — Patient Instructions (Signed)
 Preventing Kidney Stones: Eating Plan Kidney stones are deposits of minerals and salts that form inside your kidneys. Your risk of developing kidney stones may be greater depending on your diet, your lifestyle, the medicines you take, and whether you have certain medical conditions. Most people can lower their risks of developing kidney stones by following these dietary guidelines. Your dietitian may give you more specific instructions depending on your overall health and the type of kidney stones you tend to develop. What are tips for following this plan? Reading food labels  Choose foods with no salt added or low-salt labels. Limit your salt (sodium) intake to less than 1,500 mg a day. Choose foods with calcium for each meal and snack. Try to eat about 300 mg of calcium at each meal. Foods that contain 200-500 mg of calcium a serving include: 8 oz (237 mL) of milk, calcium-fortifiednon-dairy milk, and calcium-fortifiedfruit juice. Calcium-fortified means that calcium has been added to these drinks. 8 oz (237 mL) of kefir, yogurt, and soy yogurt. 4 oz (114 g) of tofu. 1 oz (28 g) of cheese. 1 cup (150 g) of dried figs. 1 cup (91 g) of cooked broccoli. One 3 oz (85 g) can of sardines or mackerel. Most people need 1,000-1,500 mg of calcium a day. Talk to your dietitian about how much calcium is recommended for you. Shopping Buy plenty of fresh fruits and vegetables. Most people do not need to avoid fruits and vegetables, even if these foods contain nutrients that may contribute to kidney stones. When shopping for convenience foods, choose: Whole pieces of fruit. Pre-made salads with dressing on the side. Low-fat fruit and yogurt smoothies. Avoid buying frozen meals or prepared deli foods. These can be high in sodium. Look for foods with live cultures, such as yogurt and kefir. Choose high-fiber grains, such as whole-wheat breads, oat bran, and wheat cereals. Cooking Do not add salt to  food when cooking. Place a salt shaker on the table and allow each person to add their own salt to taste. Use vegetable protein, such as beans, textured vegetable protein (TVP), or tofu, instead of meat in pasta, casseroles, and soups. Meal planning Eat less salt, if told by your dietitian. To do this: Avoid eating processed or pre-made food. Avoid eating fast food. Eat less animal protein, including cheese, meat, poultry, or fish, if told by your dietitian. To do this: Limit the number of times you have meat, poultry, fish, or cheese each week. Eat a diet free of meat at least 2 days a week. Eat only one serving each day of meat, poultry, fish, or seafood. When you prepare animal proteins, cut pieces into small portion sizes. For most meat and fish, one serving is about the size of the palm of your hand. Eat at least five servings of fresh fruits and vegetables each day. To do this: Keep fruits and vegetables on hand for snacks. Eat one piece of fruit or a handful of berries with breakfast. Have a salad and fruit at lunch. Have two kinds of vegetables at dinner. You may be told to limit foods that are high in a substance called oxalate. These include: Spinach (cooked), rhubarb, beets, sweet potatoes, and Swiss chard. Peanuts. Potato chips, french fries, and baked potatoes with skin on. Nuts and nut products. Chocolate. If you regularly take a diuretic medicine, make sure to eat at least 1 or 2 servings of fruits or vegetables that are high in potassium each day. These include: Avocado. Banana. Orange,  prune, carrot, or tomato juice. Baked potato. Cabbage. Beans and split peas. Lifestyle  Drink enough fluid to keep your urine pale yellow. This is the most important thing you can do. Spread your fluid intake throughout the day. If you drink alcohol: Limit how much you have to: 0-1 drink a day for women who are not pregnant. 0-2 drinks a day for men. Know how much alcohol is in your  drink. In the [place] one drink equals one 12 oz bottle of beer (355 mL), one 5 oz glass of wine (148 mL), or one 1 oz glass of hard liquor (44 mL). Lose weight if told by your health care provider. Work with your dietitian to find an eating plan and weight loss strategies that work best for you. General information Talk to your health care provider and dietitian about taking daily supplements. Depending on your health and the cause of your kidney stones, you may be told: Do not take high-dose supplements of vitamin C (1,000 mg a day or more). To take a calcium supplement. To take a daily probiotic supplement. To take other supplements such as magnesium, fish oil, or vitamin B6. Take over-the-counter and prescription medicines only as told by your health care provider. These include supplements. What foods should I limit? Limit your intake of the following foods, or eat them as told by your dietitian. Vegetables Spinach. Rhubarb. Beets. Canned vegetables. Dene. Olives. Baked potatoes with skin. Grains Wheat bran. Baked goods. Salted crackers. Cereals high in sugar. Meats and other proteins Nuts. Nut butters. Large portions of meat, poultry, or fish. Salted, precooked, or cured meats, such as sausages, meat loaves, and hot dogs. Dairy Cheeses. Beverages Regular soft drinks. Regular vegetable juice. Seasonings and condiments Seasoning blends with salt. Salad dressings. Soy sauce. Ketchup. Barbecue sauce. Other foods Canned soups. Canned pasta sauce. Casseroles. Pizza. Lasagna. Frozen meals. Potato chips. French fries. The items listed above may not be a complete list of foods and beverages you should limit. Contact a dietitian for more information. What foods should I avoid? Talk to your dietitian about specific foods you should avoid based on the type of kidney stones you have and your overall health. Fruits Grapefruit. The item listed above may not be a complete list of foods and  beverages you should avoid. Contact a dietitian for more information. Summary Kidney stones are deposits of minerals and salts that form inside your kidneys. You can lower your risk of kidney stones by making changes to your diet. The most important thing you can do is drink enough fluid. Drink enough fluid to keep your urine pale yellow. Talk to your dietitian about how much calcium you should have each day, and eat less salt and animal protein as told by your dietitian. This information is not intended to replace advice given to you by your health care provider. Make sure you discuss any questions you have with your health care provider. Document Revised: 08/08/2024 Document Reviewed: 01/10/2022 [employer] Patient Education  2025 [employer].

## 2024-11-22 ENCOUNTER — Ambulatory Visit (INDEPENDENT_AMBULATORY_CARE_PROVIDER_SITE_OTHER): Admitting: Gastroenterology

## 2024-11-26 ENCOUNTER — Ambulatory Visit: Admitting: Allergy & Immunology

## 2025-04-25 ENCOUNTER — Ambulatory Visit

## 2025-07-14 ENCOUNTER — Ambulatory Visit: Admitting: "Endocrinology

## 2025-11-18 ENCOUNTER — Ambulatory Visit: Admitting: Urology

## 2025-11-21 ENCOUNTER — Ambulatory Visit: Admitting: Urology
